# Patient Record
Sex: Female | Born: 1955 | Race: Black or African American | Hispanic: No | Smoking: Never smoker
Health system: Southern US, Community
[De-identification: ages and names within clinical notes are randomized; demographics above are authoritative.]

## PROBLEM LIST (undated history)

## (undated) DIAGNOSIS — I10 Essential (primary) hypertension: Secondary | ICD-10-CM

## (undated) DIAGNOSIS — E785 Hyperlipidemia, unspecified: Secondary | ICD-10-CM

## (undated) DIAGNOSIS — K219 Gastro-esophageal reflux disease without esophagitis: Secondary | ICD-10-CM

## (undated) DIAGNOSIS — D539 Nutritional anemia, unspecified: Secondary | ICD-10-CM

## (undated) DIAGNOSIS — E669 Obesity, unspecified: Secondary | ICD-10-CM

## (undated) HISTORY — DX: Hyperlipidemia, unspecified: E78.5

## (undated) HISTORY — DX: Essential (primary) hypertension: I10

## (undated) HISTORY — DX: Gastro-esophageal reflux disease without esophagitis: K21.9

## (undated) HISTORY — DX: Nutritional anemia, unspecified: D53.9

## (undated) HISTORY — DX: Obesity, unspecified: E66.9

---

## 2000-12-03 HISTORY — PX: SPLENECTOMY: SUR1306

## 2002-06-03 ENCOUNTER — Encounter: Payer: Self-pay | Admitting: Family Medicine

## 2002-06-03 ENCOUNTER — Ambulatory Visit (HOSPITAL_COMMUNITY): Admission: RE | Admit: 2002-06-03 | Discharge: 2002-06-03 | Payer: Self-pay | Admitting: Family Medicine

## 2003-11-05 ENCOUNTER — Ambulatory Visit (HOSPITAL_COMMUNITY): Admission: RE | Admit: 2003-11-05 | Discharge: 2003-11-05 | Payer: Self-pay | Admitting: Family Medicine

## 2003-12-04 HISTORY — PX: OTHER SURGICAL HISTORY: SHX169

## 2003-12-29 ENCOUNTER — Ambulatory Visit (HOSPITAL_COMMUNITY): Admission: RE | Admit: 2003-12-29 | Discharge: 2003-12-29 | Payer: Self-pay | Admitting: Family Medicine

## 2004-06-16 ENCOUNTER — Emergency Department (HOSPITAL_COMMUNITY): Admission: EM | Admit: 2004-06-16 | Discharge: 2004-06-16 | Payer: Self-pay | Admitting: Emergency Medicine

## 2004-10-20 ENCOUNTER — Ambulatory Visit: Payer: Self-pay | Admitting: Cardiology

## 2004-10-25 ENCOUNTER — Ambulatory Visit: Payer: Self-pay | Admitting: Family Medicine

## 2004-11-01 ENCOUNTER — Emergency Department (HOSPITAL_COMMUNITY): Admission: EM | Admit: 2004-11-01 | Discharge: 2004-11-01 | Payer: Self-pay | Admitting: Emergency Medicine

## 2004-12-26 ENCOUNTER — Ambulatory Visit: Payer: Self-pay | Admitting: Family Medicine

## 2005-01-08 ENCOUNTER — Ambulatory Visit (HOSPITAL_COMMUNITY): Admission: RE | Admit: 2005-01-08 | Discharge: 2005-01-08 | Payer: Self-pay | Admitting: Family Medicine

## 2005-02-12 ENCOUNTER — Ambulatory Visit: Payer: Self-pay | Admitting: Family Medicine

## 2005-02-21 ENCOUNTER — Ambulatory Visit: Payer: Self-pay | Admitting: Cardiology

## 2005-02-28 ENCOUNTER — Ambulatory Visit: Payer: Self-pay | Admitting: Cardiovascular Disease

## 2005-02-28 ENCOUNTER — Inpatient Hospital Stay (HOSPITAL_COMMUNITY): Admission: EM | Admit: 2005-02-28 | Discharge: 2005-03-02 | Payer: Self-pay | Admitting: Emergency Medicine

## 2005-06-18 ENCOUNTER — Ambulatory Visit: Payer: Self-pay | Admitting: Family Medicine

## 2005-06-22 ENCOUNTER — Ambulatory Visit (HOSPITAL_COMMUNITY): Admission: RE | Admit: 2005-06-22 | Discharge: 2005-06-22 | Payer: Self-pay | Admitting: Family Medicine

## 2005-06-26 ENCOUNTER — Ambulatory Visit: Payer: Self-pay | Admitting: Family Medicine

## 2005-07-23 ENCOUNTER — Ambulatory Visit: Payer: Self-pay | Admitting: Orthopedic Surgery

## 2005-09-13 ENCOUNTER — Ambulatory Visit: Payer: Self-pay | Admitting: Family Medicine

## 2005-11-06 ENCOUNTER — Ambulatory Visit: Payer: Self-pay | Admitting: Family Medicine

## 2005-12-03 HISTORY — PX: OTHER SURGICAL HISTORY: SHX169

## 2006-02-05 ENCOUNTER — Ambulatory Visit: Payer: Self-pay | Admitting: Family Medicine

## 2006-05-03 ENCOUNTER — Ambulatory Visit (HOSPITAL_COMMUNITY): Admission: RE | Admit: 2006-05-03 | Discharge: 2006-05-03 | Payer: Self-pay | Admitting: Family Medicine

## 2006-05-28 ENCOUNTER — Ambulatory Visit: Payer: Self-pay | Admitting: Family Medicine

## 2006-05-29 ENCOUNTER — Ambulatory Visit (HOSPITAL_COMMUNITY): Admission: RE | Admit: 2006-05-29 | Discharge: 2006-05-29 | Payer: Self-pay | Admitting: Family Medicine

## 2006-08-28 ENCOUNTER — Ambulatory Visit: Payer: Self-pay | Admitting: Family Medicine

## 2007-02-04 ENCOUNTER — Ambulatory Visit: Payer: Self-pay | Admitting: Family Medicine

## 2007-02-05 ENCOUNTER — Encounter: Payer: Self-pay | Admitting: Family Medicine

## 2007-02-05 LAB — CONVERTED CEMR LAB
AST: 16 units/L (ref 0–37)
Albumin: 3.6 g/dL (ref 3.5–5.2)
Alkaline Phosphatase: 85 units/L (ref 39–117)
BUN: 12 mg/dL (ref 6–23)
Bilirubin, Direct: 0.1 mg/dL (ref 0.0–0.3)
CO2: 24 meq/L (ref 19–32)
Calcium: 8.8 mg/dL (ref 8.4–10.5)
Chloride: 107 meq/L (ref 96–112)
Creatinine, Ser: 0.57 mg/dL (ref 0.40–1.20)
Eosinophils Relative: 1 % (ref 0–5)
Glucose, Bld: 105 mg/dL — ABNORMAL HIGH (ref 70–99)
HCT: 34.4 % — ABNORMAL LOW (ref 36.0–46.0)
HDL: 40 mg/dL (ref >39)
Hemoglobin: 10.7 g/dL — ABNORMAL LOW (ref 12.0–15.0)
Indirect Bilirubin: 0.1 mg/dL (ref 0.0–0.9)
LDL Cholesterol: 65 mg/dL (ref 0–99)
Lymphocytes Relative: 33 % (ref 12–46)
Lymphs Abs: 2.4 10*3/uL (ref 0.7–3.3)
Monocytes Absolute: 0.7 10*3/uL (ref 0.2–0.7)
Monocytes Relative: 10 % (ref 3–11)
Neutro Abs: 4.1 10*3/uL (ref 1.7–7.7)
RBC: 4.33 M/uL (ref 3.87–5.11)
Total Bilirubin: 0.2 mg/dL — ABNORMAL LOW (ref 0.3–1.2)

## 2007-02-06 ENCOUNTER — Encounter: Payer: Self-pay | Admitting: Family Medicine

## 2007-02-06 LAB — CONVERTED CEMR LAB
Iron: 31 ug/dL — ABNORMAL LOW (ref 42–145)
Retic Count, Absolute: 34.2 (ref 19.0–186.0)
Saturation Ratios: 9 % — ABNORMAL LOW (ref 20–55)
UIBC: 315 ug/dL

## 2007-02-07 ENCOUNTER — Ambulatory Visit (HOSPITAL_COMMUNITY): Admission: RE | Admit: 2007-02-07 | Discharge: 2007-02-07 | Payer: Self-pay | Admitting: Family Medicine

## 2007-02-21 ENCOUNTER — Ambulatory Visit (HOSPITAL_COMMUNITY): Admission: RE | Admit: 2007-02-21 | Discharge: 2007-02-21 | Payer: Self-pay | Admitting: Family Medicine

## 2007-03-05 ENCOUNTER — Encounter: Payer: Self-pay | Admitting: Family Medicine

## 2007-03-05 ENCOUNTER — Other Ambulatory Visit: Admission: RE | Admit: 2007-03-05 | Discharge: 2007-03-05 | Payer: Self-pay | Admitting: Family Medicine

## 2007-03-05 ENCOUNTER — Ambulatory Visit: Payer: Self-pay | Admitting: Family Medicine

## 2007-03-05 ENCOUNTER — Encounter (INDEPENDENT_AMBULATORY_CARE_PROVIDER_SITE_OTHER): Payer: Self-pay | Admitting: *Deleted

## 2007-03-06 ENCOUNTER — Encounter: Payer: Self-pay | Admitting: Family Medicine

## 2007-03-06 LAB — CONVERTED CEMR LAB
Chlamydia, DNA Probe: NEGATIVE
GC Probe Amp, Genital: NEGATIVE
Gardnerella vaginalis: NEGATIVE
Trichomonal Vaginitis: NEGATIVE

## 2007-03-07 ENCOUNTER — Ambulatory Visit (HOSPITAL_COMMUNITY): Admission: RE | Admit: 2007-03-07 | Discharge: 2007-03-07 | Payer: Self-pay | Admitting: Otolaryngology

## 2007-05-13 ENCOUNTER — Ambulatory Visit: Payer: Self-pay | Admitting: Family Medicine

## 2007-05-13 LAB — CONVERTED CEMR LAB
Folate: >20 ng/mL
Hemoglobin: 11.5 g/dL — ABNORMAL LOW (ref 12.0–15.0)
Iron: 84 ug/dL (ref 42–145)
Lymphs Abs: 3.4 10*3/uL — ABNORMAL HIGH (ref 0.7–3.3)
Monocytes Relative: 9 % (ref 3–11)
Neutro Abs: 5.5 10*3/uL (ref 1.7–7.7)
Neutrophils Relative %: 55 % (ref 43–77)
RBC: 4.52 M/uL (ref 3.87–5.11)
Retic Ct Pct: 0.8 % (ref 0.4–3.1)
Vitamin B-12: 843 pg/mL (ref 211–911)
WBC: 9.9 10*3/uL (ref 4.0–10.5)

## 2007-08-13 ENCOUNTER — Ambulatory Visit: Payer: Self-pay | Admitting: Family Medicine

## 2007-08-13 LAB — CONVERTED CEMR LAB
Basophils Absolute: 0 10*3/uL (ref 0.0–0.1)
Basophils Relative: 0 % (ref 0–1)
CO2: 23 meq/L (ref 19–32)
Calcium: 8.8 mg/dL (ref 8.4–10.5)
Creatinine, Ser: 0.66 mg/dL (ref 0.40–1.20)
Eosinophils Relative: 1 % (ref 0–5)
HCT: 34.9 % — ABNORMAL LOW (ref 36.0–46.0)
Hemoglobin: 11.4 g/dL — ABNORMAL LOW (ref 12.0–15.0)
Iron: 93 ug/dL (ref 42–145)
Lymphocytes Relative: 28 % (ref 12–46)
MCHC: 32.7 g/dL (ref 30.0–36.0)
MCV: 80.4 fL (ref 78.0–100.0)
Monocytes Absolute: 0.9 10*3/uL — ABNORMAL HIGH (ref 0.2–0.7)
RDW: 14.4 % — ABNORMAL HIGH (ref 11.5–14.0)
Retic Count, Absolute: 30.4 (ref 19.0–186.0)
Retic Ct Pct: 0.7 % (ref 0.4–3.1)
TIBC: 386 ug/dL (ref 250–470)
UIBC: 293 ug/dL

## 2007-10-31 IMAGING — US US SOFT TISSUE HEAD/NECK
1 series · 13 of 25 positions shown · non-contrast
Comparison: none

HISTORY: Reevaluate right thyroid nodules

[Series 1: us soft tissue head/neck · 0.09mm/px · 13 of 44 slices shown]
[im 1/44]
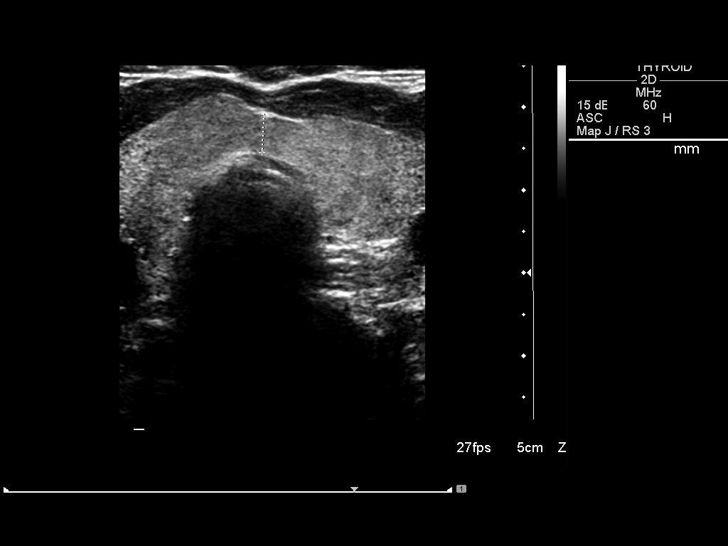
[im 4/44]
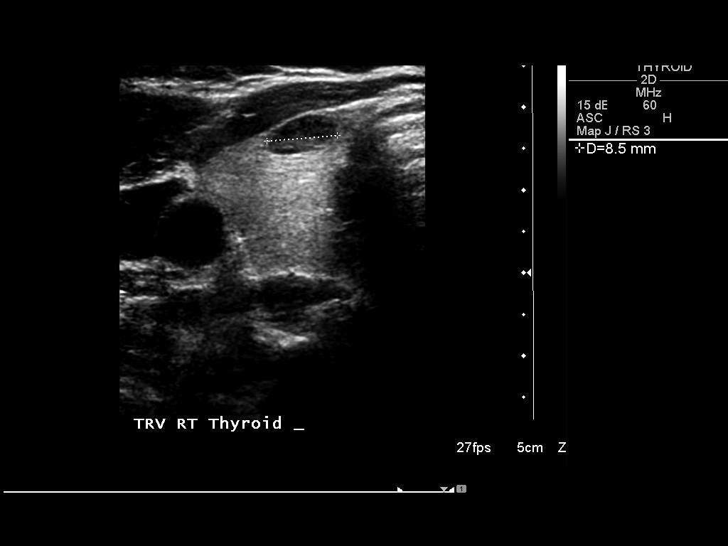
[im 8/44]
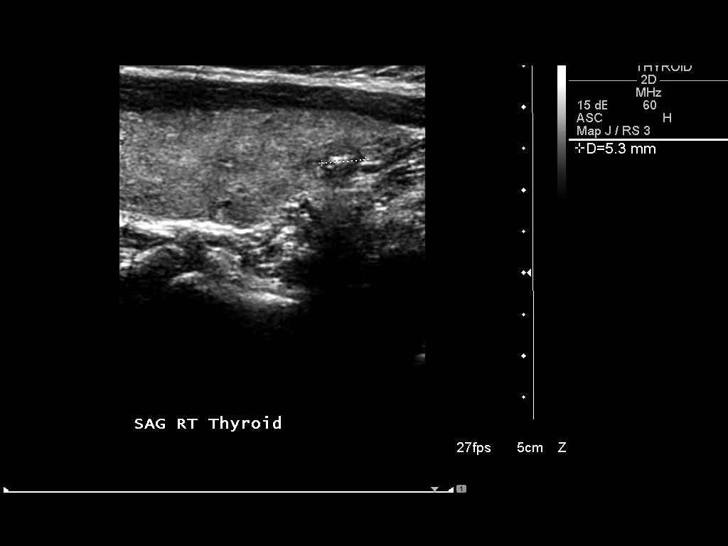
[im 11/44]
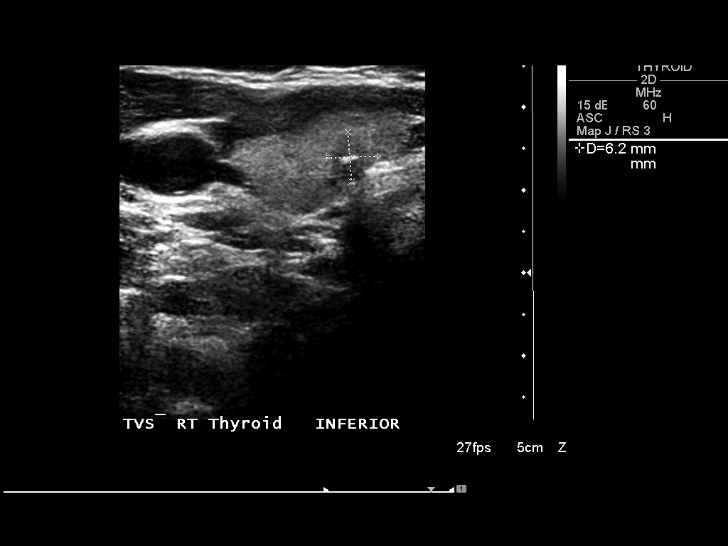
[im 15/44]
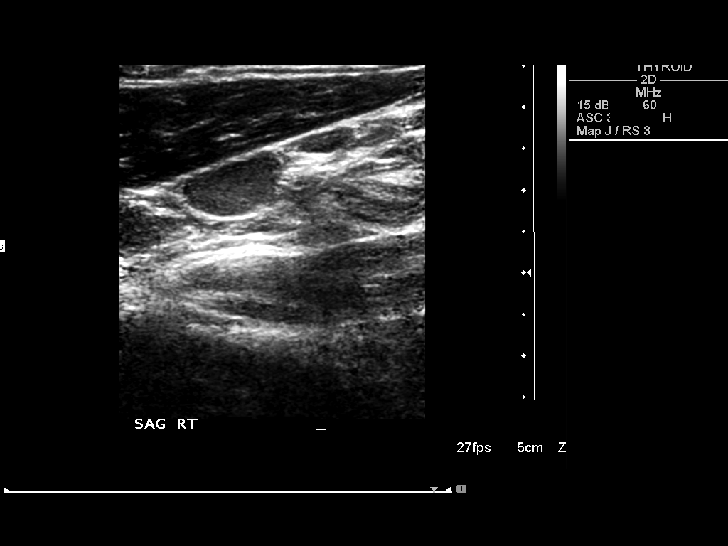
[im 18/44]
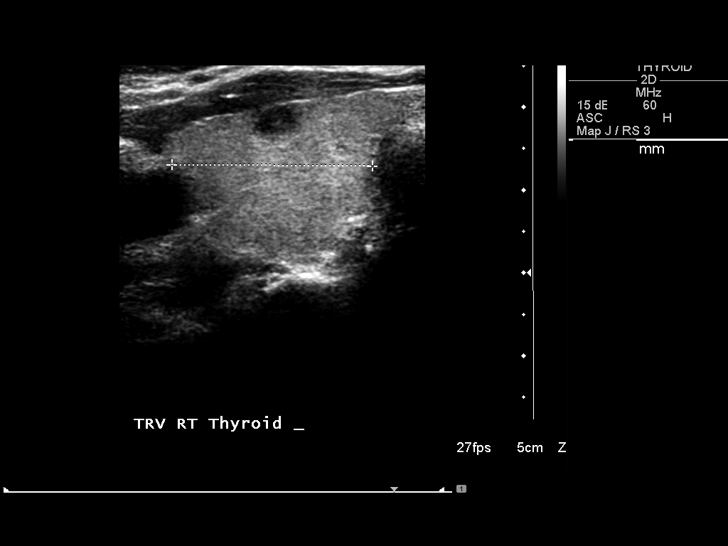
[im 22/44]
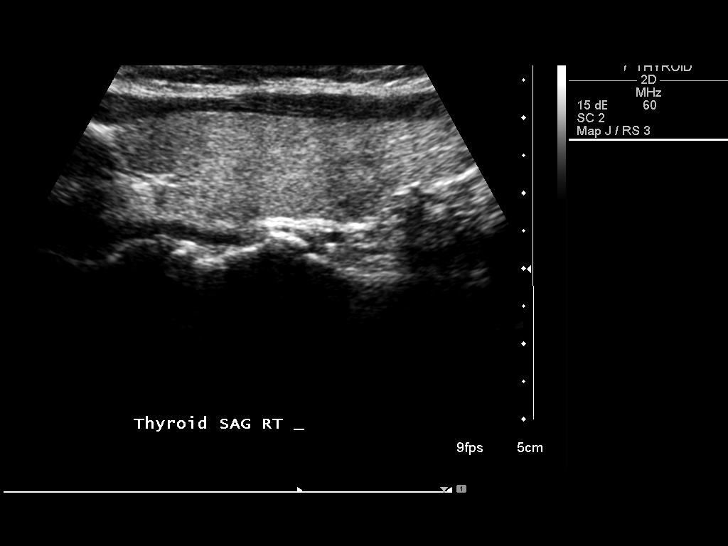
[im 26/44]
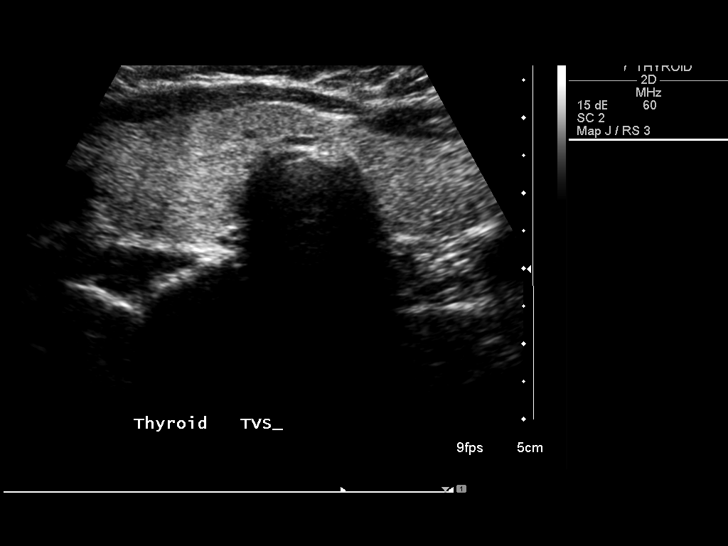
[im 29/44]
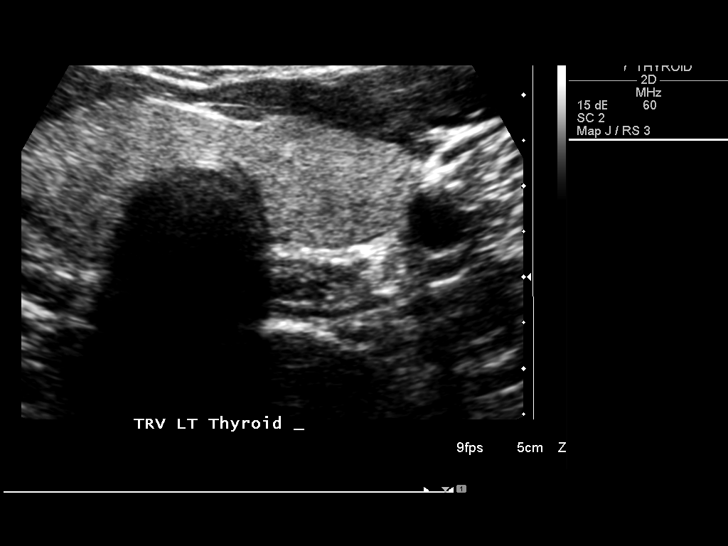
[im 33/44]
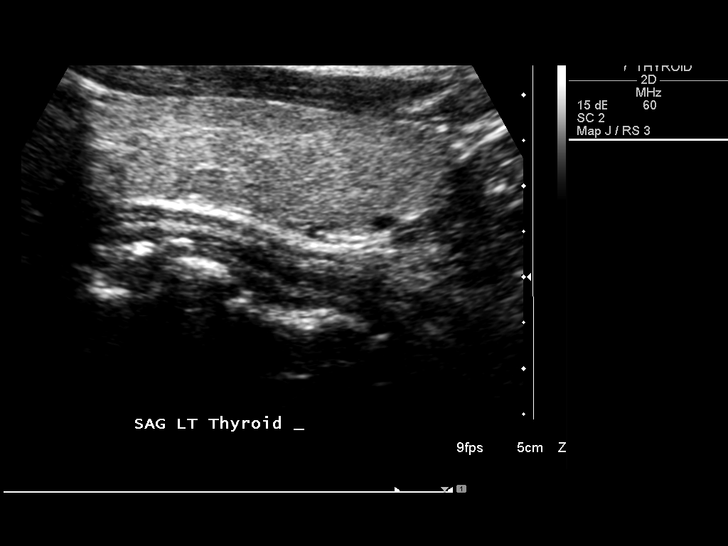
[im 36/44]
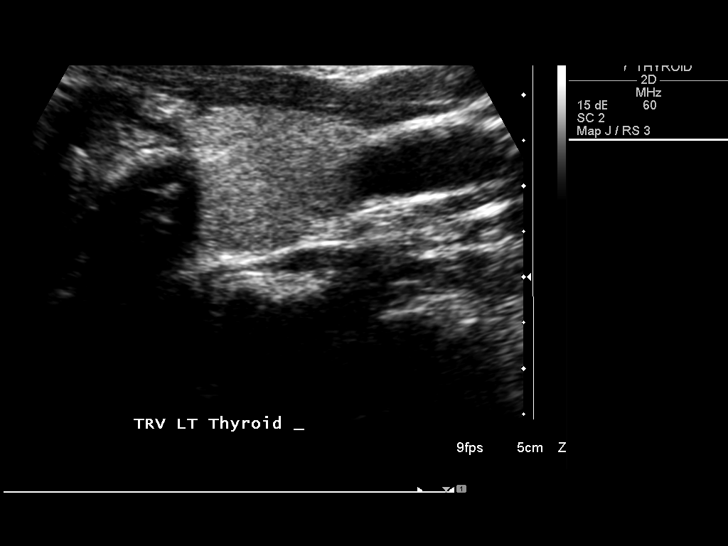
[im 40/44]
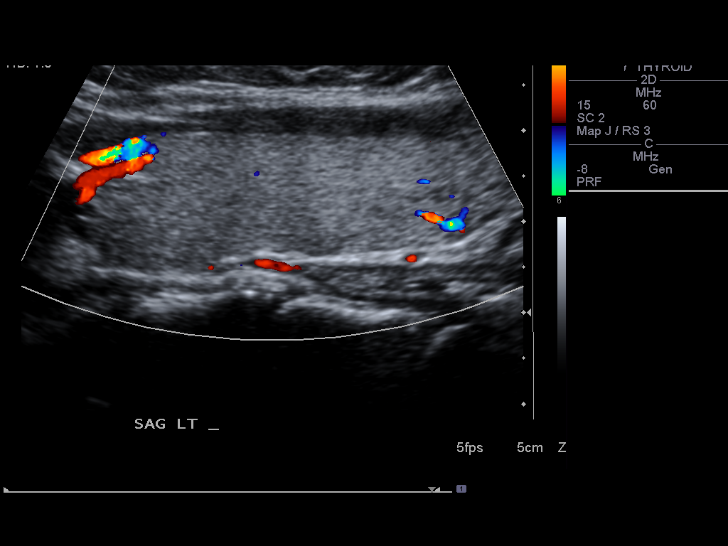
[im 44/44]
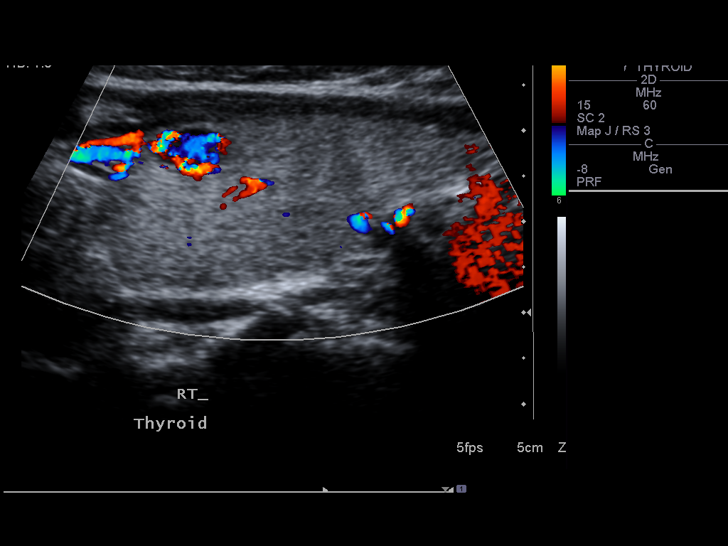

[13 of 25 positions shown; findings below may reference images not displayed]

THYROID ULTRASOUND:

Sonography of thyroid gland compared to 05/29/2006.

Right thyroid lobe 5.0 cm length x 1.9 cm AP x 2.4 cm transverse.
Left thyroid lobe 4.4 cm length x 1.4 cm AP x 1.9 cm transverse.
Thyroid isthmus 5 mm thick.
Thyroid echogenicity fairly homogeneous.
Small hypervascular nodule identified right thyroid lobe, 8 x 4 x 9 mm in size.
This appears relatively stable in size and echogenicity when compared to
previous exam.
Nodule remains hypervascular color Doppler imaging.

Additional tiny nodules noted in mid and inferior right thyroid lobe, 4 and 6 mm
in greatest sizes.
The 4 mm nodule appears stable at mid portion.
A 6 mm nodule, located at the inferior margin of the right lobe, was not
identified on the previous study though in retrospect there is questionably a
nodule at this site on a single image from the prior exam.
No left thyroid nodules identified.
No regional adenopathy.
IMPRESSION: 9 mm diameter hypervascular nodule right thyroid lobe, not significantly
changed.
4 mm diameter hypoechoic nodule right lobe, no significant change.
6 mm diameter hypoechoic nodule with small hyperechoic central focus noted at
inferior margin of right thyroid lobe, not identified on previous exam but in
retrospect questionably present on a single image.
Continued surveillance imaging recommended to establish long-term stability.

## 2007-11-18 ENCOUNTER — Ambulatory Visit: Payer: Self-pay | Admitting: Family Medicine

## 2007-11-19 ENCOUNTER — Encounter: Payer: Self-pay | Admitting: Family Medicine

## 2007-12-04 ENCOUNTER — Encounter: Payer: Self-pay | Admitting: Family Medicine

## 2008-01-13 ENCOUNTER — Ambulatory Visit (HOSPITAL_COMMUNITY): Admission: RE | Admit: 2008-01-13 | Discharge: 2008-01-13 | Payer: Self-pay | Admitting: General Surgery

## 2008-02-17 ENCOUNTER — Ambulatory Visit: Payer: Self-pay | Admitting: Family Medicine

## 2008-02-27 ENCOUNTER — Encounter (INDEPENDENT_AMBULATORY_CARE_PROVIDER_SITE_OTHER): Payer: Self-pay | Admitting: *Deleted

## 2008-02-27 DIAGNOSIS — J301 Allergic rhinitis due to pollen: Secondary | ICD-10-CM | POA: Insufficient documentation

## 2008-02-27 DIAGNOSIS — I10 Essential (primary) hypertension: Secondary | ICD-10-CM | POA: Insufficient documentation

## 2008-02-27 DIAGNOSIS — E669 Obesity, unspecified: Secondary | ICD-10-CM | POA: Insufficient documentation

## 2008-02-27 DIAGNOSIS — D509 Iron deficiency anemia, unspecified: Secondary | ICD-10-CM | POA: Insufficient documentation

## 2008-02-27 DIAGNOSIS — E1165 Type 2 diabetes mellitus with hyperglycemia: Secondary | ICD-10-CM | POA: Insufficient documentation

## 2008-02-27 DIAGNOSIS — E119 Type 2 diabetes mellitus without complications: Secondary | ICD-10-CM | POA: Insufficient documentation

## 2008-02-27 DIAGNOSIS — K219 Gastro-esophageal reflux disease without esophagitis: Secondary | ICD-10-CM

## 2008-02-27 HISTORY — DX: Allergic rhinitis due to pollen: J30.1

## 2008-02-27 HISTORY — DX: Gastro-esophageal reflux disease without esophagitis: K21.9

## 2008-07-16 ENCOUNTER — Telehealth: Payer: Self-pay | Admitting: Family Medicine

## 2008-08-03 ENCOUNTER — Telehealth: Payer: Self-pay | Admitting: Family Medicine

## 2008-08-13 ENCOUNTER — Encounter: Payer: Self-pay | Admitting: Family Medicine

## 2008-09-01 ENCOUNTER — Ambulatory Visit: Payer: Self-pay | Admitting: Family Medicine

## 2008-09-01 LAB — CONVERTED CEMR LAB
Glucose, Bld: 132 mg/dL
Hgb A1c MFr Bld: 6.8 %

## 2008-11-09 ENCOUNTER — Encounter: Payer: Self-pay | Admitting: Family Medicine

## 2008-11-11 ENCOUNTER — Telehealth: Payer: Self-pay | Admitting: Family Medicine

## 2008-11-11 ENCOUNTER — Encounter: Payer: Self-pay | Admitting: Family Medicine

## 2008-12-28 ENCOUNTER — Ambulatory Visit: Payer: Self-pay | Admitting: Family Medicine

## 2008-12-28 ENCOUNTER — Telehealth: Payer: Self-pay | Admitting: Family Medicine

## 2008-12-28 LAB — CONVERTED CEMR LAB
BUN: 9 mg/dL (ref 6–23)
Chloride: 106 meq/L (ref 96–112)
Eosinophils Absolute: 0.1 10*3/uL (ref 0.0–0.7)
Glucose, Bld: 227 mg/dL
Glucose, Bld: 177 mg/dL — ABNORMAL HIGH (ref 70–99)
Hgb A1c MFr Bld: 7.2 %
Lymphocytes Relative: 23 % (ref 12–46)
Lymphs Abs: 2.7 10*3/uL (ref 0.7–4.0)
Monocytes Relative: 11 % (ref 3–12)
Neutro Abs: 7.5 10*3/uL (ref 1.7–7.7)
Neutrophils Relative %: 65 % (ref 43–77)
Platelets: 300 10*3/uL (ref 150–400)
Potassium: 4.1 meq/L (ref 3.5–5.3)
RBC: 4.83 M/uL (ref 3.87–5.11)
Sodium: 139 meq/L (ref 135–145)
TSH: 2.191 microintl units/mL (ref 0.350–4.50)
WBC: 11.5 10*3/uL — ABNORMAL HIGH (ref 4.0–10.5)

## 2008-12-29 ENCOUNTER — Encounter: Payer: Self-pay | Admitting: Family Medicine

## 2009-01-03 LAB — CONVERTED CEMR LAB
Candida species: NEGATIVE
Gardnerella vaginalis: POSITIVE — AB
Trichomonal Vaginitis: NEGATIVE

## 2009-03-23 ENCOUNTER — Encounter: Payer: Self-pay | Admitting: Family Medicine

## 2009-03-29 ENCOUNTER — Encounter: Payer: Self-pay | Admitting: Family Medicine

## 2009-05-18 ENCOUNTER — Other Ambulatory Visit: Admission: RE | Admit: 2009-05-18 | Discharge: 2009-05-18 | Payer: Self-pay | Admitting: Family Medicine

## 2009-05-18 ENCOUNTER — Ambulatory Visit: Payer: Self-pay | Admitting: Family Medicine

## 2009-05-18 ENCOUNTER — Encounter: Payer: Self-pay | Admitting: Family Medicine

## 2009-05-18 DIAGNOSIS — M79609 Pain in unspecified limb: Secondary | ICD-10-CM | POA: Insufficient documentation

## 2009-05-18 DIAGNOSIS — N938 Other specified abnormal uterine and vaginal bleeding: Secondary | ICD-10-CM | POA: Insufficient documentation

## 2009-05-18 DIAGNOSIS — N949 Unspecified condition associated with female genital organs and menstrual cycle: Secondary | ICD-10-CM

## 2009-05-18 LAB — CONVERTED CEMR LAB
Glucose, Bld: 219 mg/dL
OCCULT 1: NEGATIVE

## 2009-05-19 ENCOUNTER — Encounter: Payer: Self-pay | Admitting: Family Medicine

## 2009-05-19 LAB — CONVERTED CEMR LAB
Candida species: POSITIVE — AB
Chlamydia, DNA Probe: NEGATIVE
Gardnerella vaginalis: POSITIVE — AB
LDL Cholesterol: 54 mg/dL (ref 0–99)
Microalb Creat Ratio: 20 mg/g (ref 0.0–30.0)
Trichomonal Vaginitis: NEGATIVE

## 2009-05-25 ENCOUNTER — Ambulatory Visit (HOSPITAL_COMMUNITY): Admission: RE | Admit: 2009-05-25 | Discharge: 2009-05-25 | Payer: Self-pay | Admitting: Family Medicine

## 2009-07-04 ENCOUNTER — Encounter: Payer: Self-pay | Admitting: Family Medicine

## 2009-07-25 ENCOUNTER — Encounter: Payer: Self-pay | Admitting: Family Medicine

## 2009-08-17 ENCOUNTER — Telehealth: Payer: Self-pay | Admitting: Family Medicine

## 2009-08-24 ENCOUNTER — Ambulatory Visit: Payer: Self-pay | Admitting: Family Medicine

## 2009-08-24 LAB — CONVERTED CEMR LAB
Chloride: 102 meq/L (ref 96–112)
Glucose, Bld: 144 mg/dL
Potassium: 4.2 meq/L (ref 3.5–5.3)
Sodium: 140 meq/L (ref 135–145)

## 2009-09-07 ENCOUNTER — Telehealth: Payer: Self-pay | Admitting: Family Medicine

## 2009-09-13 ENCOUNTER — Encounter: Payer: Self-pay | Admitting: Family Medicine

## 2009-10-17 ENCOUNTER — Encounter: Payer: Self-pay | Admitting: Family Medicine

## 2009-11-04 ENCOUNTER — Ambulatory Visit: Payer: Self-pay | Admitting: Family Medicine

## 2009-11-04 LAB — CONVERTED CEMR LAB: Blood Glucose, Fasting: 192 mg/dL

## 2009-12-22 ENCOUNTER — Ambulatory Visit: Payer: Self-pay | Admitting: Family Medicine

## 2009-12-22 LAB — CONVERTED CEMR LAB
Bilirubin Urine: NEGATIVE
Blood in Urine, dipstick: NEGATIVE
Glucose, Urine, Semiquant: NEGATIVE
Protein, U semiquant: NEGATIVE
Specific Gravity, Urine: 1.025
pH: 6.5

## 2009-12-27 ENCOUNTER — Encounter: Payer: Self-pay | Admitting: Family Medicine

## 2009-12-29 ENCOUNTER — Ambulatory Visit: Payer: Self-pay | Admitting: Family Medicine

## 2010-03-03 ENCOUNTER — Emergency Department (HOSPITAL_COMMUNITY): Admission: EM | Admit: 2010-03-03 | Discharge: 2010-03-03 | Payer: Self-pay | Admitting: Emergency Medicine

## 2010-03-03 ENCOUNTER — Ambulatory Visit: Payer: Self-pay | Admitting: Internal Medicine

## 2010-03-08 ENCOUNTER — Ambulatory Visit: Payer: Self-pay | Admitting: Family Medicine

## 2010-03-13 ENCOUNTER — Encounter: Payer: Self-pay | Admitting: Family Medicine

## 2010-03-13 LAB — CONVERTED CEMR LAB
ALT: 25 units/L (ref 0–35)
Basophils Absolute: 0 10*3/uL (ref 0.0–0.1)
Bilirubin, Direct: 0.1 mg/dL (ref 0.0–0.3)
Chloride: 101 meq/L (ref 96–112)
Cholesterol: 166 mg/dL (ref 0–200)
Glucose, Bld: 120 mg/dL — ABNORMAL HIGH (ref 70–99)
Hemoglobin: 12.3 g/dL (ref 12.0–15.0)
Lymphocytes Relative: 33 % (ref 12–46)
Lymphs Abs: 3.1 10*3/uL (ref 0.7–4.0)
Monocytes Absolute: 0.7 10*3/uL (ref 0.1–1.0)
Monocytes Relative: 8 % (ref 3–12)
Neutro Abs: 5.5 10*3/uL (ref 1.7–7.7)
Potassium: 3.8 meq/L (ref 3.5–5.3)
RBC: 4.84 M/uL (ref 3.87–5.11)
RDW: 15.8 % — ABNORMAL HIGH (ref 11.5–15.5)
Sodium: 139 meq/L (ref 135–145)
TSH: 1.831 microintl units/mL (ref 0.350–4.500)
Total CHOL/HDL Ratio: 4.5
Total Protein: 7.4 g/dL (ref 6.0–8.3)
VLDL: 29 mg/dL (ref 0–40)
WBC: 9.4 10*3/uL (ref 4.0–10.5)

## 2010-03-14 LAB — CONVERTED CEMR LAB: Hgb A1c MFr Bld: 7.2 % — ABNORMAL HIGH (ref 4.6–6.1)

## 2010-03-31 ENCOUNTER — Ambulatory Visit (HOSPITAL_COMMUNITY): Admission: RE | Admit: 2010-03-31 | Discharge: 2010-03-31 | Payer: Self-pay | Admitting: Family Medicine

## 2010-04-12 ENCOUNTER — Ambulatory Visit (HOSPITAL_COMMUNITY): Admission: RE | Admit: 2010-04-12 | Discharge: 2010-04-12 | Payer: Self-pay | Admitting: Family Medicine

## 2010-05-22 ENCOUNTER — Ambulatory Visit: Payer: Self-pay | Admitting: Family Medicine

## 2010-05-22 DIAGNOSIS — E782 Mixed hyperlipidemia: Secondary | ICD-10-CM | POA: Insufficient documentation

## 2010-05-22 DIAGNOSIS — E785 Hyperlipidemia, unspecified: Secondary | ICD-10-CM | POA: Insufficient documentation

## 2010-05-22 DIAGNOSIS — L259 Unspecified contact dermatitis, unspecified cause: Secondary | ICD-10-CM | POA: Insufficient documentation

## 2010-05-22 DIAGNOSIS — J019 Acute sinusitis, unspecified: Secondary | ICD-10-CM | POA: Insufficient documentation

## 2010-06-12 ENCOUNTER — Ambulatory Visit: Payer: Self-pay | Admitting: Family Medicine

## 2010-06-12 DIAGNOSIS — D4959 Neoplasm of unspecified behavior of other genitourinary organ: Secondary | ICD-10-CM | POA: Insufficient documentation

## 2010-06-12 DIAGNOSIS — R928 Other abnormal and inconclusive findings on diagnostic imaging of breast: Secondary | ICD-10-CM | POA: Insufficient documentation

## 2010-06-12 LAB — HM DIABETES FOOT EXAM

## 2010-06-13 ENCOUNTER — Encounter: Payer: Self-pay | Admitting: Family Medicine

## 2010-06-13 LAB — CONVERTED CEMR LAB
CO2: 25 meq/L (ref 19–32)
Calcium: 9.3 mg/dL (ref 8.4–10.5)
Creatinine, Ser: 0.65 mg/dL (ref 0.40–1.20)
Glucose, Bld: 190 mg/dL — ABNORMAL HIGH (ref 70–99)
Hgb A1c MFr Bld: 7.5 % — ABNORMAL HIGH (ref <5.7)
Microalb Creat Ratio: 15.9 mg/g (ref 0.0–30.0)

## 2010-06-15 ENCOUNTER — Encounter: Payer: Self-pay | Admitting: Family Medicine

## 2010-07-07 ENCOUNTER — Encounter: Admission: RE | Admit: 2010-07-07 | Discharge: 2010-07-07 | Payer: Self-pay | Admitting: Family Medicine

## 2010-07-11 ENCOUNTER — Encounter: Payer: Self-pay | Admitting: Family Medicine

## 2010-07-14 ENCOUNTER — Ambulatory Visit (HOSPITAL_COMMUNITY)
Admission: RE | Admit: 2010-07-14 | Discharge: 2010-07-14 | Payer: Self-pay | Source: Home / Self Care | Admitting: Family Medicine

## 2010-07-18 ENCOUNTER — Encounter: Payer: Self-pay | Admitting: Family Medicine

## 2010-08-17 ENCOUNTER — Telehealth: Payer: Self-pay | Admitting: Family Medicine

## 2010-08-18 ENCOUNTER — Telehealth: Payer: Self-pay | Admitting: Family Medicine

## 2010-11-21 ENCOUNTER — Ambulatory Visit: Payer: Self-pay | Admitting: Family Medicine

## 2010-11-21 DIAGNOSIS — N39498 Other specified urinary incontinence: Secondary | ICD-10-CM | POA: Insufficient documentation

## 2010-11-21 LAB — CONVERTED CEMR LAB
CO2: 28 meq/L (ref 19–32)
Chloride: 103 meq/L (ref 96–112)
Sodium: 141 meq/L (ref 135–145)

## 2010-11-28 DIAGNOSIS — C50919 Malignant neoplasm of unspecified site of unspecified female breast: Secondary | ICD-10-CM | POA: Insufficient documentation

## 2010-11-30 ENCOUNTER — Emergency Department (HOSPITAL_COMMUNITY)
Admission: EM | Admit: 2010-11-30 | Discharge: 2010-11-30 | Payer: Self-pay | Source: Home / Self Care | Admitting: Emergency Medicine

## 2010-12-23 ENCOUNTER — Encounter: Payer: Self-pay | Admitting: Family Medicine

## 2010-12-24 ENCOUNTER — Encounter: Payer: Self-pay | Admitting: Family Medicine

## 2010-12-25 ENCOUNTER — Encounter: Payer: Self-pay | Admitting: Family Medicine

## 2011-01-02 NOTE — Letter (Signed)
Summary: xray  xray   Imported By: Curtis Sites 05/08/2010 14:18:51  _____________________________________________________________________  External Attachment:    Type:   Image     Comment:   External Document

## 2011-01-02 NOTE — Progress Notes (Signed)
Summary: STRIPS AND NEEDLES  Phone Note Call from Patient   Summary of Call: NEEDS STRIPS AND NEEDLES FOR BAYER MACHINE SEND TO [facility] Initial call taken by: Lind Guest,  August 17, 2010 2:46 PM    New/Updated Medications: * STRIPS AND LANCETS for once daily testing Prescriptions: STRIPS AND LANCETS for once daily testing  #36month x 3   Entered by:   Everitt Amber LPN   Authorized by:   Syliva Overman MD   Signed by:   Everitt Amber LPN on 16/09/9603   Method used:   Printed then faxed to ...       [facility] [address] (retail)       [address]       [place], [place]  [address]       Ph: [phone] or [phone]       Fax: [phone]   RxID:   [id number]

## 2011-01-02 NOTE — Miscellaneous (Signed)
  Clinical Lists Changes  Medications: Added new medication of ACYCLOVIR 800 MG TABS (ACYCLOVIR) one tablet five times daily - Signed Rx of ACYCLOVIR 800 MG TABS (ACYCLOVIR) one tablet five times daily;  #50 x 0;  Signed;  Entered by: Syliva Overman MD;  Authorized by: Syliva Overman MD;  Method used: Electronically to [facility] [address], [place], [place], [place]  [address], Ph: [phone] or [phone], Fax: [phone]    Prescriptions: ACYCLOVIR 800 MG TABS (ACYCLOVIR) one tablet five times daily  #50 x 0   Entered and Authorized by:   Syliva Overman MD   Signed by:   Syliva Overman MD on 06/15/2010   Method used:   Electronically to        [facility] [address] (retail)       [address]       [place], [place]  [address]       Ph: [phone] or [phone]       Fax: [phone]   RxID:   [id number]

## 2011-01-02 NOTE — Letter (Signed)
Summary: consult  consult   Imported By: Curtis Sites 05/08/2010 14:17:13  _____________________________________________________________________  External Attachment:    Type:   Image     Comment:   External Document

## 2011-01-02 NOTE — Assessment & Plan Note (Signed)
Summary: sick and rash- room 1   Vital Signs:  Patient profile:   55 year old female Menstrual status:  regular Height:      63.5 inches Weight:      203.50 pounds BMI:     35.61 O2 Sat:      95 % on Room air Pulse rate:   98 / minute Resp:     16 per minute BP sitting:   104 / 64  (left arm)  Vitals Entered By: Adella Hare LPN (May 22, 2010 2:38 PM) CC: sinus congestion, sore throat, rash on back of leg Is Patient Diabetic? Yes Did you bring your meter with you today? No Pain Assessment Patient in pain? no        Primary Provider:  Syliva Overman MD  CC:  sinus congestion, sore throat, and rash on back of leg.  History of Present Illness: Pt presents today with c/o nasal congestion and sinus pressure x 5 days.  Her symptoms are worsening.  She has had some cough too.  No chest congestion and cough is nonprod.  She had a sore throat at onset.  No fever or chills.  She states her nasal mucus is thick and discolored.  Pt also has had a rash on the back of her Rt leg x 1 week. This is itchy and is getting larger.  She initially thought it might be a bite.  She has tried over the counter diphenhydramine cream and cortisone cream without improvement.  The pharmacist told her to see her dr regarding this.  Pt has a hx of seasonal allergies.  She denies any current allergy syptoms. Pt also has a hx of DM.  She has an appt in July for her check up.  She needs labs ordered.   Current Medications (verified): 1)  Ferrous Sulfate 325 (65 Fe) Mg  Tabs (Ferrous Sulfate) .... One Tab By Mouth Once Daily 2)  Ramipril 5 Mg  Caps (Ramipril) .... One Cap By Mouth Once Daily 3)  Aspirin Adult Low Strength 81 Mg Tbec (Aspirin) .... Take One Tab Once Daily 4)  Fluticasone Propionate 50 Mcg/act Susp (Fluticasone Propionate) .... Two Puffs Per Nostril Qd 5)  Fish Oil 1000 Mg Caps (Omega-3 Fatty Acids) .... Take 1 Tablet By Mouth Two Times A Day 6)  Onetouch Ultra Test  Strp (Glucose Blood)  .... Once Daily Testing 7)  Metformin Hcl 500 Mg Tabs (Metformin Hcl) .... 2 Tablets Twice Daily 8)  Glipizide 5 Mg Xr24h-Tab (Glipizide) .... Take 1 Tablet By Mouth Once A Day 9)  Sertraline Hcl 25 Mg Tabs (Sertraline Hcl) .... Take 1 Tablet By Mouth Once A Day 10)  Omeprazole 20 Mg Cpdr (Omeprazole) .... Take 1 Tablet By Mouth Once A Day 11)  Hydrocortisone Butyrate 0.1 % Crea (Hydrocortisone Butyrate) .... Apply Twice Daily As Needed To Affected Area(S)  Allergies (verified): 1)  ! Codeine  Past History:  Past medical history reviewed for relevance to current acute and chronic problems.  Past Medical History: Reviewed history from 02/27/2008 and no changes required. Current Problems:  ALLERGIC RHINITIS, SEASONAL (ICD-477.0) ANEMIA, IRON DEFICIENCY (ICD-280.9) GERD (ICD-530.81) OBESITY, UNSPECIFIED (ICD-278.00) DIABETES MELLITUS, TYPE II (ICD-250.00) HYPERTENSION (ICD-401.9)  Review of Systems General:  Denies chills and fever. ENT:  Complains of nasal congestion, postnasal drainage, sinus pressure, and sore throat; denies earache. CV:  Denies chest pain or discomfort and palpitations. Resp:  Complains of cough; denies shortness of breath and sputum productive. Allergy:  Complains of  seasonal allergies.  Physical Exam  General:  Well-developed,well-nourished,in no acute distress; alert,appropriate and cooperative throughout examination Head:  Normocephalic and atraumatic without obvious abnormalities. No apparent alopecia or balding. Ears:  External ear exam shows no significant lesions or deformities.  Otoscopic examination reveals clear canals, tympanic membranes are intact bilaterally without bulging, retraction, inflammation or discharge. Hearing is grossly normal bilaterally. Nose:  External nasal examination shows no deformity or inflammation. Nasal mucosa are pink and moist without lesions or exudates.  Bilat maxillary sinus TTP noted. Mouth:  Oral mucosa and oropharynx  without lesions or exudates.  Neck:  No deformities, masses, or tenderness noted. Lungs:  Normal respiratory effort, chest expands symmetrically. Lungs are clear to auscultation, no crackles or wheezes. Heart:  Normal rate and regular rhythm. S1 and S2 normal without gallop, murmur, click, rub or other extra sounds. Skin:  Posterior R lower leg approx 3 cm area of grouped erythematous papules.  No vesicles or pustules.  No peeling or flaking. Cervical Nodes:  No lymphadenopathy noted Psych:  Cognition and judgment appear intact. Alert and cooperative with normal attention span and concentration. No apparent delusions, illusions, hallucinations   Impression & Recommendations:  Problem # 1:  SINUSITIS, ACUTE (ICD-461.9) Assessment New  Her updated medication list for this problem includes:    Fluticasone Propionate 50 Mcg/act Susp (Fluticasone propionate) .Marland Kitchen..Marland Kitchen Two puffs per nostril qd    Benzonatate 100 Mg Caps (Benzonatate) .Marland Kitchen... Take 1 every 8 hrs as needed for cough    Cephalexin 500 Mg Tabs (Cephalexin) .Marland Kitchen... Take 1 three times a day for 10 days    Doxycycline Hyclate 100 Mg Solr (Doxycycline hyclate) .Marland Kitchen... Take 1 two times a day for 10 days  Problem # 2:  CONTACT DERMATITIS (ICD-692.9) Assessment: New  Her updated medication list for this problem includes:    Hydrocortisone Butyrate 0.1 % Crea (Hydrocortisone butyrate) .Marland Kitchen... Apply twice daily as needed to affected area(s)  Orders: Depo- Medrol 80mg  (J1040) Admin of Therapeutic Inj  intramuscular or subcutaneous (15400)  Problem # 3:  DIABETES MELLITUS, TYPE II (ICD-250.00)  Her updated medication list for this problem includes:    Ramipril 5 Mg Caps (Ramipril) ..... One cap by mouth once daily    Aspirin Adult Low Strength 81 Mg Tbec (Aspirin) .Marland Kitchen... Take one tab once daily    Metformin Hcl 500 Mg Tabs (Metformin hcl) .Marland Kitchen... 2 tablets twice daily    Glipizide 5 Mg Xr24h-tab (Glipizide) .Marland Kitchen... Take 1 tablet by mouth once a  day  Orders: T- Hemoglobin A1C ([id number]) T-Urine Microalbumin w/creat. ratio [phone]) T-Comprehensive Metabolic Panel [phone])  Complete Medication List: 1)  Ferrous Sulfate 325 (65 Fe) Mg Tabs (Ferrous sulfate) .... One tab by mouth once daily 2)  Ramipril 5 Mg Caps (Ramipril) .... One cap by mouth once daily 3)  Aspirin Adult Low Strength 81 Mg Tbec (Aspirin) .... Take one tab once daily 4)  Fluticasone Propionate 50 Mcg/act Susp (Fluticasone propionate) .... Two puffs per nostril qd 5)  Fish Oil 1000 Mg Caps (Omega-3 fatty acids) .... Take 1 tablet by mouth two times a day 6)  Onetouch Ultra Test Strp (Glucose blood) .... Once daily testing 7)  Metformin Hcl 500 Mg Tabs (Metformin hcl) .... 2 tablets twice daily 8)  Glipizide 5 Mg Xr24h-tab (Glipizide) .... Take 1 tablet by mouth once a day 9)  Sertraline Hcl 25 Mg Tabs (Sertraline hcl) .... Take 1 tablet by mouth once a day 10)  Omeprazole 20 Mg Cpdr (Omeprazole) .Marland KitchenMarland KitchenMarland Kitchen  Take 1 tablet by mouth once a day 11)  Hydrocortisone Butyrate 0.1 % Crea (Hydrocortisone butyrate) .... Apply twice daily as needed to affected area(s) 12)  Benzonatate 100 Mg Caps (Benzonatate) .... Take 1 every 8 hrs as needed for cough 13)  Cephalexin 500 Mg Tabs (Cephalexin) .... Take 1 three times a day for 10 days 14)  Doxycycline Hyclate 100 Mg Solr (Doxycycline hyclate) .... Take 1 two times a day for 10 days  Other Orders: T-Lipid Profile [phone]) T-Vitamin D (25-Hydroxy) [phone])  Patient Instructions: 1)  Keep your next appt with Dr Lodema Hong 2)  I have ordered blood work for you to have drawn before your next appt. 3)  I have prescribed an antibiotic, cough medicine, and a cream for your rash. Prescriptions: DOXYCYCLINE HYCLATE 100 MG SOLR (DOXYCYCLINE HYCLATE) take 1 two times a day for 10 days  #20 x 0   Entered and Authorized by:   Esperanza Sheets PA   Signed by:   Esperanza Sheets PA on 05/22/2010   Method used:    Electronically to        [facility] [address] (retail)       [address]       [place], [place]  [address]       Ph: [phone] or [phone]       Fax: [phone]   RxID:   [id number] HYDROCORTISONE BUTYRATE 0.1 % CREA (HYDROCORTISONE BUTYRATE) apply twice daily as needed to affected area(s)  #45gm x 1   Entered and Authorized by:   Esperanza Sheets PA   Signed by:   Esperanza Sheets PA on 05/22/2010   Method used:   Electronically to        [facility] [address] (retail)       [address]       [place], [place]  [address]       Ph: [phone] or [phone]       Fax: [phone]   RxID:   [id number] BENZONATATE 100 MG CAPS (BENZONATATE) take 1 every 8 hrs as needed for cough  #30 x 0   Entered and Authorized by:   Esperanza Sheets PA   Signed by:   Esperanza Sheets PA on 05/22/2010   Method used:   Electronically to        [facility] [address] (retail)       [address]       [place], [place]  [address]       Ph: [phone] or [phone]       Fax: [phone]   RxID:   [id number]    Medication Administration  Injection # 1:    Medication: Depo- Medrol 80mg     Diagnosis: CONTACT DERMATITIS (ICD-692.9)    Route: IM    Site: RUOQ gluteus    Exp Date: 3/12    Lot #: [id number]    Mfr: [employer]    Patient tolerated injection without complications    Given by: Adella Hare LPN (May 22, 2010 4:30 PM)  Orders Added: 1)  T-Lipid Profile [[id number]] 2)  T- Hemoglobin A1C [83036-23375] 3)  T-Urine Microalbumin w/creat. ratio [[id number]] 4)  T-Comprehensive Metabolic Panel [80053-22900] 5)  T-Vitamin D (25-Hydroxy) [75102-58527] 6)  Depo- Medrol 80mg  [J1040] 7)  Admin of Therapeutic Inj  intramuscular or subcutaneous [96372] 8)  Est. Patient Level IV [30865]

## 2011-01-02 NOTE — Progress Notes (Signed)
Summary: meter  Phone Note Call from Patient   Summary of Call: the name is beyer contour for meter [id number] Initial call taken by: Rudene Anda,  August 18, 2010 10:21 AM  Follow-up for Phone Call        Rx Called In Follow-up by: Adella Hare LPN,  August 18, 2010 10:48 AM    New/Updated Medications: BAYER CONTOUR TEST  STRP (GLUCOSE BLOOD) once daily testing dx:250.00 Prescriptions: BAYER CONTOUR TEST  STRP (GLUCOSE BLOOD) once daily testing dx:250.00  #100 x 2   Entered by:   Adella Hare LPN   Authorized by:   Syliva Overman MD   Signed by:   Adella Hare LPN on 14/78/2956   Method used:   Electronically to        [facility] [address] (retail)       [address]       [place], [place]  [address]       Ph: [phone] or [phone]       Fax: [phone]   RxID:   [id number]

## 2011-01-02 NOTE — Letter (Signed)
Summary: lab  lab   Imported By: Curtis Sites 05/08/2010 14:17:56  _____________________________________________________________________  External Attachment:    Type:   Image     Comment:   External Document

## 2011-01-02 NOTE — Letter (Signed)
Summary: referral to breast center  referral to breast center   Imported By: Rudene Anda 07/11/2010 16:32:56  _____________________________________________________________________  External Attachment:    Type:   Image     Comment:   External Document

## 2011-01-02 NOTE — Letter (Signed)
Summary: misc.  misc.   Imported By: Curtis Sites 05/08/2010 14:19:23  _____________________________________________________________________  External Attachment:    Type:   Image     Comment:   External Document

## 2011-01-02 NOTE — Letter (Signed)
Summary: demographic  demographic   Imported By: Curtis Sites 05/08/2010 14:04:22  _____________________________________________________________________  External Attachment:    Type:   Image     Comment:   External Document

## 2011-01-02 NOTE — Assessment & Plan Note (Signed)
Summary: EARS  Nurse Visit   Vital Signs:  Patient profile:   55 year old female Menstrual status:  regular Height:      63.5 inches Weight:      198.50 pounds BMI:     34.74 BP sitting:   100 / 70  (left arm)  Vitals Entered By: Worthy Keeler LPN (December 30, 2009 10:31 AM) Comments patient in today for bilateral ear irrigation. both ears cleared and pt tolerated procedure well.  Worthy Keeler LPN  December 30, 2009 10:31 AM    Allergies: 1)  ! Codeine  Orders Added: 1)  Cerumen Impaction Removal [69210]

## 2011-01-02 NOTE — Letter (Signed)
Summary: progress notes  progress notes   Imported By: Curtis Sites 05/08/2010 14:19:42  _____________________________________________________________________  External Attachment:    Type:   Image     Comment:   External Document

## 2011-01-02 NOTE — Assessment & Plan Note (Signed)
Summary: office visit   Vital Signs:  Patient profile:   55 year old female Menstrual status:  regular Height:      63.5 inches Weight:      201.75 pounds BMI:     35.30 O2 Sat:      94 % Pulse rate:   78 / minute Pulse rhythm:   regular Resp:     16 per minute BP sitting:   110 / 70  (left arm)  Vitals Entered By: Everitt Amber LPN (March 08, 1913 4:23 PM)  Nutrition Counseling: Patient's BMI is greater than 25 and therefore counseled on weight management options. CC: Follow up chronic problems   Primary Care Provider:  Syliva Overman MD  CC:  Follow up chronic problems.  History of Present Illness: Reports  that tshe has been doing well. Denies recent fever or chills. Denies sinus pressure, nasal congestion , ear pain or sore throat. Denies chest congestion, or cough productive of sputum. Denies chest pain, palpitations, PND, orthopnea or leg swelling. Denies abdominal pain, nausea, vomitting, diarrhea or constipation. Denies change in bowel movements or bloody stool. Denies dysuria , frequency, incontinence or hesitancy. Denies  joint pain, swelling, or reduced mobility. Denies headaches, vertigo, seizures. Denies depression, anxiety or insomnia. Denies  rash, lesions, or itch.     Current Medications (verified): 1)  Ferrous Sulfate 325 (65 Fe) Mg  Tabs (Ferrous Sulfate) .... One Tab By Mouth Once Daily 2)  Ramipril 5 Mg  Caps (Ramipril) .... One Cap By Mouth Once Daily 3)  Aspirin Adult Low Strength 81 Mg Tbec (Aspirin) .... Take One Tab Once Daily 4)  Fluticasone Propionate 50 Mcg/act Susp (Fluticasone Propionate) .... Two Puffs Per Nostril Qd 5)  Cod Liver Oil  Caps (Cod Liver Oil) .... Take 1 Tablet By Mouth Once A Day 6)  Fish Oil 1000 Mg Caps (Omega-3 Fatty Acids) .... Take 1 Tablet By Mouth Two Times A Day 7)  Onetouch Ultra Test  Strp (Glucose Blood) .... Once Daily Testing 8)  Metformin Hcl 500 Mg Tabs (Metformin Hcl) .... 2 Tablets Twice Daily 9)   Glipizide 5 Mg Xr24h-Tab (Glipizide) .... Take 1 Tablet By Mouth Once A Day 10)  Sertraline Hcl 25 Mg Tabs (Sertraline Hcl) .... Take 1 Tablet By Mouth Once A Day 11)  Omeprazole 20 Mg Cpdr (Omeprazole) .... Take 1 Tablet By Mouth Once A Day  Allergies (verified): 1)  ! Codeine  Review of Systems      See HPI Eyes:  Denies blurring and discharge. Endo:  Denies cold intolerance, excessive hunger, excessive thirst, excessive urination, heat intolerance, polyuria, and weight change. Heme:  Denies abnormal bruising and bleeding. Allergy:  Complains of seasonal allergies.  Physical Exam  General:  Well-developedobese,in no acute distress; alert,appropriate and cooperative throughout examination HEENT: No facial asymmetry,  EOMI, no  sinus tenderness, TM's Clear, oropharynx  pink and moist.   Chest: adequate air entry, clear to ascultation CVS: S1, S2, No murmurs, No S3.   Abd: Soft, Nontender.  MS: Adequate ROM spine, hips, shoulders and knees.  Ext: No edema.   CNS: CN 2-12 intact, power tone and sensation normal throughout.   Skin: Intact, no visible lesions or rashes.  Psych: Good eye contact, normal affect.  Memory intact, not anxious or depressed appearing.   Diabetes Management Exam:    Foot Exam (with socks and/or shoes not present):       Sensory-Monofilament:  Left foot: diminished          Right foot: diminished       Inspection:          Left foot: normal          Right foot: normal       Nails:          Left foot: normal          Right foot: normal   Impression & Recommendations:  Problem # 1:  ALLERGIC RHINITIS, SEASONAL (ICD-477.0) Assessment Improved  Problem # 2:  GERD (ICD-530.81) Assessment: Improved  Her updated medication list for this problem includes:    Omeprazole 20 Mg Cpdr (Omeprazole) .Marland Kitchen... Take 1 tablet by mouth once a day  Problem # 3:  OBESITY, UNSPECIFIED (ICD-278.00) Assessment: Unchanged  Orders: T-Lipid Profile  ([id number])  Ht: 63.5 (03/08/2010)   Wt: 201.75 (03/08/2010)   BMI: 35.30 (03/08/2010)  Problem # 4:  HYPERTENSION (ICD-401.9) Assessment: Unchanged  Her updated medication list for this problem includes:    Ramipril 5 Mg Caps (Ramipril) ..... One cap by mouth once daily  Orders: T-Basic Metabolic Panel [id number])  Prior BP: 100/70 (12/29/2009)  Labs Reviewed: K+: 4.2 (08/24/2009) Creat: : 0.62 (08/24/2009)   Chol: 120 (05/19/2009)   HDL: 42 (05/19/2009)   LDL: 54 (05/19/2009)   TG: 122 (05/19/2009)  Problem # 5:  DIABETES MELLITUS, TYPE II (ICD-250.00) Assessment: Comment Only  Her updated medication list for this problem includes:    Ramipril 5 Mg Caps (Ramipril) ..... One cap by mouth once daily    Aspirin Adult Low Strength 81 Mg Tbec (Aspirin) .Marland Kitchen... Take one tab once daily    Metformin Hcl 500 Mg Tabs (Metformin hcl) .Marland Kitchen... 2 tablets twice daily    Glipizide 5 Mg Xr24h-tab (Glipizide) .Marland Kitchen... Take 1 tablet by mouth once a day  Labs Reviewed: Creat: 0.62 (08/24/2009)    Reviewed HgBA1c results: 7.8 (08/24/2009)  7.9 (05/18/2009)  Complete Medication List: 1)  Ferrous Sulfate 325 (65 Fe) Mg Tabs (Ferrous sulfate) .... One tab by mouth once daily 2)  Ramipril 5 Mg Caps (Ramipril) .... One cap by mouth once daily 3)  Aspirin Adult Low Strength 81 Mg Tbec (Aspirin) .... Take one tab once daily 4)  Fluticasone Propionate 50 Mcg/act Susp (Fluticasone propionate) .... Two puffs per nostril qd 5)  Cod Liver Oil Caps (Cod liver oil) .... Take 1 tablet by mouth once a day 6)  Fish Oil 1000 Mg Caps (Omega-3 fatty acids) .... Take 1 tablet by mouth two times a day 7)  Onetouch Ultra Test Strp (Glucose blood) .... Once daily testing 8)  Metformin Hcl 500 Mg Tabs (Metformin hcl) .... 2 tablets twice daily 9)  Glipizide 5 Mg Xr24h-tab (Glipizide) .... Take 1 tablet by mouth once a day 10)  Sertraline Hcl 25 Mg Tabs (Sertraline hcl) .... Take 1 tablet by mouth once a day 11)   Omeprazole 20 Mg Cpdr (Omeprazole) .... Take 1 tablet by mouth once a day 12)  Hydrocortisone Butyrate 0.1 % Crea (Hydrocortisone butyrate) .... Apply twice daily as needed to affected area(s)  Other Orders: T-Hepatic Function [id number]) T-CBC w/Diff [id number]) T-TSH [id number]) Future Orders: Radiology Referral (Radiology) ... 03/09/2010  Patient Instructions: 1)  Please schedule a follow-up appointment in 3 months. 2)  It is important that you exercise regularly at least 20 minutes 5 times a week. If you develop chest pain, have severe difficulty breathing, or feel very tired , stop exercising immediately  and seek medical attention. 3)  You need to lose weight. Consider a lower calorie diet and regular exercise.  4)  Check your blood sugars regularly. If your readings are usually above : or below 70 you should contact our office. 5)  BMP prior to visit, ICD-9: 6)  Hepatic Panel prior to visit, ICD-9:fasting labs 7)  Lipid Panel prior to visit, ICD-9: 8)  TSH prior to visit, ICD-9: 9)  CBC w/ Diff prior to visit, ICD-9: Prescriptions: GLIPIZIDE 5 MG XR24H-TAB (GLIPIZIDE) Take 1 tablet by mouth once a day  #30 x 2   Entered by:   Adella Hare LPN   Authorized by:   Syliva Overman MD   Signed by:   Adella Hare LPN on 16/09/9603   Method used:   Electronically to        [facility] [address] (retail)       [address]       [place], [place]  [address]       Ph: [phone] or [phone]       Fax: [phone]   RxID:   [id number] METFORMIN HCL 500 MG TABS (METFORMIN HCL) 2 tablets twice daily  #120 x 2   Entered by:   Adella Hare LPN   Authorized by:   Syliva Overman MD   Signed by:   Adella Hare LPN on 40/34/7425   Method used:   Electronically to        [facility] [address] (retail)       [address]       [place], [place]  [address]       Ph: [phone] or [phone]       Fax:  [phone]   RxID:   [id number] HYDROCORTISONE BUTYRATE 0.1 % CREA (HYDROCORTISONE BUTYRATE) apply twice daily as needed to affected area(s)  #45gm x 1   Entered and Authorized by:   Syliva Overman MD   Signed by:   Syliva Overman MD on 03/08/2010   Method used:   Electronically to        [facility] [address] (retail)       [address]       [place], [place]  [address]       Ph: [phone] or [phone]       Fax: [phone]   RxID:   [id number]

## 2011-01-02 NOTE — Assessment & Plan Note (Signed)
Summary: office visit   Vital Signs:  Patient profile:   55 year old female Menstrual status:  regular Height:      63.5 inches Weight:      201.50 pounds BMI:     35.26 O2 Sat:      96 % Pulse rate:   96 / minute Pulse rhythm:   regular Resp:     16 per minute BP sitting:   120 / 70  (left arm) Cuff size:   large  Vitals Entered By: Everitt Amber LPN (June 12, 2010 2:29 PM)  Nutrition Counseling: Patient's BMI is greater than 25 and therefore counseled on weight management options. CC: Follow up chronic problems   Primary Care Provider:  Syliva Overman MD  CC:  Follow up chronic problems.  History of Present Illness: Reports  that she has been doing fairly well. Denies recent fever or chills. Denies sinus pressure, nasal congestion , ear pain or sore throat. Denies chest congestion, or cough productive of sputum. Denies chest pain, palpitations, PND, orthopnea or leg swelling. Denies abdominal pain, nausea, vomitting, diarrhea or constipation. Denies change in bowel movements or bloody stool. Denies dysuria , frequency, incontinence or hesitancy. Denies  joint pain, swelling, or reduced mobility. Denies headaches, vertigo, seizures. Denies depression,reports  anxiety and denies insomnia. C/O of a dark lesion on rightlabia majora, sometimes puritic and stinging Pt has not been testing her sugars for the past 1 mmonth since she moved, "lost" her meter.     Current Medications (verified): 1)  Ferrous Sulfate 325 (65 Fe) Mg  Tabs (Ferrous Sulfate) .... One Tab By Mouth Once Daily 2)  Ramipril 5 Mg  Caps (Ramipril) .... One Cap By Mouth Once Daily 3)  Aspirin Adult Low Strength 81 Mg Tbec (Aspirin) .... Take One Tab Once Daily 4)  Fluticasone Propionate 50 Mcg/act Susp (Fluticasone Propionate) .... Two Puffs Per Nostril Qd 5)  Fish Oil 1000 Mg Caps (Omega-3 Fatty Acids) .... Take 1 Tablet By Mouth Two Times A Day 6)  Onetouch Ultra Test  Strp (Glucose Blood) .... Once  Daily Testing 7)  Metformin Hcl 500 Mg Tabs (Metformin Hcl) .... 2 Tablets Twice Daily 8)  Glipizide 5 Mg Xr24h-Tab (Glipizide) .... Take 1 Tablet By Mouth Once A Day 9)  Omeprazole 20 Mg Cpdr (Omeprazole) .... Take 1 Tablet By Mouth Once A Day 10)  Garlic Oil 500 Mg Tabs (Garlic) .... Take 1 Tablet By Mouth Once A Day 11)  Vitamin E 1000 Unit Caps (Vitamin E) .... Take 1 Tablet By Mouth Once A Day 12)  Super Calcium/d 600-125 Mg-Unit Tabs (Calcium-Vitamin D) .... Take 1 Tablet By Mouth Once A Day 13)  Hydrocortisone Butyrate 0.1 % Crea (Hydrocortisone Butyrate) .... Uad  Allergies (verified): 1)  ! Codeine  Review of Systems      See HPI General:  Complains of fatigue. Eyes:  Denies blurring and discharge. Derm:  Complains of itching and lesion(s); has dARK PURITIC LESION ON LABIA MAJORA FOR THE THE PAST SEVERAL WEEKS, CONCERNED ABOUT THIS. Endo:  Denies excessive thirst and excessive urination; FASTINGS WERE AROUND 130, NOPT TESTING FOR 1 MONTH. Heme:  Denies abnormal bruising and bleeding. Allergy:  Complains of seasonal allergies; denies hives or rash and itching eyes.  Physical Exam  General:  Well-developed,well-nourished,in no acute distress; alert,appropriate and cooperative throughout examination HEENT: No facial asymmetry,  EOMI, No sinus tenderness, TM's Clear, oropharynx  pink and moist.   Chest: Clear to auscultation bilaterally.  CVS: S1,  S2, No murmurs, No S3.   Abd: Soft, Nontender.  MS: Adequate ROM spine, hips, shoulders and knees.  Ext: No edema.   CNS: CN 2-12 intact, power tone and sensation normal throughout.   Skin: Intact, hyperpigmented macular lesion, max dia approx 1.5 cm on right labia majora Psych: Good eye contact, normal affect.  Memory intact, not anxious or depressed appearing.   Diabetes Management Exam:    Foot Exam (with socks and/or shoes not present):       Sensory-Monofilament:          Left foot: diminished          Right foot:  diminished       Inspection:          Left foot: normal          Right foot: normal       Nails:          Left foot: normal          Right foot: normal   Impression & Recommendations:  Problem # 1:  LESION, VULVA (ICD-236.3) Assessment Comment Only  Orders: Gynecologic Referral (Gyn)  Problem # 2:  MAMMOGRAM, ABNORMAL (ICD-793.80) Assessment: Comment Only  Orders: Radiology Referral (Radiology), pt missed appt for bx/further imaging the importance of same was stressed  Problem # 3:  OBESITY, UNSPECIFIED (ICD-278.00) Assessment: Unchanged  Ht: 63.5 (06/12/2010)   Wt: 201.50 (06/12/2010)   BMI: 35.26 (06/12/2010)  Problem # 4:  GERD (ICD-530.81) Assessment: Improved  Her updated medication list for this problem includes:    Omeprazole 20 Mg Cpdr (Omeprazole) .Marland Kitchen... Take 1 tablet by mouth once a day  Problem # 5:  DIABETES MELLITUS, TYPE II (ICD-250.00) Assessment: Improved  Her updated medication list for this problem includes:    Ramipril 5 Mg Caps (Ramipril) ..... One cap by mouth once daily    Aspirin Adult Low Strength 81 Mg Tbec (Aspirin) .Marland Kitchen... Take one tab once daily    Metformin Hcl 500 Mg Tabs (Metformin hcl) .Marland Kitchen... 2 tablets twice daily    Glipizide 5 Mg Xr24h-tab (Glipizide) .Marland Kitchen... Take 1 tablet by mouth once a day  Orders: T- Hemoglobin A1C ([id number])  Labs Reviewed: Creat: 0.63 (03/09/2010)    Reviewed HgBA1c results: 7.2 (03/13/2010)  7.8 (08/24/2009)  Complete Medication List: 1)  Ferrous Sulfate 325 (65 Fe) Mg Tabs (Ferrous sulfate) .... One tab by mouth once daily 2)  Ramipril 5 Mg Caps (Ramipril) .... One cap by mouth once daily 3)  Aspirin Adult Low Strength 81 Mg Tbec (Aspirin) .... Take one tab once daily 4)  Fluticasone Propionate 50 Mcg/act Susp (Fluticasone propionate) .... Two puffs per nostril qd 5)  Fish Oil 1000 Mg Caps (Omega-3 fatty acids) .... Take 1 tablet by mouth two times a day 6)  Onetouch Ultra Test Strp (Glucose blood) ....  Once daily testing 7)  Metformin Hcl 500 Mg Tabs (Metformin hcl) .... 2 tablets twice daily 8)  Glipizide 5 Mg Xr24h-tab (Glipizide) .... Take 1 tablet by mouth once a day 9)  Omeprazole 20 Mg Cpdr (Omeprazole) .... Take 1 tablet by mouth once a day 10)  Garlic Oil 500 Mg Tabs (Garlic) .... Take 1 tablet by mouth once a day 11)  Vitamin E 1000 Unit Caps (Vitamin e) .... Take 1 tablet by mouth once a day 12)  Super Calcium/d 600-125 Mg-unit Tabs (Calcium-vitamin d) .... Take 1 tablet by mouth once a day 13)  Hydrocortisone Butyrate 0.1 % Crea (Hydrocortisone butyrate) .Marland KitchenMarland KitchenMarland Kitchen  Uad  Patient Instructions: 1)  Please schedule a follow-up appointment in 3 months. 2)  It is important that you exercise regularly at least 20 minutes 5 times a week. If you develop chest pain, have severe difficulty breathing, or feel very tired , stop exercising immediately and seek medical attention. 3)  You need to lose weight. Consider a lower calorie diet and regular exercise.  4)  Check your blood sugars regularly. If your readings are usually above : or below 70 you should contact our office. 5)  HbgA1C prior to visit, ICD-9:  in 3 months 6)  you need the breast bx

## 2011-01-02 NOTE — Letter (Signed)
Summary: history and physical  history and physical   Imported By: Curtis Sites 05/08/2010 14:06:01  _____________________________________________________________________  External Attachment:    Type:   Image     Comment:   External Document

## 2011-01-02 NOTE — Miscellaneous (Signed)
  Clinical Lists Changes  Medications: Added new medication of GLIPIZIDE 10 MG XR24H-TAB (GLIPIZIDE) Take 1 tablet by mouth once a day - Signed Added new medication of PRAVASTATIN SODIUM 20 MG TABS (PRAVASTATIN SODIUM) Take 1 tab by mouth at bedtime - Signed Removed medication of GLIPIZIDE 5 MG XR24H-TAB (GLIPIZIDE) Take 1 tablet by mouth once a day Rx of GLIPIZIDE 10 MG XR24H-TAB (GLIPIZIDE) Take 1 tablet by mouth once a day;  #30 x 4;  Signed;  Entered by: Syliva Overman MD;  Authorized by: Syliva Overman MD;  Method used: Historical Rx of PRAVASTATIN SODIUM 20 MG TABS (PRAVASTATIN SODIUM) Take 1 tab by mouth at bedtime;  #30 x 4;  Signed;  Entered by: Syliva Overman MD;  Authorized by: Syliva Overman MD;  Method used: Historical    Prescriptions: PRAVASTATIN SODIUM 20 MG TABS (PRAVASTATIN SODIUM) Take 1 tab by mouth at bedtime  #30 x 4   Entered and Authorized by:   Syliva Overman MD   Signed by:   Syliva Overman MD on 06/13/2010   Method used:   Historical   RxID:   [id number] GLIPIZIDE 10 MG XR24H-TAB (GLIPIZIDE) Take 1 tablet by mouth once a day  #30 x 4   Entered and Authorized by:   Syliva Overman MD   Signed by:   Syliva Overman MD on 06/13/2010   Method used:   Historical   RxID:   [id number]   Appended Document:  Prescriptions: PRAVASTATIN SODIUM 20 MG TABS (PRAVASTATIN SODIUM) Take 1 tab by mouth at bedtime  #30 x 4   Entered by:   Adella Hare LPN   Authorized by:   Syliva Overman MD   Signed by:   Adella Hare LPN on 84/69/6295   Method used:   Electronically to        [facility] [address] (retail)       [address]       [place], [place]  [address]       Ph: [phone] or [phone]       Fax: [phone]   RxID:   [id number] GLIPIZIDE 10 MG XR24H-TAB (GLIPIZIDE) Take 1 tablet by mouth once a day  #30 x 4   Entered by:   Adella Hare LPN   Authorized by:   Syliva Overman MD   Signed by:    Adella Hare LPN on 01/60/1093   Method used:   Electronically to        [facility] [address] (retail)       [address]       [place], [place]  [address]       Ph: [phone] or [phone]       Fax: [phone]   RxID:   [id number]  patient aware

## 2011-01-02 NOTE — Medication Information (Signed)
Summary: Tax adviser   Imported By: Lind Guest 01/02/2010 11:29:54  _____________________________________________________________________  External Attachment:    Type:   Image     Comment:   External Document

## 2011-01-02 NOTE — Letter (Signed)
Summary: phone notes  phone notes   Imported By: Curtis Sites 05/08/2010 14:17:39  _____________________________________________________________________  External Attachment:    Type:   Image     Comment:   External Document

## 2011-01-02 NOTE — Assessment & Plan Note (Signed)
Summary: OV   Vital Signs:  Patient profile:   55 year old female Menstrual status:  regular Height:      63.5 inches Weight:      197.75 pounds BMI:     34.60 Pulse rate:   76 / minute Pulse rhythm:   regular Resp:     16 per minute BP sitting:   110 / 70  (left arm)  Vitals Entered By: Worthy Keeler LPN (December 22, 2009 5:00 PM)  Nutrition Counseling: Patient's BMI is greater than 25 and therefore counseled on weight management options. CC: follow-up visit Is Patient Diabetic? Yes Did you bring your meter with you today? No Pain Assessment Patient in pain? no        Primary Care Provider:  Syliva Overman MD  CC:  follow-up visit.  History of Present Illness: Reports  that she has been doing better since her last visit. Mentallyshe is in a better place, and she has severed a toxic relationship. Denies recent fever or chills.  Denies chest pain, palpitations, PND, orthopnea or leg swelling. Denies abdominal pain, nausea, vomitting, diarrhea or constipation. Denies change in bowel movements or bloody stool.  Denies  joint pain, swelling, or reduced mobility. Denies headaches, vertigo, seizures.  Denies  rash, lesions, or itch.     Current Medications (verified): 1)  Ferrous Sulfate 325 (65 Fe) Mg  Tabs (Ferrous Sulfate) .... One Tab By Mouth Once Daily 2)  Ramipril 5 Mg  Caps (Ramipril) .... One Cap By Mouth Once Daily 3)  Aspirin Adult Low Strength 81 Mg Tbec (Aspirin) .... Take One Tab Once Daily 4)  Fluticasone Propionate 50 Mcg/act Susp (Fluticasone Propionate) .... Two Puffs Per Nostril Qd 5)  Cod Liver Oil  Caps (Cod Liver Oil) .... Take 1 Tablet By Mouth Once A Day 6)  Fish Oil 1000 Mg Caps (Omega-3 Fatty Acids) .... Take 1 Tablet By Mouth Two Times A Day 7)  Onetouch Ultra Test  Strp (Glucose Blood) .... Once Daily Testing 8)  Metformin Hcl 500 Mg Tabs (Metformin Hcl) .... 2 Tablets Twice Daily 9)  Glipizide 5 Mg Xr24h-Tab (Glipizide) .... Take 1  Tablet By Mouth Once A Day 10)  Sertraline Hcl 25 Mg Tabs (Sertraline Hcl) .... Take 1 Tablet By Mouth Once A Day 11)  Fluconazole 150 Mg Tabs (Fluconazole) .... Take 1 Tablet By Mouth Once A Day 12)  Omeprazole 20 Mg Cpdr (Omeprazole) .... Take 1 Tablet By Mouth Once A Day  Allergies (verified): 1)  ! Codeine  Review of Systems      See HPI Eyes:  Denies blurring and discharge. ENT:  Complains of earache, nasal congestion, sinus pressure, and sore throat; 2 week h/o left esar pain, yellow nasal congestion and left facial pain. Resp:  Complains of cough and sputum productive; cough productive of yellow sputum. GU:  Complains of dysuria and urinary frequency; 2 day history. Neuro:  Denies headaches, seizures, and sensation of room spinning. Psych:  Complains of anxiety, depression, and mental problems; denies easily angered, easily tearful, irritability, suicidal thoughts/plans, thoughts of violence, and unusual visions or sounds; improved sym[ptoms. Endo:  Denies cold intolerance, excessive hunger, excessive thirst, excessive urination, heat intolerance, polyuria, and weight change; tests once daily, i58mproved sugars, fastings less than 125. Heme:  Denies abnormal bruising and bleeding. Allergy:  Complains of seasonal allergies.  Physical Exam  General:  Well-developedobese,in no acute distress; alert,appropriate and cooperative throughout examination HEENT: No facial asymmetry,  EOMI, frontal and maxillary sinus  tenderness, TM's Clear, oropharynx  pink and moist.   Chest: adequate air entry, bilateral crackles, few wheezes  CVS: S1, S2, No murmurs, No S3.   Abd: Soft, Nontender.  MS: Adequate ROM spine, hips, shoulders and knees.  Ext: No edema.   CNS: CN 2-12 intact, power tone and sensation normal throughout.   Skin: Intact, no visible lesions or rashes.  Psych: Good eye contact, normal affect.  Memory intact, not anxious or depressed appearing.    Impression &  Recommendations:  Problem # 1:  ACUTE BRONCHITIS (ICD-466.0) Assessment Comment Only  Her updated medication list for this problem includes:    Tessalon Perles 100 Mg Caps (Benzonatate) .Marland Kitchen... Take 1 capsule by mouth three times a day    Septra Ds 800-160 Mg Tabs (Sulfamethoxazole-trimethoprim) .Marland Kitchen... Take 1 tablet by mouth two times a day  Problem # 2:  ACUTE SINUSITIS, UNSPECIFIED (ICD-461.9) Assessment: Comment Only  Her updated medication list for this problem includes:    Fluticasone Propionate 50 Mcg/act Susp (Fluticasone propionate) .Marland Kitchen..Marland Kitchen Two puffs per nostril qd    Tessalon Perles 100 Mg Caps (Benzonatate) .Marland Kitchen... Take 1 capsule by mouth three times a day    Septra Ds 800-160 Mg Tabs (Sulfamethoxazole-trimethoprim) .Marland Kitchen... Take 1 tablet by mouth two times a day  Problem # 3:  ACUTE CYSTITIS (ICD-595.0) Assessment: Comment Only  Her updated medication list for this problem includes:    Septra Ds 800-160 Mg Tabs (Sulfamethoxazole-trimethoprim) .Marland Kitchen... Take 1 tablet by mouth two times a day  Orders: UA Dipstick W/ Micro (manual) ([address]), negative, pt reassured  Encouraged to push clear liquids, get enough rest, and take acetaminophen as needed. To be seen in 10 days if no improvement, sooner if worse.  Problem # 4:  DYSFUNCTIONAL UTERINE BLEEDING (ICD-626.8) Assessment: Improved  Problem # 5:  OBESITY, UNSPECIFIED (ICD-278.00) Assessment: Improved  Ht: 63.5 (12/22/2009)   Wt: 197.75 (12/22/2009)   BMI: 34.60 (12/22/2009)  Problem # 6:  DIABETES MELLITUS, TYPE II (ICD-250.00) Assessment: Comment Only  Her updated medication list for this problem includes:    Ramipril 5 Mg Caps (Ramipril) ..... One cap by mouth once daily    Aspirin Adult Low Strength 81 Mg Tbec (Aspirin) .Marland Kitchen... Take one tab once daily    Metformin Hcl 500 Mg Tabs (Metformin hcl) .Marland Kitchen... 2 tablets twice daily    Glipizide 5 Mg Xr24h-tab (Glipizide) .Marland Kitchen... Take 1 tablet by mouth once a day  Orders: Glucose, (CBG)  (82962) Hemoglobin A1C (83036), need documentation in the record, will msg urse  Labs Reviewed: Creat: 0.62 (08/24/2009)    Reviewed HgBA1c results: 7.8 (08/24/2009)  7.9 (05/18/2009)  Problem # 7:  HYPERTENSION (ICD-401.9) Assessment: Improved  Her updated medication list for this problem includes:    Ramipril 5 Mg Caps (Ramipril) ..... One cap by mouth once daily  BP today: 110/70 Prior BP: 122/86 (11/04/2009)  Labs Reviewed: K+: 4.2 (08/24/2009) Creat: : 0.62 (08/24/2009)   Chol: 120 (05/19/2009)   HDL: 42 (05/19/2009)   LDL: 54 (05/19/2009)   TG: 122 (05/19/2009)  Complete Medication List: 1)  Ferrous Sulfate 325 (65 Fe) Mg Tabs (Ferrous sulfate) .... One tab by mouth once daily 2)  Ramipril 5 Mg Caps (Ramipril) .... One cap by mouth once daily 3)  Aspirin Adult Low Strength 81 Mg Tbec (Aspirin) .... Take one tab once daily 4)  Fluticasone Propionate 50 Mcg/act Susp (Fluticasone propionate) .... Two puffs per nostril qd 5)  Cod Liver Oil Caps (Cod liver oil) .... Take  1 tablet by mouth once a day 6)  Fish Oil 1000 Mg Caps (Omega-3 fatty acids) .... Take 1 tablet by mouth two times a day 7)  Onetouch Ultra Test Strp (Glucose blood) .... Once daily testing 8)  Metformin Hcl 500 Mg Tabs (Metformin hcl) .... 2 tablets twice daily 9)  Glipizide 5 Mg Xr24h-tab (Glipizide) .... Take 1 tablet by mouth once a day 10)  Sertraline Hcl 25 Mg Tabs (Sertraline hcl) .... Take 1 tablet by mouth once a day 11)  Fluconazole 150 Mg Tabs (Fluconazole) .... Take 1 tablet by mouth once a day 12)  Omeprazole 20 Mg Cpdr (Omeprazole) .... Take 1 tablet by mouth once a day 13)  Tessalon Perles 100 Mg Caps (Benzonatate) .... Take 1 capsule by mouth three times a day 14)  Septra Ds 800-160 Mg Tabs (Sulfamethoxazole-trimethoprim) .... Take 1 tablet by mouth two times a day 15)  Fluconazole 150 Mg Tabs (Fluconazole) .... Take 1 tablet by mouth once a day as needed  Patient Instructions: 1)  Please  schedule a follow-up appointment in 3 months. 2)  It is important that you exercise regularly at least 20 minutes 5 times a week. If you develop chest pain, have severe difficulty breathing, or feel very tired , stop exercising immediately and seek medical attention. 3)  You need to lose weight. Consider a lower calorie diet and regular exercise.  4)  meds are sent in for sinusitis and bronchitis. Prescriptions: FLUCONAZOLE 150 MG TABS (FLUCONAZOLE) Take 1 tablet by mouth once a day as needed  #3 x 0   Entered and Authorized by:   Syliva Overman MD   Signed by:   Syliva Overman MD on 12/22/2009   Method used:   Electronically to        [facility] [address] (retail)       [address]       [place], [place]  [address]       Ph: [phone] or [phone]       Fax: [phone]   RxID:   [id number] SEPTRA DS 800-160 MG TABS (SULFAMETHOXAZOLE-TRIMETHOPRIM) Take 1 tablet by mouth two times a day  #20 x 0   Entered and Authorized by:   Syliva Overman MD   Signed by:   Syliva Overman MD on 12/22/2009   Method used:   Electronically to        [facility] [address] (retail)       [address]       [place], [place]  [address]       Ph: [phone] or [phone]       Fax: [phone]   RxID:   [id number] TESSALON PERLES 100 MG CAPS (BENZONATATE) Take 1 capsule by mouth three times a day  #30 x 0   Entered and Authorized by:   Syliva Overman MD   Signed by:   Syliva Overman MD on 12/22/2009   Method used:   Electronically to        [facility] [address] (retail)       [address]       [place], [place]  [address]       Ph: [phone] or [phone]       Fax: [phone]   RxID:   [id number]   Laboratory Results  Urine Tests  Date/Time Received: 12/22/09 Date/Time Reported: 12/22/09  Routine Urinalysis   Color: yellow Appearance:  Clear Glucose: negative   (Normal Range: Negative) Bilirubin: negative   (Normal Range: Negative) Ketone: trace (5)   (Normal Range: Negative) Spec. Gravity: 1.025   (Normal Range: 1.003-1.035) Blood: negative   (Normal Range: Negative) pH: 6.5   (Normal Range: 5.0-8.0) Protein: negative   (Normal Range: Negative) Urobilinogen: 0.2   (Normal Range: 0-1) Nitrite: negative   (Normal Range: Negative) Leukocyte Esterace: negative   (Normal Range: Negative)     Blood Tests   Date/Time Received: December 22, 2009  Date/Time Reported: December 22, 2009

## 2011-01-04 NOTE — Assessment & Plan Note (Signed)
Summary: FOLLOW-UP   Vital Signs:  Patient profile:   55 year old female Menstrual status:  regular Height:      63.5 inches Weight:      208.75 pounds BMI:     36.53 O2 Sat:      97 % on Room air Pulse rate:   80 / minute Pulse rhythm:   regular Resp:     16 per minute BP sitting:   132 / 90  (left arm)  Vitals Entered By: Adella Hare LPN (November 21, 2010 2:14 PM)  Nutrition Counseling: Patient's BMI is greater than 25 and therefore counseled on weight management options.  O2 Flow:  Room air CC: follow-up visit Is Patient Diabetic? Yes Did you bring your meter with you today? Yes   Primary Care Provider:  Syliva Overman MD  CC:  follow-up visit.  History of Present Illness: Reports  that she has been  doing fairly well well. Denies recent fever or chills. Denies sinus pressure, nasal congestion , ear pain or sore throat. Denies chest congestion, or cough productive of sputum. Denies chest pain, palpitations, PND, orthopnea or leg swelling. Denies abdominal pain, nausea, vomitting, diarrhea or constipation. Denies change in bowel movements or bloody stool. Denies dysuria , frequency, incontinence or hesitancy.  Denies headaches, vertigo, seizures. Denies depression, anxiety or insomnia.    Current Medications (verified): 1)  Ferrous Sulfate 325 (65 Fe) Mg  Tabs (Ferrous Sulfate) .... One Tab By Mouth Once Daily 2)  Ramipril 5 Mg  Caps (Ramipril) .... One Cap By Mouth Once Daily 3)  Aspirin Adult Low Strength 81 Mg Tbec (Aspirin) .... Take One Tab Once Daily 4)  Fluticasone Propionate 50 Mcg/act Susp (Fluticasone Propionate) .... Two Puffs Per Nostril Qd 5)  Fish Oil 1000 Mg Caps (Omega-3 Fatty Acids) .... Take 1 Tablet By Mouth Two Times A Day 6)  Onetouch Ultra Test  Strp (Glucose Blood) .... Once Daily Testing 7)  Metformin Hcl 500 Mg Tabs (Metformin Hcl) .... 2 Tablets Twice Daily 8)  Omeprazole 20 Mg Cpdr (Omeprazole) .... Take 1 Tablet By Mouth Once A  Day 9)  Garlic Oil 500 Mg Tabs (Garlic) .... Take 1 Tablet By Mouth Once A Day 10)  Vitamin E 1000 Unit Caps (Vitamin E) .... Take 1 Tablet By Mouth Once A Day 11)  Super Calcium/d 600-125 Mg-Unit Tabs (Calcium-Vitamin D) .... Take 1 Tablet By Mouth Once A Day 12)  Hydrocortisone Butyrate 0.1 % Crea (Hydrocortisone Butyrate) .... Uad 13)  Glipizide 10 Mg Xr24h-Tab (Glipizide) .... Take 1 Tablet By Mouth Once A Day 14)  Pravastatin Sodium 20 Mg Tabs (Pravastatin Sodium) .... Take 1 Tab By Mouth At Bedtime 15)  Acyclovir 800 Mg Tabs (Acyclovir) .... One Tablet Five Times Daily 16)  Strips and Lancets .... For Once Daily Testing 17)  Bayer Contour Test  Strp (Glucose Blood) .... Once Daily Testing Dx:250.00  Allergies (verified): 1)  ! Codeine  Review of Systems      See HPI General:  Complains of fatigue. Eyes:  Denies discharge and red eye. GU:  Complains of incontinence; requestedpantyliner happens with sneezing . MS:  Complains of joint pain; left DIP index pain and swelling wih discolorAATION, SQUEEZED FINGER IN THE CAR DOOR TODAY, also c/o neck pain also tingling and numbness in both hands. Derm:  Complains of changes in color of skin; nail growing back where removed from left grt toe painful. Endo:  Denies excessive thirst and excessive urination; tests twice daily  on avg , sugars seldom over 200,once down to 57, advised to test once daily. Heme:  Denies abnormal bruising and bleeding. Allergy:  Denies hives or rash and itching eyes.  Physical Exam  General:  Well-developed,well-nourished,in no acute distress; alert,appropriate and cooperative throughout examination HEENT: No facial asymmetry,  EOMI, No sinus tenderness, TM's Clear, oropharynx  pink and moist.   Chest: Clear to auscultation bilaterally.  CVS: S1, S2, No murmurs, No S3.   Abd: Soft, Nontender.  MS: Adequate ROM spine, hips, shoulders and knees. left index finger distal IiP swollen and tender with decreased  ROM Ext: No edema.   CNS: CN 2-12 intact, power tone and sensation normal throughout.   Skin: Intact, no visible lesions or rashes.  Psych: Good eye contact, normal affect.  Memory intact, not anxious or depressed appearing.    Impression & Recommendations:  Problem # 1:  BREAST CANCER (ICD-174.9) Assessment Comment Only appt made with surgery, pt has been self medicating with suplements for localised ds   Problem # 2:  HYPERLIPIDEMIA (ICD-272.4) Assessment: Comment Only  Her updated medication list for this problem includes:    Pravastatin Sodium 20 Mg Tabs (Pravastatin sodium) .Marland Kitchen... Take 1 tab by mouth at bedtime  Labs Reviewed: SGOT: 20 (03/09/2010)   SGPT: 25 (03/09/2010)   HDL:44 (06/12/2010), 37 (03/09/2010)  LDL:102 (06/12/2010), 100 (03/09/2010)  Chol:162 (06/12/2010), 166 (03/09/2010)  Trig:81 (06/12/2010), 144 (03/09/2010)  Problem # 3:  HYPERTENSION (ICD-401.9) Assessment: Deteriorated  Her updated medication list for this problem includes:    Ramipril 5 Mg Caps (Ramipril) ..... One cap by mouth once daily  Orders: T-Basic Metabolic Panel [phone]) T-Basic Metabolic Panel [id number])  BP today: 132/90 Prior BP: 120/70 (06/12/2010)  Labs Reviewed: K+: 4.3 (06/12/2010) Creat: : 0.65 (06/12/2010)   Chol: 162 (06/12/2010)   HDL: 44 (06/12/2010)   LDL: 102 (06/12/2010)   TG: 81 (06/12/2010)  Problem # 4:  DIABETES MELLITUS, TYPE II (ICD-250.00) Assessment: Comment Only  Her updated medication list for this problem includes:    Ramipril 5 Mg Caps (Ramipril) ..... One cap by mouth once daily    Aspirin Adult Low Strength 81 Mg Tbec (Aspirin) .Marland Kitchen... Take one tab once daily    Metformin Hcl 500 Mg Tabs (Metformin hcl) .Marland Kitchen... 2 tablets twice daily    Glipizide 10 Mg Xr24h-tab (Glipizide) .Marland Kitchen... Take 1 tablet by mouth once a day Patient advised to reduce carbs and sweets, commit to regular physical activity, take meds as prescribed, test blood sugars as directed,  and attempt to lose weight , to improve blood sugar control.  Orders: T- Hemoglobin A1C [phone]) T- Hemoglobin A1C [id number])  Labs Reviewed: Creat: 0.65 (06/12/2010)    Reviewed HgBA1c results: 7.5 (06/12/2010)  7.2 (03/13/2010)  Problem # 5:  OTHER URINARY INCONTINENCE (ICD-788.39) Assessment: Deteriorated  Orders: Medicare Electronic Prescription (M8413)  Complete Medication List: 1)  Ferrous Sulfate 325 (65 Fe) Mg Tabs (Ferrous sulfate) .... One tab by mouth once daily 2)  Ramipril 5 Mg Caps (Ramipril) .... One cap by mouth once daily 3)  Aspirin Adult Low Strength 81 Mg Tbec (Aspirin) .... Take one tab once daily 4)  Fluticasone Propionate 50 Mcg/act Susp (Fluticasone propionate) .... Two puffs per nostril qd 5)  Fish Oil 1000 Mg Caps (Omega-3 fatty acids) .... Take 1 tablet by mouth two times a day 6)  Onetouch Ultra Test Strp (Glucose blood) .... Once daily testing 7)  Metformin Hcl 500 Mg Tabs (Metformin hcl) .... 2 tablets twice daily  8)  Omeprazole 20 Mg Cpdr (Omeprazole) .... Take 1 tablet by mouth once a day 9)  Garlic Oil 500 Mg Tabs (Garlic) .... Take 1 tablet by mouth once a day 10)  Vitamin E 1000 Unit Caps (Vitamin e) .... Take 1 tablet by mouth once a day 11)  Super Calcium/d 600-125 Mg-unit Tabs (Calcium-vitamin d) .... Take 1 tablet by mouth once a day 12)  Hydrocortisone Butyrate 0.1 % Crea (Hydrocortisone butyrate) .... Uad 13)  Glipizide 10 Mg Xr24h-tab (Glipizide) .... Take 1 tablet by mouth once a day 14)  Pravastatin Sodium 20 Mg Tabs (Pravastatin sodium) .... Take 1 tab by mouth at bedtime 15)  Strips and Lancets  .... For once daily testing 16)  Bayer Contour Test Strp (Glucose blood) .... Once daily testing dx:250.00 17)  Neurontin 100 Mg Caps (Gabapentin) .... One to three capsules at bedtime for left arm pain and hand pain with tingling 18)  Hydrocortisone Butyrate 0.1 % Crea (Hydrocortisone butyrate) .... Apply twice daily to affected area  for 10 days, then as needs 19)  Oxybutynin Chloride 5 Mg Tabs (Oxybutynin chloride) .... Take 1 tablet by mouth two times a day 20)  Acyclovir 400 Mg Tabs (Acyclovir) .... Take 1 tablet by mouth two times a day  Other Orders: Surgical Referral (Surgery) Influenza Vaccine NON MCR ([address]315) T-Hepatic Function [id number]) T-Lipid Profile ([id number])  Patient Instructions: 1)  Please schedule a follow-up appointment in 3.5 months. 2)  It is important that you exercise regularly at least 30 minutes 5 times a week. If you develop chest pain, have severe difficulty breathing, or feel very tired , stop exercising immediately and seek medical attention. 3)  You need to lose weight. Consider a lower calorie diet and regular exercise.  4)  Check your blood sugars once daily. If your readings are usually above 250 or below 70 you should contact our office. 5)  BMP prior to visit, ICD-9: 6)  HbgA1C prior to visit, ICD-9: 7)  YOU ABSOLUITELY need to get the breast surgery since you have localised breast cancer, we will set up an appt  Prescriptions: ACYCLOVIR 400 MG TABS (ACYCLOVIR) Take 1 tablet by mouth two times a day  #60 x 4   Entered and Authorized by:   Syliva Overman MD   Signed by:   Syliva Overman MD on 11/21/2010   Method used:   Electronically to        [facility] [address] (retail)       [address]       [place], [place]  [address]       Ph: [phone] or [phone]       Fax: [phone]   RxID:   [id number] OXYBUTYNIN CHLORIDE 5 MG TABS (OXYBUTYNIN CHLORIDE) Take 1 tablet by mouth two times a day  #60 x 3   Entered and Authorized by:   Syliva Overman MD   Signed by:   Syliva Overman MD on 11/21/2010   Method used:   Electronically to        [facility] [address] (retail)       [address]       [place], [place]  [address]       Ph: [phone] or [phone]       Fax: [phone]    RxID:   [id number] HYDROCORTISONE BUTYRATE 0.1 % CREA (HYDROCORTISONE BUTYRATE)  apply twice daily to affected area for 10 days, then as needs  #45 x 1   Entered and Authorized by:   Syliva Overman MD   Signed by:   Syliva Overman MD on 11/21/2010   Method used:   Electronically to        [facility] [address] (retail)       [address]       [place], [place]  [address]       Ph: [phone] or [phone]       Fax: [phone]   RxID:   [id number] NEURONTIN 100 MG CAPS (GABAPENTIN) one to three capsules at bedtime for left arm pain and hand pain with tingling  #90 x 2   Entered and Authorized by:   Syliva Overman MD   Signed by:   Syliva Overman MD on 11/21/2010   Method used:   Electronically to        [facility] [address] (retail)       [address]       [place], [place]  [address]       Ph: [phone] or [phone]       Fax: [phone]   RxID:   [id number]    Orders Added: 1)  Est. Patient Level IV [[address]] 2)  Medicare Electronic Prescription [G8553] 3)  T-Basic Metabolic Panel [[id number]] 4)  T- Hemoglobin A1C [83036-23375] 5)  Surgical Referral [Surgery] 6)  Influenza Vaccine NON MCR [00028] 7)  T-Basic Metabolic Panel [80048-22910] 8)  T-Hepatic Function [80076-22960] 9)  T-Lipid Profile [80061-22930] 10)  T- Hemoglobin A1C [83036-23375]   Immunizations Administered:  Influenza Vaccine # 1:    Vaccine Type: Fluvax Non-MCR    Site: right deltoid    Mfr: GlaxoSmithKline    Dose: 0.5 ml    Route: IM    Given by: Adella Hare LPN    Exp. Date: 06/02/2011    Lot #: [id number]    VIS given: 06/27/10 version given November 21, 2010.   Immunizations Administered:  Influenza Vaccine # 1:    Vaccine Type: Fluvax Non-MCR    Site: right deltoid    Mfr: GlaxoSmithKline    Dose: 0.5 ml    Route: IM    Given by: Adella Hare LPN    Exp. Date: 06/02/2011    Lot  #: [id number]    VIS given: 06/27/10 version given November 21, 2010.

## 2011-01-30 NOTE — Letter (Signed)
Summary: External Correspondence  External Correspondence   Imported By: Lind Guest 01/25/2011 11:40:25  _____________________________________________________________________  External Attachment:    Type:   Image     Comment:   External Document

## 2011-03-02 ENCOUNTER — Emergency Department (HOSPITAL_COMMUNITY)
Admission: EM | Admit: 2011-03-02 | Discharge: 2011-03-02 | Disposition: A | Discharge status: Home / Self Care | Payer: Medicare Other | Attending: Emergency Medicine | Admitting: Emergency Medicine

## 2011-03-02 ENCOUNTER — Emergency Department (HOSPITAL_COMMUNITY): Payer: Medicare Other

## 2011-03-02 DIAGNOSIS — E119 Type 2 diabetes mellitus without complications: Secondary | ICD-10-CM | POA: Insufficient documentation

## 2011-03-02 DIAGNOSIS — I10 Essential (primary) hypertension: Secondary | ICD-10-CM | POA: Insufficient documentation

## 2011-03-02 DIAGNOSIS — M542 Cervicalgia: Secondary | ICD-10-CM | POA: Insufficient documentation

## 2011-03-02 DIAGNOSIS — IMO0002 Reserved for concepts with insufficient information to code with codable children: Secondary | ICD-10-CM | POA: Insufficient documentation

## 2011-03-02 DIAGNOSIS — M25519 Pain in unspecified shoulder: Secondary | ICD-10-CM | POA: Insufficient documentation

## 2011-03-02 DIAGNOSIS — K219 Gastro-esophageal reflux disease without esophagitis: Secondary | ICD-10-CM | POA: Insufficient documentation

## 2011-03-02 DIAGNOSIS — X58XXXA Exposure to other specified factors, initial encounter: Secondary | ICD-10-CM | POA: Insufficient documentation

## 2011-03-21 ENCOUNTER — Other Ambulatory Visit: Payer: Self-pay | Admitting: Family Medicine

## 2011-03-21 LAB — HEMOGLOBIN A1C
Hgb A1c MFr Bld: 8.2 % — ABNORMAL HIGH (ref <5.7)
Mean Plasma Glucose: 189 mg/dL — ABNORMAL HIGH (ref <117)

## 2011-03-23 ENCOUNTER — Encounter: Payer: Self-pay | Admitting: Family Medicine

## 2011-03-26 ENCOUNTER — Encounter: Payer: Self-pay | Admitting: Family Medicine

## 2011-03-26 ENCOUNTER — Ambulatory Visit (INDEPENDENT_AMBULATORY_CARE_PROVIDER_SITE_OTHER): Payer: Medicare Other | Admitting: Family Medicine

## 2011-03-26 VITALS — BP 130/82 | HR 78 | Resp 16 | Wt 204.0 lb

## 2011-03-26 DIAGNOSIS — R5381 Other malaise: Secondary | ICD-10-CM

## 2011-03-26 DIAGNOSIS — D509 Iron deficiency anemia, unspecified: Secondary | ICD-10-CM

## 2011-03-26 DIAGNOSIS — R5383 Other fatigue: Secondary | ICD-10-CM

## 2011-03-26 DIAGNOSIS — E785 Hyperlipidemia, unspecified: Secondary | ICD-10-CM

## 2011-03-26 DIAGNOSIS — E119 Type 2 diabetes mellitus without complications: Secondary | ICD-10-CM

## 2011-03-26 DIAGNOSIS — R928 Other abnormal and inconclusive findings on diagnostic imaging of breast: Secondary | ICD-10-CM

## 2011-03-26 DIAGNOSIS — I10 Essential (primary) hypertension: Secondary | ICD-10-CM

## 2011-03-26 MED ORDER — GLIPIZIDE ER 10 MG PO TB24: 10.0000 mg | ORAL_TABLET | Freq: Every day | ORAL | Status: DC

## 2011-03-26 MED ORDER — ACCU-CHEK AVIVA PLUS W/DEVICE KIT: 1.0000 | PACK | Freq: Every day | Status: DC

## 2011-03-26 MED ORDER — RAMIPRIL 5 MG PO TABS: 5.0000 mg | ORAL_TABLET | Freq: Every day | ORAL | Status: DC

## 2011-03-26 MED ORDER — ACCU-CHEK MULTICLIX LANCETS MISC: Status: AC

## 2011-03-26 MED ORDER — METFORMIN HCL ER (MOD) 500 MG PO TB24: 500.0000 mg | ORAL_TABLET | Freq: Every day | ORAL | Status: DC

## 2011-03-26 MED ORDER — GLUCOSE BLOOD VI STRP: ORAL_STRIP | Status: AC

## 2011-03-26 NOTE — Patient Instructions (Signed)
F/u in 6 weeks.  Fasting lipid, CMP and EGFR, CBC and TSH asap.  pLS taske the medications as precribed that I have sored out for you  A mammogram will be scheduled asap, you do need the surgical procedure recommended for your breast

## 2011-04-05 NOTE — Progress Notes (Signed)
  Subjective:    Patient ID: [name], female    DOB: [birth year], 55 y.o.   MRN: [id number]  HPI The PT is here for follow up and re-evaluation of chronic medical conditions, medication management and review of recent lab and radiology data.  Preventive health is updated, specifically  Cancer screening, and Immunization.Of note in 2011, pt had an abnormal mammogram which was biopsied, and surgical intervention recommended. She did see a Biomedical engineer, but even on the day of this visit is refusing to follow through with any surgery. States she has been taking herbs and has had prayers from her [employer]. She fully understands that the feeling is that she has breast cancer and needs surgery.    Questions or concerns regarding consultations or procedures which the PT has had in the interim are  addressed. The PT denies any adverse reactions to current medications since the last visit.  There are no new concerns.  There are no specific complaints      Review of Systems Denies recent fever or chills. Denies sinus pressure, nasal congestion, ear pain or sore throat. Denies chest congestion, productive cough or wheezing. Denies chest pains, palpitations, paroxysmal nocturnal dyspnea, orthopnea and leg swelling Denies abdominal pain, nausea, vomiting,diarrhea or constipation.  Denies rectal bleeding or change in bowel movement. Denies dysuria, frequency, hesitancy or incontinence. Denies joint pain, swelling and limitation in mobility. Denies headaches, seizure, numbness, or tingling. Denies depression,she does have chronic  Anxiety denies r insomnia. Denies skin break down or rash.        Objective:   Physical Exam Patient alert and oriented and in no Cardiopulmonary distress.  HEENT: No facial asymmetry, EOMI, no sinus tenderness, , Oropharynx pink and moist.  Neck supple no adenopathy.  Chest: Clear to auscultation bilaterally.  CVS: S1, S2 no murmurs, no S3.  ABD: Soft non  tender. Bowel sounds normal.  Ext: No edema  MS: Adequate ROM spine, shoulders, hips and knees.  Skin: Intact, no ulcerations or rash noted.  Psych: Good eye contact, normal affect. Memory intact anxious not  depressed appearing.  CNS: CN 2-12 intact, power, tone and sensation normal throughout.   Diabetic Foot Check:  Appearance - no lesions, ulcers  She has calluses Skin - no unusual pallor or redness, thickening of the toenails Sensation - grossly intact to light touch Monofilament testing -  Right - Great toe, medial, central, lateral ball and posterior foot decreasedt Left - Great toe, medial, central, lateral ball and posterior foot decreased Pulses Left - Dorsalis Pedis and Posterior Tibia normal Right - Dorsalis Pedis and Posterior Tibia normal     Assessment & Plan:

## 2011-04-09 ENCOUNTER — Encounter: Payer: Self-pay | Admitting: Family Medicine

## 2011-04-09 NOTE — Assessment & Plan Note (Signed)
Controlled, no change in medication  

## 2011-04-09 NOTE — Assessment & Plan Note (Signed)
" >>  ASSESSMENT AND PLAN FOR TYPE II DIABETES MELLITUS (HCC) WRITTEN ON 04/09/2011  7:31 PM BY ANTONETTA QUANT, MD  Medication compliance addressed. Commitment to regular exercise and healthy  food choices, with portion control discussed. DASH diet and low fat diet discussed and literature offered. Changes in medication made at this visit.  "

## 2011-04-09 NOTE — Assessment & Plan Note (Signed)
Medication compliance addressed. Commitment to regular exercise and healthy  food choices, with portion control discussed. DASH diet and low fat diet discussed and literature offered. Changes in medication made at this visit.  

## 2011-04-09 NOTE — Assessment & Plan Note (Signed)
ldl elevated, uncontrolled, reduction in fat intake encouraged

## 2011-04-10 ENCOUNTER — Telehealth: Payer: Self-pay | Admitting: Family Medicine

## 2011-04-10 NOTE — Telephone Encounter (Signed)
Patient called back and I advised her of the appointment at [facility] for her mammogram.

## 2011-04-17 NOTE — H&P (Signed)
[name], [name]               ACCOUNT NO.:  [id number]   MEDICAL RECORD NO.:  [id number]          PATIENT TYPE:  AMB   LOCATION:  DAY                           FACILITY:  [facility]   PHYSICIAN:  Dalia Heading, M.D.  DATE OF BIRTH:  1956-02-24   DATE OF ADMISSION:  DATE OF DISCHARGE:  [facility]                              HISTORY & PHYSICAL   CHIEF COMPLAINT:  Need for screening colonoscopy.   HISTORY OF PRESENT ILLNESS:  The patient is a 55 year old black female  who is referred for endoscopic evaluation.  She needs a colonoscopy for  screening purposes.  No abdominal pain, weight loss, nausea, vomiting,  diarrhea, constipation, melena, or hematochezia have been noted.  She  has never had a colonoscopy.  There is no family history of colon  carcinoma.   PAST MEDICAL HISTORY:  Includes non-insulin dependent diabetes mellitus,  hypertension.   PAST SURGICAL HISTORY:  Unremarkable.   CURRENT MEDICATIONS:  Metformin, ramipril, Premphase.   ALLERGIES:  NO KNOWN DRUG ALLERGIES.   REVIEW OF SYSTEMS:  Noncontributory.   PHYSICAL EXAMINATION:  GENERAL: On physical examination, the patient is  a well-developed, well-nourished white female in no acute distress.  LUNGS:  Clear to auscultation with equal breath sounds bilaterally.  HEART:  On examination reveals regular rate and rhythm without S3, S4,  or murmurs.  ABDOMEN:  The abdomen is soft, nontender, nondistended.  No  hepatosplenomegaly or masses are noted.  RECTAL:  Examination was deferred to the procedure.   IMPRESSION:  Need for screening colonoscopy.   PLAN:  The patient is scheduled for colonoscopy on January 06, 2008.  The risks and benefits of the procedure including bleeding and  perforation were fully explained to the patient and I gave informed  consent.      Dalia Heading, M.D.  Electronically Signed     MAJ/MEDQ  D:  12/23/2007  T:  12/24/2007  Job:  [id number]   cc:   Milus Mallick. Lodema Hong, M.D.  Fax:  [phone]

## 2011-04-20 NOTE — Discharge Summary (Signed)
[name], [name]               ACCOUNT NO.:  [id number]   MEDICAL RECORD NO.:  [id number]          PATIENT TYPE:  INP   LOCATION:  2027                         FACILITY:  [facility]   PHYSICIAN:  Charlton Haws, M.D.     DATE OF BIRTH:  03-Dec-1956   DATE OF ADMISSION:  02/28/2005  DATE OF DISCHARGE:  03/02/2005                           DISCHARGE SUMMARY - REFERRING   SUMMARY OF HISTORY:  Ms. Plath is a 55 year old African-American female with  no known documented coronary artery disease. She began having some grabbing  in her anterior chest, lasting less than one minute.  Her discomfort was not  exertional in nature and was felt to be very atypical;  however, she was  sent to the emergency room by her primary care physician for further  evaluation.  Her discomfort also worsened slightly with deep inspiration.  She was hospitalized at [facility], and a stress Cardiolite revealed  a poor exercise tolerance and a moderate size anteroapical defect consistent  with ischemia.  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   LABORATORY:  Chest x-ray on February 28, 2005 did not show any active  cardiopulmonary processes.  Admission weight was 207.6, hemoglobin 11.7,  hematocrit 35.3.  MCV was slightly low at 77.9, platelets 377,000.  WBC was  13.8.  On the day of discharge, hemoglobin 11.2 and hematocrit 33.2,  platelets 326,000.  WBC was 11.1, MCV 77.8.  PTT 28, PT 13.  D-dimer 0.24.  Sodium 138, potassium 3.8, BUN 13, creatinine 0.8, normal LFTs.  Prior to  discharge, sodium was 137, potassium 3.1, BUN 12, creatinine 0.8.  Hemoglobin A1c was high at 6.5. CK-MB, relative index, and troponin were  negative x1.  Fasting lipids showed a total cholesterol of 133,  triglycerides 103, HDL 33, LDL 79.  An EKG showed normal sinus rhythm,  normal  axis, no acute changes.   HOSPITAL COURSE:  Ms. Hinsley was transferred to [facility] on February 28, 2005 and continued on IV heparin, and it was felt that she should  undergo cardiac catheterization given her positive stress test.  On  03/01/2005, catheterization was performed by Dr. Riley Kill.  This did not show  any coronary artery disease, and  her LV function was normal.  Overnight,  the catheterization site was intact.  She was ambulating without difficulty.  Dr. Eden Emms assessed the patient and felt that the patient could be  discharged home.   DIAGNOSES:  1.  Noncardiac chest discomfort with a false positive Cardiolite.  2.  History of diabetes with an elevated hemoglobin A1c.  3.  Anemia.   DISPOSITION:  She is discharged home. She is asked to continue her home  medications.   DISCHARGE MEDICATIONS:  1.  Calcium.  2.  Aspirin 81 mg daily.  3.  Altace 2.5 daily.  4.  Avandia 4 mg daily.  5.  Multivitamin daily.  6.  Garlic as previously.  7.  She was asked to take Tylenol for any discomfort she might have with her      catheterization site.   DISCHARGE ACTIVITIES:  She was advised no lifting, driving, sexual activity,  or heavy exertion x2 days.   DISCHARGE DIET:  Maintain low-salt, fat, cholesterol ADA diet.   FOLLOW UP:  If she has any problems with her catheterization site, she was  asked to call immediately.  She was asked to call and arrange a follow-up  appointment with Dr. Lodema Hong.      EW/MEDQ  D:  03/02/2005  T:  03/02/2005  Job:  [id number]   cc:   Lodema Hong, M.D.  Venita Sheffield, [place]

## 2011-04-20 NOTE — Cardiovascular Report (Signed)
[name], [name]               ACCOUNT NO.:  [id number]   MEDICAL RECORD NO.:  [id number]          PATIENT TYPE:  INP   LOCATION:  2027                         FACILITY:  [facility]   PHYSICIAN:  Arturo Morton. Riley Kill, M.D. [facility] OF BIRTH:  [birth year]   DATE OF PROCEDURE:  03/01/2005  DATE OF DISCHARGE:                              CARDIAC CATHETERIZATION   INDICATIONS:  Ms. Gibeault is a very delightful 55 year old woman who has  diabetes. She has had some chest pain and abnormal Cardiolite study with an  anterior defect. The current study was done to assess coronary anatomy.   PROCEDURES:  1.  Left heart catheterization.  2.  Selective coronary arteriography.  3.  Selective left ventriculography.   DESCRIPTION OF PROCEDURE:  The procedure was performed from the right  femoral artery using 4-French catheters. She tolerated procedure without  complication. She was taken to the holding area in satisfactory clinical  condition.   HEMODYNAMIC DATA:  1.  Central aortic pressure 110/73.  2.  Left ventricular pressure 115/12.  3.  No gradient pullback across aortic valve.   ANGIOGRAPHIC DATA:  1.  Ventriculography was done the RAO projection. Overall systolic function      appeared to be preserved. No segmental abnormalities or contractions      were identified. There did not appear to be significant mitral      regurgitation.  2.  The right coronary is large-caliber vessel providing the PDA and      posterolateral vessels. The vessel was large and free of significant      disease.  3.  The left main coronary is free of critical disease.  4.  The left anterior descending artery courses to the apex. There are two      diagonal branches. The LAD appears to be smooth throughout and I cannot      identify any high-grade areas of focal stenosis.  5.  The circumflex provides several marginal branches. There is perhaps mild      Luminal irregularity involving the proximal portion of the  first      marginal branch but the remainder of the vessels are free of critical      disease.   CONCLUSION:  1.  Well-preserved overall left ventricular function.  2.  Mild coronary abnormalities as noted above.   DISPOSITION:  The further cause of chest pain will be evaluated by the [facility]  team. is Arturo Morton. He 9 cm dictation      TDS/MEDQ  D:  03/01/2005  T:  03/01/2005  Job:  [id number]   cc:   Milus Mallick. Lodema Hong, M.D.  [address]  [place], [place] [address]  Fax: [phone]   Willa Rough, M.D.   [facility]   Patient's medical records

## 2011-04-25 ENCOUNTER — Ambulatory Visit (HOSPITAL_COMMUNITY): Payer: Medicare Other

## 2011-05-22 ENCOUNTER — Encounter: Payer: Self-pay | Admitting: Family Medicine

## 2011-05-28 ENCOUNTER — Ambulatory Visit: Payer: Medicare Other | Admitting: Family Medicine

## 2011-09-27 ENCOUNTER — Encounter: Payer: Self-pay | Admitting: Family Medicine

## 2011-09-28 ENCOUNTER — Encounter: Payer: Self-pay | Admitting: Family Medicine

## 2011-09-28 ENCOUNTER — Ambulatory Visit: Payer: Medicare Other | Admitting: Family Medicine

## 2011-10-01 ENCOUNTER — Ambulatory Visit (INDEPENDENT_AMBULATORY_CARE_PROVIDER_SITE_OTHER): Payer: Medicare Other | Admitting: Family Medicine

## 2011-10-01 ENCOUNTER — Encounter: Payer: Self-pay | Admitting: Family Medicine

## 2011-10-01 ENCOUNTER — Other Ambulatory Visit: Payer: Self-pay | Admitting: Family Medicine

## 2011-10-01 VITALS — BP 120/70 | HR 78 | Resp 16 | Ht 63.5 in | Wt 195.4 lb

## 2011-10-01 DIAGNOSIS — R5381 Other malaise: Secondary | ICD-10-CM

## 2011-10-01 DIAGNOSIS — E119 Type 2 diabetes mellitus without complications: Secondary | ICD-10-CM

## 2011-10-01 DIAGNOSIS — E669 Obesity, unspecified: Secondary | ICD-10-CM

## 2011-10-01 DIAGNOSIS — Z139 Encounter for screening, unspecified: Secondary | ICD-10-CM

## 2011-10-01 DIAGNOSIS — Z23 Encounter for immunization: Secondary | ICD-10-CM

## 2011-10-01 DIAGNOSIS — E785 Hyperlipidemia, unspecified: Secondary | ICD-10-CM

## 2011-10-01 DIAGNOSIS — I10 Essential (primary) hypertension: Secondary | ICD-10-CM

## 2011-10-01 DIAGNOSIS — C50919 Malignant neoplasm of unspecified site of unspecified female breast: Secondary | ICD-10-CM

## 2011-10-01 NOTE — Patient Instructions (Signed)
CPE in 5 weeks.  Labs today.  Mammogram asap past due we will schedule.   You need to sign to get dates of procedures for your form

## 2011-10-02 LAB — LIPID PANEL
Cholesterol: 167 mg/dL (ref 0–200)
HDL: 36 mg/dL — ABNORMAL LOW (ref >39)
Total CHOL/HDL Ratio: 4.6 Ratio
Triglycerides: 135 mg/dL (ref <150)

## 2011-10-02 LAB — COMPLETE METABOLIC PANEL WITH GFR
Albumin: 4.2 g/dL (ref 3.5–5.2)
Alkaline Phosphatase: 105 U/L (ref 39–117)
BUN: 11 mg/dL (ref 6–23)
Calcium: 9.4 mg/dL (ref 8.4–10.5)
GFR, Est Non African American: >90 mL/min (ref >90)
Glucose, Bld: 90 mg/dL (ref 70–99)
Potassium: 3.5 mEq/L (ref 3.5–5.3)

## 2011-10-02 LAB — CBC WITH DIFFERENTIAL/PLATELET
Eosinophils Absolute: 0.2 10*3/uL (ref 0.0–0.7)
Lymphocytes Relative: 41 % (ref 12–46)
Lymphs Abs: 3.8 10*3/uL (ref 0.7–4.0)
Neutro Abs: 4.4 10*3/uL (ref 1.7–7.7)
Neutrophils Relative %: 48 % (ref 43–77)
Platelets: 281 10*3/uL (ref 150–400)
RBC: 4.76 MIL/uL (ref 3.87–5.11)
WBC: 9.2 10*3/uL (ref 4.0–10.5)

## 2011-10-02 LAB — HEMOGLOBIN A1C: Hgb A1c MFr Bld: 7.7 % — ABNORMAL HIGH (ref <5.7)

## 2011-10-02 LAB — MICROALBUMIN / CREATININE URINE RATIO: Creatinine, Urine: 266.7 mg/dL

## 2011-10-02 NOTE — Assessment & Plan Note (Signed)
Improved but uncontrolled, dose increase on metformin

## 2011-10-02 NOTE — Assessment & Plan Note (Signed)
" >>  ASSESSMENT AND PLAN FOR TYPE II DIABETES MELLITUS (HCC) WRITTEN ON 10/02/2011  9:39 PM BY Canna Nickelson E, MD  Improved but uncontrolled, dose increase on metformin  "

## 2011-10-04 ENCOUNTER — Ambulatory Visit (HOSPITAL_COMMUNITY): Payer: Medicare Other
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
## 2011-10-07 NOTE — Assessment & Plan Note (Signed)
Improved. Pt applauded on succesful weight loss through lifestyle change, and encouraged to continue same. Weight loss goal set for the next several months.  

## 2011-10-07 NOTE — Assessment & Plan Note (Signed)
Controlled, no change in medication  

## 2011-10-07 NOTE — Assessment & Plan Note (Signed)
LDL slightly elevated, dietary change only, pt to continue current dose of pravastatin

## 2011-10-24 ENCOUNTER — Ambulatory Visit (HOSPITAL_COMMUNITY)
Admission: RE | Admit: 2011-10-24 | Discharge: 2011-10-24 | Disposition: A | Discharge status: Home / Self Care | Payer: Medicare Other | Source: Ambulatory Visit | Attending: Family Medicine | Admitting: Family Medicine

## 2011-10-24 DIAGNOSIS — C50919 Malignant neoplasm of unspecified site of unspecified female breast: Secondary | ICD-10-CM | POA: Insufficient documentation

## 2011-10-31 ENCOUNTER — Encounter: Payer: Self-pay | Admitting: Family Medicine

## 2011-11-05 ENCOUNTER — Encounter: Payer: Self-pay | Admitting: Family Medicine

## 2011-11-05 ENCOUNTER — Ambulatory Visit (INDEPENDENT_AMBULATORY_CARE_PROVIDER_SITE_OTHER): Payer: Medicare Other | Admitting: Family Medicine

## 2011-11-05 ENCOUNTER — Other Ambulatory Visit (HOSPITAL_COMMUNITY)
Admission: RE | Admit: 2011-11-05 | Discharge: 2011-11-05 | Disposition: A | Discharge status: Home / Self Care | Payer: Medicare Other | Source: Ambulatory Visit | Attending: Family Medicine | Admitting: Family Medicine

## 2011-11-05 VITALS — BP 122/74 | HR 68 | Resp 16 | Ht 64.25 in | Wt 197.1 lb

## 2011-11-05 DIAGNOSIS — C50919 Malignant neoplasm of unspecified site of unspecified female breast: Secondary | ICD-10-CM

## 2011-11-05 DIAGNOSIS — E119 Type 2 diabetes mellitus without complications: Secondary | ICD-10-CM

## 2011-11-05 DIAGNOSIS — E785 Hyperlipidemia, unspecified: Secondary | ICD-10-CM

## 2011-11-05 DIAGNOSIS — Z0000 Encounter for general adult medical examination without abnormal findings: Secondary | ICD-10-CM

## 2011-11-05 DIAGNOSIS — Z124 Encounter for screening for malignant neoplasm of cervix: Secondary | ICD-10-CM | POA: Insufficient documentation

## 2011-11-05 DIAGNOSIS — B351 Tinea unguium: Secondary | ICD-10-CM

## 2011-11-05 DIAGNOSIS — Z01419 Encounter for gynecological examination (general) (routine) without abnormal findings: Secondary | ICD-10-CM

## 2011-11-05 DIAGNOSIS — I10 Essential (primary) hypertension: Secondary | ICD-10-CM

## 2011-11-05 MED ORDER — TERBINAFINE HCL 250 MG PO TABS: 250.0000 mg | ORAL_TABLET | Freq: Every day | ORAL | Status: AC

## 2011-11-05 NOTE — Patient Instructions (Addendum)
F/u in 3.5 months.  You will be referred to Dr Jerelyn Scott, and i will send a letter in advance also, I recommend STRONGLY that you accept definitive treatment for the cancer in your right breast.  INCREASE in metformin to 500mg    one tablet THREE times daily   You need to take pravastatin 20mg  ONE at night for cholesterol.  New medication is being prescribed for the fungal infection of feet and toenails.  A prescription for diabetic shoes will be sent [facility] once I hear from them   HBA1C cmp and EGFR and lipid panel fasting in 3.5 months , just before next visit  You are being referred for an eye exam

## 2011-11-05 NOTE — Assessment & Plan Note (Signed)
Recurrent onychomycosis on left foot esp great toe, also tinea pedis and callus

## 2011-11-05 NOTE — Progress Notes (Signed)
  Subjective:    Patient ID: [name], female    DOB: July 24, 1956, 55 y.o.   MRN: [id number]  HPI The PT is here for follow anual exam and re-evaluation of chronic medical conditions, medication management and review of any available recent lab and radiology data.  Preventive health is updated, specifically  Cancer screening and Immunization.   Questions or concerns regarding consultations or procedures which the PT has had in the interim are  addressed. The PT denies any adverse reactions to current medications since the last visit.  Reports she is now willing /interested in talking with oncologist re treatment options for breast cancer. Wants med for fungal toenail infection.       Review of Systems See HPI Denies recent fever or chills. Denies sinus pressure, nasal congestion, ear pain or sore throat. Denies chest congestion, productive cough or wheezing. Denies chest pains, palpitations and leg swelling Denies abdominal pain, nausea, vomiting,diarrhea or constipation.   Denies dysuria, frequency, hesitancy or incontinence. Denies joint pain, swelling and limitation in mobility. Denies headaches, seizures, numbness, or tingling. Denies depression, or insomnia.Reports improvement in her depression since relocating, she does have anxiety re breast cancer dx Denies skin break down or rash.        Objective:   Physical Exam Pleasant well nourished female, alert and oriented x 3, in no cardio-pulmonary distress. Afebrile. HEENT No facial trauma or asymetry. Sinuses non tender.  EOMI, PERTL, fundoscopic exam is normal, no hemorhage or exudate.  External ears normal, tympanic membranes clear. Oropharynx moist, no exudate, goopoor  dentition. Neck: supple, no adenopathy,JVD or thyromegaly.No bruits.  Chest: Clear to ascultation bilaterally.No crackles or wheezes. Non tender to palpation  Breast: No asymetry,no masses. No nipple discharge or inversion. No axillary or  supraclavicular adenopathy  Cardiovascular system; Heart sounds normal,  S1 and  S2 ,no S3.  No murmur, or thrill. Apical beat not displaced Peripheral pulses normal.  Abdomen: Soft, non tender, no organomegaly or masses. No bruits. Bowel sounds normal. No guarding, tenderness or rebound.  Rectal:  No mass. Guaiac negative stool.  GU: External genitalia normal. No lesions. Vaginal canal normal.No discharge. Uterus normal size, no adnexal masses, no cervical motion or adnexal tenderness.  Musculoskeletal exam: Full ROM of spine, hips , shoulders and knees. No deformity ,swelling or crepitus noted. No muscle wasting or atrophy.   Neurologic: Cranial nerves 2 to 12 intact. Power, tone ,sensation and reflexes normal throughout. No disturbance in gait. No tremor.  Skin: Intact, no ulceration, erythema , scaling or rash noted. Pigmentation normal throughout  Psych; Normal mood and affect. Judgement and concentration normal  Diabetic Foot Check:  Appearance -  calluses Skin - no unusual pallor or redness, onychomycosis Sensation - grossly intact to light touch Monofilament testing -  Right - Great toe, medial, central, lateral ball and posterior foot diminished Left - Great toe, medial, central, lateral ball and posterior foot diminished Pulses Left - Dorsalis Pedis and Posterior Tibia normal Right - Dorsalis Pedis and Posterior Tibia normal       Assessment & Plan:

## 2011-11-06 ENCOUNTER — Other Ambulatory Visit: Payer: Self-pay | Admitting: Family Medicine

## 2011-11-11 NOTE — Assessment & Plan Note (Signed)
Not at goal , medication added and the importance of a low fat diet stressed

## 2011-11-11 NOTE — Assessment & Plan Note (Signed)
" >>  ASSESSMENT AND PLAN FOR TYPE II DIABETES MELLITUS (HCC) WRITTEN ON 11/11/2011  8:52 PM BY Jeric Slagel E, MD  Improved but still uncontrolled, med adjustment made, also importance of following a low carb diet stressed "

## 2011-11-11 NOTE — Assessment & Plan Note (Signed)
Controlled, no change in medication  

## 2011-11-11 NOTE — Assessment & Plan Note (Signed)
Improved but still uncontrolled, med adjustment made, also importance of following a low carb diet stressed

## 2011-11-11 NOTE — Assessment & Plan Note (Signed)
Pt was diagnosed in 2011, has resisted any treatment to date due to fear of surgery in particular, is interested in discussing treatment options with the local oncologist who she has seen in the past due to [facility], will refer

## 2011-11-21 ENCOUNTER — Ambulatory Visit (HOSPITAL_COMMUNITY): Payer: Medicare Other | Admitting: Oncology

## 2011-12-12 ENCOUNTER — Ambulatory Visit (HOSPITAL_COMMUNITY): Payer: Medicare Other | Admitting: Oncology

## 2012-01-15 ENCOUNTER — Ambulatory Visit (HOSPITAL_COMMUNITY): Payer: Medicare Other | Admitting: Oncology

## 2012-02-27 ENCOUNTER — Encounter: Payer: Self-pay | Admitting: Family Medicine

## 2012-02-27 ENCOUNTER — Ambulatory Visit (INDEPENDENT_AMBULATORY_CARE_PROVIDER_SITE_OTHER): Payer: Medicare Other | Admitting: Family Medicine

## 2012-02-27 VITALS — BP 120/76 | HR 83 | Resp 18 | Ht 64.25 in | Wt 198.1 lb

## 2012-02-27 DIAGNOSIS — E119 Type 2 diabetes mellitus without complications: Secondary | ICD-10-CM

## 2012-02-27 DIAGNOSIS — I10 Essential (primary) hypertension: Secondary | ICD-10-CM

## 2012-02-27 DIAGNOSIS — E785 Hyperlipidemia, unspecified: Secondary | ICD-10-CM

## 2012-02-27 DIAGNOSIS — C50919 Malignant neoplasm of unspecified site of unspecified female breast: Secondary | ICD-10-CM

## 2012-02-27 DIAGNOSIS — E669 Obesity, unspecified: Secondary | ICD-10-CM

## 2012-02-27 MED ORDER — PRAVASTATIN SODIUM 20 MG PO TABS: 20.0000 mg | ORAL_TABLET | Freq: Every day | ORAL | Status: DC

## 2012-02-27 NOTE — Progress Notes (Signed)
  Subjective:    Patient ID: [name], female    DOB: 1956-08-20, 56 y.o.   MRN: [id number]  HPI The PT is here for follow up and re-evaluation of chronic medical conditions, medication management and review of any available recent lab and radiology data.  Preventive health is updated, specifically  Cancer screening and Immunization.   Questions or concerns regarding consultations or procedures which the PT has had in the interim are  addressed. The PT denies any adverse reactions to current medications since the last visit.  There are no new concerns.  There are no specific complaints       Review of Systems See HPI Denies recent fever or chills. Denies sinus pressure, nasal congestion, ear pain or sore throat. Denies chest congestion, productive cough or wheezing. Denies chest pains, palpitations and leg swelling Denies abdominal pain, nausea, vomiting,diarrhea or constipation.   Denies dysuria, frequency, hesitancy or incontinence. Denies joint pain, swelling and limitation in mobility. Denies headaches, seizures, numbness, or tingling. Denies depression, anxiety or insomnia. Denies skin break down or rash. Still refusing definitive treatment for breast cancer, states healed through faith        Objective:   Physical Exam Patient alert and oriented and in no cardiopulmonary distress.  HEENT: No facial asymmetry, EOMI, no sinus tenderness,  oropharynx pink and moist.  Neck supple no adenopathy.  Chest: Clear to auscultation bilaterally.  CVS: S1, S2 no murmurs, no S3.  ABD: Soft non tender. Bowel sounds normal.  Ext: No edema  MS: Adequate ROM spine, shoulders, hips and knees.  Skin: Intact, no ulcerations or rash noted.  Psych: Good eye contact, normal affect. Memory intact not anxious or depressed appearing.  CNS: CN 2-12 intact, power, tone and sensation normal throughout. Diabetic Foot Check:  Appearance - no lesions, ulcers or calluses Skin - no  unusual pallor or redness. Partially removed right great toe nail Sensation - grossly intact to light touch Monofilament testing -  Right - Great toe, medial, central, lateral ball and posterior foot diminished Left - Great toe, medial, central, lateral ball and posterior foot diminished Pulses Left - Dorsalis Pedis and Posterior Tibia normal Right - Dorsalis Pedis and Posterior Tibia normal        Assessment & Plan:

## 2012-02-27 NOTE — Patient Instructions (Signed)
F/u in 4 month  It is important that you exercise regularly at least 30 minutes 5 times a week. If you develop chest pain, have severe difficulty breathing, or feel very tired, stop exercising immediately and seek medical attention  A healthy diet is rich in fruit, vegetables and whole grains. Poultry fish, nuts and beans are a healthy choice for protein rather then red meat. A low sodium diet and drinking 64 ounces of water daily is generally recommended. Oils and sweet should be limited. Carbohydrates especially for those who are diabetic or overweight, should be limited to 30-45 gram per meal. It is important to eat on a regular schedule, at least 3 times daily. Snacks should be primarily fruits, vegetables or nuts.  Weight loss goal of 4 pound per month.  Please call and let me know if you wANT TO DO ANYTHING DIFFERENT.  hba1c NON FASTING IN 4 MONTH. pREFERABLY DO IN [place] PLEASE

## 2012-03-02 NOTE — Assessment & Plan Note (Signed)
Hyperlipidemia:Low fat diet discussed and encouraged.  Continue current medication 

## 2012-03-02 NOTE — Assessment & Plan Note (Signed)
" >>  ASSESSMENT AND PLAN FOR TYPE II DIABETES MELLITUS (HCC) WRITTEN ON 03/02/2012  7:13 PM BY Yandiel Bergum E, MD  Controlled, no change in medication  "

## 2012-03-02 NOTE — Assessment & Plan Note (Signed)
Controlled, no change in medication  

## 2012-03-02 NOTE — Assessment & Plan Note (Signed)
Despite multiple discussions and referrals, pt refuses treatment, states she is cured by faith

## 2012-03-02 NOTE — Assessment & Plan Note (Signed)
Unchanged. Patient re-educated about  the importance of commitment to a  minimum of 150 minutes of exercise per week. The importance of healthy food choices with portion control discussed. Encouraged to start a food diary, count calories and to consider  joining a support group. Sample diet sheets offered. Goals set by the patient for the next several months.    

## 2012-03-18 ENCOUNTER — Encounter: Payer: Self-pay | Admitting: Family Medicine

## 2012-04-08 ENCOUNTER — Other Ambulatory Visit: Payer: Self-pay | Admitting: Family Medicine

## 2012-07-01 ENCOUNTER — Ambulatory Visit: Payer: Medicare Other | Admitting: Family Medicine

## 2012-07-16 ENCOUNTER — Encounter: Payer: Self-pay | Admitting: Family Medicine

## 2012-07-16 ENCOUNTER — Ambulatory Visit (INDEPENDENT_AMBULATORY_CARE_PROVIDER_SITE_OTHER): Payer: Medicare Other | Admitting: Family Medicine

## 2012-07-16 VITALS — BP 120/70 | HR 77 | Resp 16 | Ht 64.5 in | Wt 199.4 lb

## 2012-07-16 DIAGNOSIS — E785 Hyperlipidemia, unspecified: Secondary | ICD-10-CM

## 2012-07-16 DIAGNOSIS — E119 Type 2 diabetes mellitus without complications: Secondary | ICD-10-CM

## 2012-07-16 DIAGNOSIS — I10 Essential (primary) hypertension: Secondary | ICD-10-CM

## 2012-07-16 DIAGNOSIS — B354 Tinea corporis: Secondary | ICD-10-CM | POA: Insufficient documentation

## 2012-07-16 DIAGNOSIS — E669 Obesity, unspecified: Secondary | ICD-10-CM

## 2012-07-16 MED ORDER — CLOTRIMAZOLE-BETAMETHASONE 1-0.05 % EX CREA: TOPICAL_CREAM | Freq: Two times a day (BID) | CUTANEOUS | Status: DC

## 2012-07-16 NOTE — Assessment & Plan Note (Signed)
Hyperlipidemia:Low fat diet discussed and encouraged.  Will check profile next draw, non fasting today and labs are past due

## 2012-07-16 NOTE — Progress Notes (Signed)
  Subjective:    Patient ID: [name], female    DOB: [birth year], 56 y.o.   MRN: [id number]  HPI The PT is here for follow up and re-evaluation of chronic medical conditions, medication management and review of any available recent lab and radiology data.  Preventive health is updated, specifically  Cancer screening and Immunization. Still unwilling to have breast biopsy proposed 2 years ago  Questions or concerns regarding consultations or procedures which the PT has had in the interim are  addressed. The PT denies any adverse reactions to current medications since the last visit.  There are no new concerns.  Not testing sugar regularly and non compliant with diet      Review of Systems See HPI Denies recent fever or chills. Denies sinus pressure, nasal congestion, ear pain or sore throat. Denies chest congestion, productive cough or wheezing. Denies chest pains, palpitations and leg swelling Denies abdominal pain, nausea, vomiting,diarrhea or constipation.   Denies dysuria, frequency, hesitancy or incontinence. Denies joint pain, swelling and limitation in mobility. Denies headaches, seizures, numbness, or tingling. Denies depression, anxiety or insomnia. C/o pruritic rash on forearms and upper back        Objective:   Physical Exam  Patient alert and oriented and in no cardiopulmonary distress.  HEENT: No facial asymmetry, EOMI, no sinus tenderness,  oropharynx pink and moist.  Neck supple no adenopathy.  Chest: Clear to auscultation bilaterally.  CVS: S1, S2 no murmurs, no S3.  ABD: Soft non tender. Bowel sounds normal.  Ext: No edema  MS: Adequate ROM spine, shoulders, hips and knees.  Skin: Intact, no fungal rash on forearms and upper back  Psych: Good eye contact, normal affect. Memory intact not anxious or depressed appearing.  CNS: CN 2-12 intact, power, tone and sensation normal throughout.       Assessment & Plan:

## 2012-07-16 NOTE — Assessment & Plan Note (Signed)
Patient advised to reduce carb and sweets, commit to regular physical activity, take meds as prescribed, test blood as directed, and attempt to lose weight, to improve blood sugar control.  

## 2012-07-16 NOTE — Assessment & Plan Note (Signed)
Unchanged. Patient re-educated about  the importance of commitment to a  minimum of 150 minutes of exercise per week. The importance of healthy food choices with portion control discussed. Encouraged to start a food diary, count calories and to consider  joining a support group. Sample diet sheets offered. Goals set by the patient for the next several months.    

## 2012-07-16 NOTE — Assessment & Plan Note (Signed)
" >>  ASSESSMENT AND PLAN FOR TYPE II DIABETES MELLITUS ([facility]) WRITTEN ON 07/16/2012  9:07 PM BY Ayaan Ringle E, MD  Patient advised to reduce carb and sweets, commit to regular physical activity, take meds as prescribed, test blood as directed, and attempt to lose weight, to improve blood sugar control.  "

## 2012-07-16 NOTE — Patient Instructions (Addendum)
Annual wellness in January.Please call if you need me before  Call in October for flu vaccine  HBA1C , cmp and EGFR  Today   It is important that you exercise regularly at least 30 minutes 5 times a week. If you develop chest pain, have severe difficulty breathing, or feel very tired, stop exercising immediately and seek medical attention   A healthy diet is rich in fruit, vegetables and whole grains. Poultry fish, nuts and beans are a healthy choice for protein rather then red meat. A low sodium diet and drinking 64 ounces of water daily is generally recommended. Oils and sweet should be limited. Carbohydrates especially for those who are diabetic or overweight, should be limited to 30-45 gram per meal. It is important to eat on a regular schedule, at least 3 times daily. Snacks should be primarily fruits, vegetables or nuts.

## 2012-07-16 NOTE — Assessment & Plan Note (Signed)
Controlled, no change in medication DASH diet and commitment to daily physical activity for a minimum of 30 minutes discussed and encouraged, as a part of hypertension management. The importance of attaining a healthy weight is also discussed.  

## 2012-07-16 NOTE — Assessment & Plan Note (Signed)
Current infection on upper back and forearms, med prescribed

## 2012-07-17 LAB — COMPLETE METABOLIC PANEL WITH GFR
ALT: 26 U/L (ref 0–35)
AST: 21 U/L (ref 0–37)
Alkaline Phosphatase: 95 U/L (ref 39–117)
Creat: 0.57 mg/dL (ref 0.50–1.10)
Sodium: 139 mEq/L (ref 135–145)
Total Bilirubin: 0.3 mg/dL (ref 0.3–1.2)
Total Protein: 7.6 g/dL (ref 6.0–8.3)

## 2012-07-17 LAB — HEMOGLOBIN A1C
Hgb A1c MFr Bld: 7.4 % — ABNORMAL HIGH (ref <5.7)
Mean Plasma Glucose: 166 mg/dL — ABNORMAL HIGH (ref <117)

## 2012-07-30 ENCOUNTER — Other Ambulatory Visit: Payer: Self-pay

## 2012-07-30 MED ORDER — GLUCOSE BLOOD VI STRP: ORAL_STRIP | Status: DC

## 2012-08-09 ENCOUNTER — Other Ambulatory Visit: Payer: Self-pay | Admitting: Family Medicine

## 2012-10-09 ENCOUNTER — Ambulatory Visit (INDEPENDENT_AMBULATORY_CARE_PROVIDER_SITE_OTHER): Payer: Medicare Other

## 2012-10-09 DIAGNOSIS — Z23 Encounter for immunization: Secondary | ICD-10-CM

## 2012-11-24 ENCOUNTER — Other Ambulatory Visit: Payer: Self-pay | Admitting: Family Medicine

## 2012-12-10 ENCOUNTER — Telehealth: Payer: Self-pay | Admitting: Family Medicine

## 2012-12-10 DIAGNOSIS — I10 Essential (primary) hypertension: Secondary | ICD-10-CM

## 2012-12-10 DIAGNOSIS — E785 Hyperlipidemia, unspecified: Secondary | ICD-10-CM

## 2012-12-10 DIAGNOSIS — E119 Type 2 diabetes mellitus without complications: Secondary | ICD-10-CM

## 2012-12-10 NOTE — Telephone Encounter (Signed)
Will fax to [facility] center

## 2012-12-11 LAB — COMPLETE METABOLIC PANEL WITH GFR
Albumin: 4.2 g/dL (ref 3.5–5.2)
BUN: 12 mg/dL (ref 6–23)
CO2: 28 mEq/L (ref 19–32)
Calcium: 9.9 mg/dL (ref 8.4–10.5)
Chloride: 105 mEq/L (ref 96–112)
Creat: 0.65 mg/dL (ref 0.50–1.10)
GFR, Est African American: >89 mL/min
GFR, Est Non African American: >89 mL/min
Glucose, Bld: 137 mg/dL — ABNORMAL HIGH (ref 70–99)
Potassium: 3.9 mEq/L (ref 3.5–5.3)

## 2012-12-11 LAB — LIPID PANEL
Cholesterol: 156 mg/dL (ref 0–200)
Triglycerides: 103 mg/dL (ref <150)
VLDL: 21 mg/dL (ref 0–40)

## 2012-12-11 LAB — HEMOGLOBIN A1C: Mean Plasma Glucose: 166 mg/dL — ABNORMAL HIGH (ref <117)

## 2012-12-16 ENCOUNTER — Encounter: Payer: Self-pay | Admitting: Family Medicine

## 2012-12-16 ENCOUNTER — Other Ambulatory Visit: Payer: Self-pay | Admitting: Family Medicine

## 2012-12-16 ENCOUNTER — Ambulatory Visit (INDEPENDENT_AMBULATORY_CARE_PROVIDER_SITE_OTHER): Payer: Medicare Other | Admitting: Family Medicine

## 2012-12-16 VITALS — BP 120/82 | HR 82 | Resp 18 | Ht 64.5 in | Wt 198.0 lb

## 2012-12-16 DIAGNOSIS — C50919 Malignant neoplasm of unspecified site of unspecified female breast: Secondary | ICD-10-CM

## 2012-12-16 DIAGNOSIS — E119 Type 2 diabetes mellitus without complications: Secondary | ICD-10-CM

## 2012-12-16 DIAGNOSIS — E785 Hyperlipidemia, unspecified: Secondary | ICD-10-CM

## 2012-12-16 DIAGNOSIS — Z0000 Encounter for general adult medical examination without abnormal findings: Secondary | ICD-10-CM

## 2012-12-16 DIAGNOSIS — R5381 Other malaise: Secondary | ICD-10-CM

## 2012-12-16 DIAGNOSIS — M79609 Pain in unspecified limb: Secondary | ICD-10-CM

## 2012-12-16 DIAGNOSIS — R5383 Other fatigue: Secondary | ICD-10-CM

## 2012-12-16 DIAGNOSIS — M79673 Pain in unspecified foot: Secondary | ICD-10-CM

## 2012-12-16 DIAGNOSIS — D509 Iron deficiency anemia, unspecified: Secondary | ICD-10-CM

## 2012-12-16 MED ORDER — METFORMIN HCL 500 MG PO TABS: 500.0000 mg | ORAL_TABLET | Freq: Two times a day (BID) | ORAL | Status: DC

## 2012-12-16 NOTE — Progress Notes (Signed)
Subjective:    Patient ID: [name], female    DOB: 1956/07/30, 57 y.o.   MRN: [id number]  HPI Preventive Screening-Counseling & Management   Patient present here today for a Medicare annual wellness visit.   Current Problems (verified)   Medications Prior to Visit Allergies (verified)   PAST HISTORY  Family History: 9 living sibs, oldest sister has breast cancer, mother living age 85, IDDM, father died at age 78 lung disease from coal mine  Social History Divorced mother of 1 son,. Never smoke, alcohol or street drugs. 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 No  Over the past two weeks, have you felt little interest or pleasure in doing things? No  Have you lost interest or pleasure in daily life? No  Do you often feel hopeless? No  Do you cry easily over simple problems? No   Activities of Daily Living  In your present state of health, do you have any difficulty performing the following activities?  Driving?: No Managing money?: No Feeding yourself?:No Getting from bed to chair?:No Climbing a flight of stairs?:No Preparing food and eating?:No Bathing or showering?:No Getting dressed?:No Getting to the toilet?:No Using the toilet?:No Moving around from place to place?: No  Fall Risk Assessment In the past year have you fallen or had a near fall?:tripped over dog fell once Are you currently taking any medications that make you dizziness?:No   Hearing Difficulties: No Do you often ask people to speak up or repeat  themselves?:No Do you experience ringing or noises in your ears?:No Do you have difficulty understanding soft or whispered voices?:No  Cognitive Testing  Alert? Yes Normal Appearance?Yes  Oriented to person? Yes Place? Yes  Time? Yes  Displays appropriate judgment?Yes  Can read the correct time from a watch face? yes Are you having problems remembering things?at times.mMSE 28/30  Advanced Directives have been discussed with the patient?Yes , full code   List the Names of Other Physician/Practitioners you currently use: [facility]   Indicate any recent Medical Services you may have received from other than [facility] providers in the past year (date may be approximate).   Assessment:    Annual Wellness Exam   Plan:    During the course of the visit the patient was educated and counseled about appropriate screening and preventive services including:  A healthy diet is rich in fruit, vegetables and whole grains. Poultry fish, nuts and beans are a healthy choice for protein rather then red meat. A low sodium diet and drinking 64 ounces of water daily is generally recommended. Oils and sweet should be limited. Carbohydrates especially for those who are diabetic or overweight, should be limited to 30-45 gram per meal. It is important to eat on a regular schedule, at least 3 times daily. Snacks should be primarily fruits, vegetables or nuts. It is important that you exercise regularly at least 30 minutes 5 times a week. If you develop chest pain, have severe difficulty breathing, or feel very tired, stop exercising immediately and seek medical attention  Immunization reviewed  and updated. Cancer screening reviewed and updated    Patient Instructions (the written plan) was given to the patient.  Medicare Attestation  I have personally reviewed:  The patient's medical and social history  Their use of alcohol, tobacco or illicit drugs  Their current medications and supplements  The  patient's functional ability including ADLs,fall risks, home safety risks, cognitive, and hearing and visual impairment  Diet and physical activities  Evidence for depression or mood disorders  The patient's weight, height, BMI, and visual acuity have been recorded in the chart. I have made referrals, counseling, and provided education to the patient based on review of the above and I have provided the patient with a written personalized care plan for preventive services.       Review of Systems     Objective:   Physical Exam        Assessment & Plan:

## 2012-12-16 NOTE — Patient Instructions (Addendum)
F/u in 4 month, diabetic foot exam, and rectal  Please RECONSIDER surgical evaluation of your breast.  Please schedule your mammogram  For first of month we will schedule    You NEED aspirin 81mg  daily for stroke risk reduction   Blood sugar is not controlled Take metformin and cholesterol medicine every day as prescribed  Memory evaluation today and microalb  HBA1C and chem 7 and TSH in 4 month

## 2012-12-17 LAB — MICROALBUMIN / CREATININE URINE RATIO: Microalb Creat Ratio: 10.9 mg/g (ref 0.0–30.0)

## 2012-12-17 MED ORDER — RAMIPRIL 5 MG PO CAPS: ORAL_CAPSULE | ORAL | Status: DC

## 2012-12-22 ENCOUNTER — Telehealth: Payer: Self-pay | Admitting: Family Medicine

## 2012-12-22 DIAGNOSIS — Z0000 Encounter for general adult medical examination without abnormal findings: Secondary | ICD-10-CM | POA: Insufficient documentation

## 2012-12-22 NOTE — Assessment & Plan Note (Signed)
Annual wellness completed as documented. Pt encouraged strongly to reconsider further investigation and treatment of breast cancer, still relying on natural cure. Also encouraged to commit to healthy lifestyle with daily physical activity and dietary change to increase veg and fruit intake End of life discussed, she had not thought of thsi before and was appreciative

## 2012-12-23 ENCOUNTER — Ambulatory Visit: Payer: Medicare Other | Admitting: Family Medicine

## 2012-12-23 NOTE — Telephone Encounter (Signed)
Spoke with patient and she will come in for appointment.  Left ankle swollen and knees bruised.

## 2012-12-25 ENCOUNTER — Encounter: Payer: Self-pay | Admitting: Family Medicine

## 2012-12-25 ENCOUNTER — Ambulatory Visit (INDEPENDENT_AMBULATORY_CARE_PROVIDER_SITE_OTHER): Payer: Medicare Other | Admitting: Family Medicine

## 2012-12-25 VITALS — BP 110/78 | HR 76 | Resp 16 | Ht 64.5 in | Wt 197.0 lb

## 2012-12-25 DIAGNOSIS — E119 Type 2 diabetes mellitus without complications: Secondary | ICD-10-CM

## 2012-12-25 DIAGNOSIS — M25561 Pain in right knee: Secondary | ICD-10-CM

## 2012-12-25 DIAGNOSIS — M25572 Pain in left ankle and joints of left foot: Secondary | ICD-10-CM

## 2012-12-25 DIAGNOSIS — M25569 Pain in unspecified knee: Secondary | ICD-10-CM

## 2012-12-25 DIAGNOSIS — L089 Local infection of the skin and subcutaneous tissue, unspecified: Secondary | ICD-10-CM

## 2012-12-25 DIAGNOSIS — C50919 Malignant neoplasm of unspecified site of unspecified female breast: Secondary | ICD-10-CM

## 2012-12-25 DIAGNOSIS — IMO0002 Reserved for concepts with insufficient information to code with codable children: Secondary | ICD-10-CM

## 2012-12-25 DIAGNOSIS — I10 Essential (primary) hypertension: Secondary | ICD-10-CM

## 2012-12-25 DIAGNOSIS — Z23 Encounter for immunization: Secondary | ICD-10-CM

## 2012-12-25 DIAGNOSIS — M25579 Pain in unspecified ankle and joints of unspecified foot: Secondary | ICD-10-CM

## 2012-12-25 MED ORDER — CEPHALEXIN 500 MG PO CAPS: 500.0000 mg | ORAL_CAPSULE | Freq: Four times a day (QID) | ORAL | Status: AC

## 2012-12-25 NOTE — Progress Notes (Signed)
  Subjective:    Patient ID: [name], female    DOB: [birth year], 57 y.o.   MRN: [id number]  HPI Larey Seat outside of a store 6 days ago, she has abrasions to both knees, and also c/o bilateral swelling of both knees right worse  Than left and also c/o painful swelling of left ankle with deformity, also has pain with weight bearing on the ankle Landed on both hands also, no skin broken on the hands, still sore but has been using them crocheting   Review of Systems See HPI Denies recent fever or chills. Denies sinus pressure, nasal congestion, ear pain or sore throat. Denies chest congestion, productive cough or wheezing. Denies chest pains, palpitations and leg swelling Denies abdominal pain, nausea, vomiting,diarrhea or constipation.   Denies dysuria, frequency, hesitancy or incontinence.  Denies headaches, seizures, numbness, or tingling. Denies depression, anxiety or insomnia.        Objective:   Physical Exam  Patient alert and oriented and in no cardiopulmonary distress.  HEENT: No facial asymmetry, EOMI, no sinus tenderness,  oropharynx pink and moist.  Neck supple no adenopathy.  Chest: Clear to auscultation bilaterally.  CVS: S1, S2 no murmurs, no S3.  ABD: Soft non tender. Bowel sounds normal.  Ext: No edema  MS: decreased ROM both knees with swelling of the righ knee and eryhthema,open abrasions to both knees, approx 1.5 cm on right , and 1 cm on left. Left ankle swollen tender with reduced ROM  Skin:open abrasions on both knees  Psych: Good eye contact, normal affect. Memory intact not anxious or depressed appearing.  CNS: CN 2-12 intact, power, tone and sensation normal throughout.       Assessment & Plan:

## 2012-12-25 NOTE — Patient Instructions (Addendum)
F/u as before.  TdAP today for open lacerations  Antibiotic prescribed due to lacerations, please keep skin clean and dry.   Please go for xrays of both knees and left ankle today at the hospital [facility]  You are referred to DrKeeling

## 2012-12-25 NOTE — Assessment & Plan Note (Addendum)
TdAP and keflex due to recent trauma with skin breakdown and obvious infection

## 2012-12-26 ENCOUNTER — Telehealth: Payer: Self-pay | Admitting: Family Medicine

## 2012-12-26 NOTE — Telephone Encounter (Signed)
Patient is aware 

## 2012-12-31 ENCOUNTER — Encounter (HOSPITAL_COMMUNITY): Payer: Medicare Other

## 2013-01-02 ENCOUNTER — Other Ambulatory Visit: Payer: Self-pay

## 2013-01-02 DIAGNOSIS — E119 Type 2 diabetes mellitus without complications: Secondary | ICD-10-CM

## 2013-01-02 MED ORDER — PRAVASTATIN SODIUM 20 MG PO TABS: 20.0000 mg | ORAL_TABLET | Freq: Every day | ORAL | Status: DC

## 2013-01-02 MED ORDER — RAMIPRIL 5 MG PO CAPS: ORAL_CAPSULE | ORAL | Status: DC

## 2013-01-02 MED ORDER — METFORMIN HCL 500 MG PO TABS: 500.0000 mg | ORAL_TABLET | Freq: Two times a day (BID) | ORAL | Status: DC

## 2013-01-05 ENCOUNTER — Ambulatory Visit (HOSPITAL_COMMUNITY)
Admission: RE | Admit: 2013-01-05 | Discharge: 2013-01-05 | Disposition: A | Discharge status: Home / Self Care | Payer: Medicare Other | Source: Ambulatory Visit | Attending: Family Medicine | Admitting: Family Medicine

## 2013-01-05 DIAGNOSIS — M25473 Effusion, unspecified ankle: Secondary | ICD-10-CM | POA: Insufficient documentation

## 2013-01-05 DIAGNOSIS — M25572 Pain in left ankle and joints of left foot: Secondary | ICD-10-CM

## 2013-01-05 DIAGNOSIS — S82409A Unspecified fracture of shaft of unspecified fibula, initial encounter for closed fracture: Secondary | ICD-10-CM | POA: Insufficient documentation

## 2013-01-05 DIAGNOSIS — M25476 Effusion, unspecified foot: Secondary | ICD-10-CM | POA: Insufficient documentation

## 2013-01-05 DIAGNOSIS — M25561 Pain in right knee: Secondary | ICD-10-CM

## 2013-01-05 DIAGNOSIS — W19XXXA Unspecified fall, initial encounter: Secondary | ICD-10-CM | POA: Insufficient documentation

## 2013-01-05 DIAGNOSIS — M25569 Pain in unspecified knee: Secondary | ICD-10-CM | POA: Insufficient documentation

## 2013-01-12 ENCOUNTER — Encounter: Payer: Self-pay | Admitting: Orthopedic Surgery

## 2013-01-12 ENCOUNTER — Ambulatory Visit (INDEPENDENT_AMBULATORY_CARE_PROVIDER_SITE_OTHER): Payer: Medicare Other | Admitting: Orthopedic Surgery

## 2013-01-12 VITALS — Ht 64.5 in | Wt 192.0 lb

## 2013-01-12 DIAGNOSIS — S8010XA Contusion of unspecified lower leg, initial encounter: Secondary | ICD-10-CM

## 2013-01-12 DIAGNOSIS — S8012XA Contusion of left lower leg, initial encounter: Secondary | ICD-10-CM

## 2013-01-12 DIAGNOSIS — S8263XA Displaced fracture of lateral malleolus of unspecified fibula, initial encounter for closed fracture: Secondary | ICD-10-CM | POA: Insufficient documentation

## 2013-01-12 DIAGNOSIS — S8001XA Contusion of right knee, initial encounter: Secondary | ICD-10-CM

## 2013-01-12 DIAGNOSIS — S8000XA Contusion of unspecified knee, initial encounter: Secondary | ICD-10-CM

## 2013-01-12 DIAGNOSIS — S93402A Sprain of unspecified ligament of left ankle, initial encounter: Secondary | ICD-10-CM

## 2013-01-12 DIAGNOSIS — S8002XA Contusion of left knee, initial encounter: Secondary | ICD-10-CM

## 2013-01-12 DIAGNOSIS — S93409A Sprain of unspecified ligament of unspecified ankle, initial encounter: Secondary | ICD-10-CM

## 2013-01-12 DIAGNOSIS — M7660 Achilles tendinitis, unspecified leg: Secondary | ICD-10-CM

## 2013-01-12 NOTE — Progress Notes (Signed)
Patient ID: [name], female   DOB: 01-02-1956, 57 y.o.   MRN: [id number] Chief Complaint  Patient presents with  . Knee Pain    Referral from Dr. Lodema Hong for bilateral knee pain status post fall. DOI 12-21-12.    Ht 5' 4.5" (1.638 m)  Wt 192 lb (87.091 kg)  BMI 32.46 kg/m2  Past Medical History  Diagnosis Date  . Allergic rhinitis   . Anemia, deficiency   . GERD (gastroesophageal reflux disease)   . Obesity   . Diabetes mellitus   . Hypertension     Review of systems this patient reports fever, redness, shortness of breath, frequency and urgency, skin changes numbness tingling nervousness depression easy bleeding seasonal allergies rash redness and muscle pain.  History the patient was referred to [place] by Dr. Syliva Overman. [address] 2014 she stepped off a curb and twisted her ankle and fell onto both of her knees sustaining abrasions to both knees the left ankle injury which was evaluated and found to be a avulsion fracture with sprain and exacerbation of her right Achilles tendinitis was also noted at this time. She was treated with rest activity modification topical antibiotic for the abrasions.  She comes in today with complaints of for sharp burning 7/10 intermittent pain in her left ankle with soreness in her left knee pain in the right ankle at the Achilles left lower leg pain from the ankle radiating up into the knee as well.  Exam findings  Vital signs are noted General appearance normal Orientation x3 normal Mood and affect normal Skin over both knees normal Pulse and perfusion in both lower extremities normal Right and left lower extremity: Left start with the right there is abrasion over the right knee the range of motion is full there's no joint line tenderness she is tender over the right Achilles with a pump bump knee and ankle are stable muscle strength and tone normal.  Left lower extremity abrasion over the left knee no signs of infection no joint line  tenderness no swelling knee is stable strength is normal scans intact as stated  Left ankle tenderness at the tip of the malleolus as well as anterior talofibular ligament slight decrease in dorsiflexion normal plantar flexion ankle is stable  Impression 1. Achilles bursitis or tendinitis    Right  2. Left ankle sprain, initial encounter   3. Closed high lateral malleolus fracture, left, closed, initial encounter   4. Contusion, knee and lower leg, left, initial encounter   5. Contusion, knee, right, initial encounter     Plan recommend ASO brace for the left ankle sprain the fracture is basically an avulsion injury. The contusions in the knee should be self-limiting. The malleolus fracture will heal with the ankle sprain and Achilles tendinitis can be treated with home exercise program  The patient will followup in approximately one month for reevaluation and further imaging if needed but doubtful this will be necessary

## 2013-01-12 NOTE — Patient Instructions (Addendum)
Wear brace left ankle till next visit  Ankle exercises for both ankles   Achilles Tendinitis  with Rehab Achilles tendinitis is a disorder of the Achilles tendon. The Achilles tendon connects the large calf muscles (Gastrocnemius and Soleus) to the heel bone (calcaneus). This tendon is sometimes called the heel cord. It is important for pushing-off and standing on your toes and is important for walking, running, or jumping. Tendinitis is often caused by overuse and repetitive microtrauma. SYMPTOMS  Pain, tenderness, swelling, warmth, and redness may occur over the Achilles tendon even at rest.  Pain with pushing off, or flexing or extending the ankle.  Pain that is worsened after or during activity. CAUSES   Overuse sometimes seen with rapid increase in exercise programs or in sports requiring running and jumping.  Poor physical conditioning (strength and flexibility or endurance).  Running sports, especially training running down hills.  Inadequate warm-up before practice or play or failure to stretch before participation.  Injury to the tendon. PREVENTION   Warm up and stretch before practice or competition.  Allow time for adequate rest and recovery between practices and competition.  Keep up conditioning.  Keep up ankle and leg flexibility.  Improve or keep muscle strength and endurance.  Improve cardiovascular fitness.  Use proper technique.  Use proper equipment (shoes, skates).  To help prevent recurrence, taping, protective strapping, or an adhesive bandage may be recommended for several weeks after healing is complete. PROGNOSIS   Recovery may take weeks to several months to heal.  Longer recovery is expected if symptoms have been prolonged.  Recovery is usually quicker if the inflammation is due to a direct blow as compared with overuse or sudden strain. RELATED COMPLICATIONS   Healing time will be prolonged if the condition is not correctly treated. The  injury must be given plenty of time to heal.  Symptoms can reoccur if activity is resumed too soon.  Untreated, tendinitis may increase the risk of tendon rupture requiring additional time for recovery and possibly surgery. TREATMENT   The first treatment consists of rest anti-inflammatory medication, and ice to relieve the pain.  Stretching and strengthening exercises after resolution of pain will likely help reduce the risk of recurrence. Referral to a physical therapist or athletic trainer for further evaluation and treatment may be helpful.  A walking boot or cast may be recommended to rest the Achilles tendon. This can help break the cycle of inflammation and microtrauma.  Arch supports (orthotics) may be prescribed or recommended by your caregiver as an adjunct to therapy and rest.  Surgery to remove the inflamed tendon lining or degenerated tendon tissue is rarely necessary and has shown less than predictable results. MEDICATION   Nonsteroidal anti-inflammatory medications, such as aspirin and ibuprofen, may be used for pain and inflammation relief. Do not take within 7 days before surgery. Take these as directed by your caregiver. Contact your caregiver immediately if any bleeding, stomach upset, or signs of allergic reaction occur. Other minor pain relievers, such as acetaminophen, may also be used.  Pain relievers may be prescribed as necessary by your caregiver. Do not take prescription pain medication for longer than 4 to 7 days. Use only as directed and only as much as you need.  Cortisone injections are rarely indicated. Cortisone injections may weaken tendons and predispose to rupture. It is better to give the condition more time to heal than to use them. HEAT AND COLD  Cold is used to relieve pain and reduce inflammation  for acute and chronic Achilles tendinitis. Cold should be applied for 10 to 15 minutes every 2 to 3 hours for inflammation and pain and immediately after any  activity that aggravates your symptoms. Use ice packs or an ice massage.  Heat may be used before performing stretching and strengthening activities prescribed by your caregiver. Use a heat pack or a warm soak. SEEK MEDICAL CARE IF:  Symptoms get worse or do not improve in 2 weeks despite treatment.  New, unexplained symptoms develop. Drugs used in treatment may produce side effects. EXERCISES RANGE OF MOTION (ROM) AND STRETCHING EXERCISES - Achilles Tendinitis  These exercises may help you when beginning to rehabilitate your injury. Your symptoms may resolve with or without further involvement from your physician, physical therapist or athletic trainer. While completing these exercises, remember:   Restoring tissue flexibility helps normal motion to return to the joints. This allows healthier, less painful movement and activity.  An effective stretch should be held for at least 30 seconds.  A stretch should never be painful. You should only feel a gentle lengthening or release in the stretched tissue. STRETCH  Gastroc, Standing   Place hands on wall.  Extend right / left leg, keeping the front knee somewhat bent.  Slightly point your toes inward on your back foot.  Keeping your right / left heel on the floor and your knee straight, shift your weight toward the wall, not allowing your back to arch.  You should feel a gentle stretch in the right / left calf. Hold this position for __________ seconds. Repeat __________ times. Complete this stretch __________ times per day. STRETCH  Soleus, Standing   Place hands on wall.  Extend right / left leg, keeping the other knee somewhat bent.  Slightly point your toes inward on your back foot.  Keep your right / left heel on the floor, bend your back knee, and slightly shift your weight over the back leg so that you feel a gentle stretch deep in your back calf.  Hold this position for __________ seconds. Repeat __________ times. Complete  this stretch __________ times per day. STRETCH  Gastrocsoleus, Standing  Note: This exercise can place a lot of stress on your foot and ankle. Please complete this exercise only if specifically instructed by your caregiver.   Place the ball of your right / left foot on a step, keeping your other foot firmly on the same step.  Hold on to the wall or a rail for balance.  Slowly lift your other foot, allowing your body weight to press your heel down over the edge of the step.  You should feel a stretch in your right / left calf.  Hold this position for __________ seconds.  Repeat this exercise with a slight bend in your knee. Repeat __________ times. Complete this stretch __________ times per day.  STRENGTHENING EXERCISES - Achilles Tendinitis These exercises may help you when beginning to rehabilitate your injury. They may resolve your symptoms with or without further involvement from your physician, physical therapist or athletic trainer. While completing these exercises, remember:   Muscles can gain both the endurance and the strength needed for everyday activities through controlled exercises.  Complete these exercises as instructed by your physician, physical therapist or athletic trainer. Progress the resistance and repetitions only as guided.  You may experience muscle soreness or fatigue, but the pain or discomfort you are trying to eliminate should never worsen during these exercises. If this pain does worsen, stop and  make certain you are following the directions exactly. If the pain is still present after adjustments, discontinue the exercise until you can discuss the trouble with your clinician. STRENGTH - Plantar-flexors   Sit with your right / left leg extended. Holding onto both ends of a rubber exercise band/tubing, loop it around the ball of your foot. Keep a slight tension in the band.  Slowly push your toes away from you, pointing them downward.  Hold this position for  __________ seconds. Return slowly, controlling the tension in the band/tubing. Repeat __________ times. Complete this exercise __________ times per day.  STRENGTH - Plantar-flexors   Stand with your feet shoulder width apart. Steady yourself with a wall or table using as little support as needed.  Keeping your weight evenly spread over the width of your feet, rise up on your toes.*  Hold this position for __________ seconds. Repeat __________ times. Complete this exercise __________ times per day.  *If this is too easy, shift your weight toward your right / left leg until you feel challenged. Ultimately, you may be asked to do this exercise with your right / left foot only. STRENGTH  Plantar-flexors, Eccentric  Note: This exercise can place a lot of stress on your foot and ankle. Please complete this exercise only if specifically instructed by your caregiver.   Place the balls of your feet on a step. With your hands, use only enough support from a wall or rail to keep your balance.  Keep your knees straight and rise up on your toes.  Slowly shift your weight entirely to your right / left toes and pick up your opposite foot. Gently and with controlled movement, lower your weight through your right / left foot so that your heel drops below the level of the step. You will feel a slight stretch in the back of your calf at the end position.  Use the healthy leg to help rise up onto the balls of both feet, then lower weight only on the right / left leg again. Build up to 15 repetitions. Then progress to 3 consecutive sets of 15 repetitions.*  After completing the above exercise, complete the same exercise with a slight knee bend (about 30 degrees). Again, build up to 15 repetitions. Then progress to 3 consecutive sets of 15 repetitions.* Perform this exercise __________ times per day.  *When you easily complete 3 sets of 15, your physician, physical therapist or athletic trainer may advise you to  add resistance by wearing a backpack filled with additional weight. STRENGTH - Plantar Flexors, Seated   Sit on a chair that allows your feet to rest flat on the ground. If necessary, sit at the edge of the chair.  Keeping your toes firmly on the ground, lift your right / left heel as far as you can without increasing any discomfort in your ankle. Repeat __________ times. Complete this exercise __________ times a day. *If instructed by your physician, physical therapist or athletic trainer, you may add ____________________ of resistance by placing a weighted object on your right / left knee. Document Released: 06/20/2005 Document Revised: 02/11/2012 Document Reviewed: 03/03/2009 [facility] Patient Information 2013 [place], [place].

## 2013-01-14 ENCOUNTER — Ambulatory Visit (HOSPITAL_COMMUNITY)
Admission: RE | Admit: 2013-01-14 | Discharge: 2013-01-14 | Disposition: A | Discharge status: Home / Self Care | Payer: Medicare Other | Source: Ambulatory Visit | Attending: Family Medicine | Admitting: Family Medicine

## 2013-01-14 DIAGNOSIS — C50919 Malignant neoplasm of unspecified site of unspecified female breast: Secondary | ICD-10-CM | POA: Insufficient documentation

## 2013-01-16 NOTE — Assessment & Plan Note (Signed)
" >>  ASSESSMENT AND PLAN FOR TYPE II DIABETES MELLITUS ([facility]CC) WRITTEN ON 01/16/2013  5:15 PM BY Nayra Coury E, MD  Uncontrolled, closer attention to diet and medication indicated "

## 2013-01-16 NOTE — Assessment & Plan Note (Signed)
Recent mammogram suggests progression of disease, pt has continually refused treatment, I will encourage her to again meet with oncologist for treatment, as the radiologist stated, if pt is refusing treatment, obtaining mammograms seems to be not indicated

## 2013-01-16 NOTE — Assessment & Plan Note (Signed)
Uncontrolled, closer attention to diet and medication indicated

## 2013-01-16 NOTE — Assessment & Plan Note (Signed)
Localized pain and tenderness s/p fall, need xray and ortho eval

## 2013-01-16 NOTE — Assessment & Plan Note (Signed)
Controlled, no change in medication  

## 2013-01-16 NOTE — Assessment & Plan Note (Signed)
Recent fall with bilateral knee pain and swelling, xrays and orhto eval

## 2013-01-25 ENCOUNTER — Other Ambulatory Visit: Payer: Self-pay | Admitting: Family Medicine

## 2013-01-25 DIAGNOSIS — D0510 Intraductal carcinoma in situ of unspecified breast: Secondary | ICD-10-CM

## 2013-01-26 ENCOUNTER — Telehealth (HOSPITAL_COMMUNITY): Payer: Self-pay | Admitting: Oncology

## 2013-02-11 ENCOUNTER — Ambulatory Visit: Payer: Medicare Other | Admitting: Orthopedic Surgery

## 2013-02-11 ENCOUNTER — Encounter: Payer: Self-pay | Admitting: Orthopedic Surgery

## 2013-02-11 ENCOUNTER — Ambulatory Visit (HOSPITAL_COMMUNITY): Payer: Medicare Other | Admitting: Oncology

## 2013-02-11 ENCOUNTER — Telehealth: Payer: Self-pay | Admitting: Family Medicine

## 2013-02-15 NOTE — Telephone Encounter (Signed)
Noted will addrss with pt again at next visit, i have made multiple past attempts and tele calls, will continue to try, when she is next in the office

## 2013-03-05 ENCOUNTER — Ambulatory Visit (INDEPENDENT_AMBULATORY_CARE_PROVIDER_SITE_OTHER): Payer: Medicare Other | Admitting: Orthopedic Surgery

## 2013-03-05 ENCOUNTER — Encounter: Payer: Self-pay | Admitting: Orthopedic Surgery

## 2013-03-05 VITALS — BP 120/66 | Ht 64.5 in | Wt 192.0 lb

## 2013-03-05 DIAGNOSIS — Z5189 Encounter for other specified aftercare: Secondary | ICD-10-CM

## 2013-03-05 DIAGNOSIS — S93409A Sprain of unspecified ligament of unspecified ankle, initial encounter: Secondary | ICD-10-CM | POA: Insufficient documentation

## 2013-03-05 DIAGNOSIS — M7660 Achilles tendinitis, unspecified leg: Secondary | ICD-10-CM

## 2013-03-05 DIAGNOSIS — S93402D Sprain of unspecified ligament of left ankle, subsequent encounter: Secondary | ICD-10-CM

## 2013-03-05 NOTE — Progress Notes (Signed)
Patient ID: [name], female   DOB: 11-06-1956, 57 y.o.   MRN: [id number] Chief Complaint  Patient presents with  . Follow-up    follow up right achilles, left ankle , and biateral knee   Achilles bursitis or tendinitis - Plan: Ambulatory referral to Physical Therapy  Ankle sprain, left, subsequent encounter - Plan: Ambulatory referral to Physical Therapy   Visit status post several weeks of treatment for multiple injuries including a distal fibular fracture/ankle sprain Achilles tendinitis of the right ankle bilateral knee contusions  She is improving with some residual pain over the lateral ankle and posterior right ankle.  Review of systems continued knee pain  Exam BP 120/66  Ht 5' 4.5" (1.638 m)  Wt 192 lb (87.091 kg)  BMI 32.46 kg/m2 General appearance is normal, the patient is alert and oriented x3 with normal mood and affect.  Right ankle is tender and swelling is noted over the Achilles tendon and bursal tissue ankle range of motion is normal  Left ankle tender over the anterior talofibular ligament with a slight positive but firm endpoint to the drawer. Range of motion remains intact  Recommend physical therapy to improve strength on the left ankle there is a symptoms in the right ankle for the tendinitis  Return 1 month

## 2013-03-05 NOTE — Patient Instructions (Signed)
Call hospital to arrange PT  

## 2013-04-06 ENCOUNTER — Telehealth: Payer: Self-pay | Admitting: Orthopedic Surgery

## 2013-04-06 NOTE — Telephone Encounter (Addendum)
Received call from Joellyn Rued, ph# [phone], requesting an updated order for physical therapy, as patient's original order from 03/05/13 has expired -- patient had not been able to start her therapy due to other medical and financial issues per Tammy.  Patient is scheduled for initial consultation there tomorrow, 04/07/13, 1:30p.m.  Please fax an updated order to their office at fax# [phone].  *UPDATE TO THIS REQUEST* Patient called back, and states notified [place], needs to change Physical therapy order (due to insurance) request to [facility] * Fax # [phone].

## 2013-04-06 NOTE — Telephone Encounter (Signed)
Faxed order to [facility]

## 2013-04-07 ENCOUNTER — Telehealth: Payer: Self-pay | Admitting: Orthopedic Surgery

## 2013-04-07 NOTE — Telephone Encounter (Signed)
Done/bsf

## 2013-04-07 NOTE — Telephone Encounter (Signed)
[name] is scheduled for appointment to recheck both ankles 04/09/13.  You ordered therapy for the left ankle which she will not start until Monday,04/13/13. Do you still want to see her on 04/09/13 to check the right ankle or reschedule until after she has had a few therapy sessions for the left ankle?  Please advise.  Her # [phone]

## 2013-04-07 NOTE — Telephone Encounter (Signed)
Schedule for 6/12 or after 4 wks pt

## 2013-04-09 ENCOUNTER — Ambulatory Visit: Payer: Medicare Other | Admitting: Orthopedic Surgery

## 2013-04-15 ENCOUNTER — Ambulatory Visit (INDEPENDENT_AMBULATORY_CARE_PROVIDER_SITE_OTHER): Payer: Medicare Other | Admitting: Family Medicine

## 2013-04-15 ENCOUNTER — Other Ambulatory Visit: Payer: Self-pay | Admitting: Family Medicine

## 2013-04-15 ENCOUNTER — Encounter: Payer: Self-pay | Admitting: Family Medicine

## 2013-04-15 VITALS — BP 126/80 | HR 74 | Resp 18 | Wt 195.0 lb

## 2013-04-15 DIAGNOSIS — E785 Hyperlipidemia, unspecified: Secondary | ICD-10-CM

## 2013-04-15 DIAGNOSIS — D509 Iron deficiency anemia, unspecified: Secondary | ICD-10-CM

## 2013-04-15 DIAGNOSIS — I10 Essential (primary) hypertension: Secondary | ICD-10-CM

## 2013-04-15 DIAGNOSIS — E119 Type 2 diabetes mellitus without complications: Secondary | ICD-10-CM

## 2013-04-15 DIAGNOSIS — E669 Obesity, unspecified: Secondary | ICD-10-CM

## 2013-04-15 NOTE — Patient Instructions (Addendum)
F/u in 4.5 month, call if you need me before  HBa1C and cmp and EGFR today, cbc and TSH   Please keep eye appt    No more mammograms unless you decide you want standard medical treatment for breast disease  It is important that you exercise regularly at least 30 minutes 5 times a week. If you develop chest pain, have severe difficulty breathing, or feel very tired, stop exercising immediately and seek medical attention   A healthy diet is rich in fruit, vegetables and whole grains. Poultry fish, nuts and beans are a healthy choice for protein rather then red meat. A low sodium diet and drinking 64 ounces of water daily is generally recommended. Oils and sweet should be limited. Carbohydrates especially for those who are diabetic or overweight, should be limited to 34-45 gram per meal. It is important to eat on a regular schedule, at least 3 times daily. Snacks should be primarily fruits, vegetables or nuts.   Fasting lipid, cmp and EGFR, and hBA1C in 4.5 month

## 2013-04-15 NOTE — Progress Notes (Signed)
  Subjective:    Patient ID: [name], female    DOB: Mar 27, 1956, 57 y.o.   MRN: [id number]  HPI Pt originally for pelvic and breast exam however refuses this at this time Pt has had abnormal mammogram for over 2 years, highly suspicious /consistent with breast cancer, and biopsy proven disease, she continues to refuse further treatment and to chose "natural healing" She understands that as long as she continues along this course, further mammograms are of no benefit, and ill not be routinely offered Has not been testing blood sugars regularly, but does note that they are elevated when she dooes, denies polyuria, polydipsia or blurred vision    Review of Systems See HPI Denies recent fever or chills. Denies sinus pressure, nasal congestion, ear pain or sore throat. Denies chest congestion, productive cough or wheezing. Denies chest pains, palpitations and leg swelling Denies abdominal pain, nausea, vomiting,diarrhea or constipation.   Denies dysuria, frequency, hesitancy or incontinence. Denies joint pain, swelling and limitation in mobility. Denies headaches, seizures, numbness, or tingling. Denies depression, anxiety or insomnia. Denies skin break down or rash.        Objective:   Physical Exam  Patient alert and oriented and in no cardiopulmonary distress.  HEENT: No facial asymmetry, EOMI, no sinus tenderness,  oropharynx pink and moist.  Neck supple no adenopathy.  Chest: Clear to auscultation bilaterally.  CVS: S1, S2 no murmurs, no S3.  ABD: Soft non tender. Bowel sounds normal.  Ext: No edema  MS: Adequate ROM spine, shoulders, hips and knees.  Skin: Intact, no ulcerations or rash noted.  Psych: Good eye contact, normal affect. Memory intact not anxious or depressed appearing.  CNS: CN 2-12 intact, power, tone and sensation normal throughout.       Assessment & Plan:

## 2013-04-16 LAB — COMPLETE METABOLIC PANEL WITH GFR
ALT: 29 U/L (ref 0–35)
Albumin: 4 g/dL (ref 3.5–5.2)
CO2: 27 mEq/L (ref 19–32)
Calcium: 10 mg/dL (ref 8.4–10.5)
Chloride: 104 mEq/L (ref 96–112)
GFR, Est African American: >89 mL/min
GFR, Est Non African American: >89 mL/min
Glucose, Bld: 122 mg/dL — ABNORMAL HIGH (ref 70–99)
Potassium: 3.7 mEq/L (ref 3.5–5.3)
Sodium: 141 mEq/L (ref 135–145)
Total Protein: 7.1 g/dL (ref 6.0–8.3)

## 2013-04-16 LAB — CBC
MCH: 26.1 pg (ref 26.0–34.0)
MCHC: 33.3 g/dL (ref 30.0–36.0)
MCV: 78.3 fL (ref 78.0–100.0)
RBC: 4.56 MIL/uL (ref 3.87–5.11)
RDW: 15.5 % (ref 11.5–15.5)

## 2013-04-16 LAB — HEMOGLOBIN A1C: Mean Plasma Glucose: 163 mg/dL — ABNORMAL HIGH (ref <117)

## 2013-04-17 LAB — FERRITIN: Ferritin: 110 ng/mL (ref 10–291)

## 2013-04-17 MED ORDER — METFORMIN HCL 500 MG PO TABS: 500.0000 mg | ORAL_TABLET | Freq: Three times a day (TID) | ORAL | Status: DC

## 2013-04-17 MED ORDER — RAMIPRIL 5 MG PO CAPS: ORAL_CAPSULE | ORAL | Status: DC

## 2013-04-17 MED ORDER — PRAVASTATIN SODIUM 20 MG PO TABS: 20.0000 mg | ORAL_TABLET | Freq: Every day | ORAL | Status: DC

## 2013-04-17 NOTE — Progress Notes (Signed)
Labs added.

## 2013-05-07 ENCOUNTER — Ambulatory Visit: Payer: Self-pay | Admitting: Orthopedic Surgery

## 2013-05-14 ENCOUNTER — Ambulatory Visit: Payer: Self-pay | Admitting: Orthopedic Surgery

## 2013-05-14 NOTE — Assessment & Plan Note (Signed)
Hyperlipidemia:Low fat diet discussed and encouraged.  LDL slightly elevated, updated lab next visit

## 2013-05-14 NOTE — Assessment & Plan Note (Signed)
Uncontrolled, dose increase in metformin Patient advised to reduce carb and sweets, commit to regular physical activity, take meds as prescribed, test blood as directed, and attempt to lose weight, to improve blood sugar control.

## 2013-05-14 NOTE — Assessment & Plan Note (Signed)
" >>  ASSESSMENT AND PLAN FOR TYPE II DIABETES MELLITUS (HCC) WRITTEN ON 05/14/2013  6:10 AM BY Monchel Pollitt E, MD  Uncontrolled, dose increase in metformin  Patient advised to reduce carb and sweets, commit to regular physical activity, take meds as prescribed, test blood as directed, and attempt to lose weight, to improve blood sugar control.  "

## 2013-05-14 NOTE — Assessment & Plan Note (Signed)
Controlled, no change in medication DASH diet and commitment to daily physical activity for a minimum of 30 minutes discussed and encouraged, as a part of hypertension management. The importance of attaining a healthy weight is also discussed.  

## 2013-05-14 NOTE — Assessment & Plan Note (Signed)
Deteriorated. Patient re-educated about  the importance of commitment to a  minimum of 150 minutes of exercise per week. The importance of healthy food choices with portion control discussed. Encouraged to start a food diary, count calories and to consider  joining a support group. Sample diet sheets offered. Goals set by the patient for the next several months.    

## 2013-06-02 ENCOUNTER — Ambulatory Visit (INDEPENDENT_AMBULATORY_CARE_PROVIDER_SITE_OTHER): Payer: Medicare Other | Admitting: Orthopedic Surgery

## 2013-06-02 ENCOUNTER — Encounter: Payer: Self-pay | Admitting: Orthopedic Surgery

## 2013-06-02 VITALS — BP 130/86 | Ht 64.5 in | Wt 192.0 lb

## 2013-06-02 DIAGNOSIS — M7660 Achilles tendinitis, unspecified leg: Secondary | ICD-10-CM

## 2013-06-02 DIAGNOSIS — S93402D Sprain of unspecified ligament of left ankle, subsequent encounter: Secondary | ICD-10-CM

## 2013-06-02 DIAGNOSIS — Z5189 Encounter for other specified aftercare: Secondary | ICD-10-CM

## 2013-06-02 NOTE — Patient Instructions (Addendum)
Do exercises at home follow up as needed

## 2013-06-02 NOTE — Progress Notes (Signed)
Patient ID: [name], female   DOB: [birth year], 57 y.o.   MRN: [id number] Chief Complaint  Patient presents with  . Follow-up    1 month recheck left ankle sprain and right achilles   BP 130/86  Ht 5' 4.5" (1.638 m)  Wt 192 lb (87.091 kg)  BMI 32.46 kg/m2  The patient reports improvement in both right and left ankle and right and left knee. She's doing well with no problems at this time. Review of systems no numbness or tingling  Vital signs are stable she is awake alert and oriented x3 her mood and affect are normal she is walking normally both feet and ankles are nontender with full range of motion no instability  Discharge

## 2013-06-09 ENCOUNTER — Ambulatory Visit (INDEPENDENT_AMBULATORY_CARE_PROVIDER_SITE_OTHER): Payer: Medicare Other | Admitting: Family Medicine

## 2013-06-09 ENCOUNTER — Encounter: Payer: Self-pay | Admitting: Family Medicine

## 2013-06-09 VITALS — BP 124/80 | HR 68 | Resp 16 | Ht 64.5 in | Wt 187.0 lb

## 2013-06-09 DIAGNOSIS — Z20818 Contact with and (suspected) exposure to other bacterial communicable diseases: Secondary | ICD-10-CM | POA: Insufficient documentation

## 2013-06-09 DIAGNOSIS — E669 Obesity, unspecified: Secondary | ICD-10-CM

## 2013-06-09 DIAGNOSIS — E785 Hyperlipidemia, unspecified: Secondary | ICD-10-CM

## 2013-06-09 DIAGNOSIS — I10 Essential (primary) hypertension: Secondary | ICD-10-CM

## 2013-06-09 DIAGNOSIS — Z111 Encounter for screening for respiratory tuberculosis: Secondary | ICD-10-CM

## 2013-06-09 DIAGNOSIS — Z2089 Contact with and (suspected) exposure to other communicable diseases: Secondary | ICD-10-CM

## 2013-06-09 DIAGNOSIS — E119 Type 2 diabetes mellitus without complications: Secondary | ICD-10-CM

## 2013-06-09 MED ORDER — TUBERCULIN PPD 5 UNIT/0.1ML ID SOLN: 5.0000 [IU] | Freq: Once | INTRADERMAL | Status: DC

## 2013-06-09 MED ORDER — FLUCONAZOLE 150 MG PO TABS: ORAL_TABLET | ORAL | Status: AC

## 2013-06-09 MED ORDER — DOXYCYCLINE HYCLATE 100 MG PO TABS: 100.0000 mg | ORAL_TABLET | Freq: Two times a day (BID) | ORAL | Status: DC

## 2013-06-09 NOTE — Patient Instructions (Addendum)
F/u mid September.  Fasting lipid, cmp and EGFR, HBa1c, in mid September  Continue to work on weight loss, and please commit to walking  For 30 minutes every day  TB skin test today, return on Thursday or Friday for this to be read please.  PLEASE reconsider , as we discussed , seeing a specialist in [facility] for very localized removal of  Diseased breast tissue only. I do not thnkl at all that you will need the entire breast removed based on mammogram, but you need this done as soon as possible so you can GET RID OF ALL diseased tissue

## 2013-06-11 ENCOUNTER — Other Ambulatory Visit: Payer: Self-pay

## 2013-06-12 LAB — TB SKIN TEST: Induration: 0 mm

## 2013-06-14 NOTE — Assessment & Plan Note (Signed)
Improved and controlled Patient advised to reduce carb and sweets, commit to regular physical activity, take meds as prescribed, test blood as directed, and attempt to lose weight, to improve blood sugar control.  

## 2013-06-14 NOTE — Assessment & Plan Note (Signed)
Hyperlipidemia:Low fat diet discussed and encouraged.  Updated lab next visit 

## 2013-06-14 NOTE — Assessment & Plan Note (Signed)
Improved. Pt applauded on succesful weight loss through lifestyle change, and encouraged to continue same. Weight loss goal set for the next several months.  

## 2013-06-14 NOTE — Progress Notes (Signed)
  Subjective:    Patient ID: [name], female    DOB: 05/08/1956, 57 y.o.   MRN: [id number]  HPI The PT is here for follow up and re-evaluation of chronic medical conditions, medication management and review of any available recent lab and radiology data.  Preventive health is updated, specifically  Cancer screening and Immunization.   Questions or concerns regarding consultations or procedures which the PT has had in the interim are  addressed. The PT denies any adverse reactions to current medications since the last visit.  C/o possible recent exposure to MRSA due to contact with a pt she is caring for with an open wound. 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 Denies sinus pressure, nasal congestion, ear pain or sore throat. Denies chest congestion, productive cough or wheezing. Denies chest pains, palpitations and leg swelling Denies abdominal pain, nausea, vomiting,diarrhea or constipation.   Denies dysuria, frequency, hesitancy or incontinence. Denies joint pain, swelling and limitation in mobility. Denies headaches, seizures, numbness, or tingling. Denies depression, anxiety or insomnia. Denies visible skin break down or rash.        Objective:   Physical Exam  Patient alert and oriented and in no cardiopulmonary distress.  HEENT: No facial asymmetry, EOMI, no sinus tenderness,  oropharynx pink and moist.  Neck supple no adenopathy.  Chest: Clear to auscultation bilaterally.  CVS: S1, S2 no murmurs, no S3.  ABD: Soft non tender. Bowel sounds normal.  Ext: No edema  MS: Adequate ROM spine, shoulders, hips and knees.  Skin: Intact, no ulcerations or rash noted.  Psych: Good eye contact, normal affect. Memory intact not anxious or depressed appearing.  CNS: CN 2-12 intact, power, tone  and sensation normal throughout.       Assessment & Plan:

## 2013-06-14 NOTE — Assessment & Plan Note (Signed)
" >>  ASSESSMENT AND PLAN FOR TYPE II DIABETES MELLITUS (HCC) WRITTEN ON 06/14/2013 12:16 AM BY Haydon Kalmar E, MD  Improved and controlled Patient advised to reduce carb and sweets, commit to regular physical activity, take meds as prescribed, test blood as directed, and attempt to lose weight, to improve blood sugar control.  "

## 2013-06-14 NOTE — Assessment & Plan Note (Signed)
Controlled, no change in medication DASH diet and commitment to daily physical activity for a minimum of 30 minutes discussed and encouraged, as a part of hypertension management. The importance of attaining a healthy weight is also discussed.  

## 2013-06-14 NOTE — Assessment & Plan Note (Addendum)
Though not established as MRSA exposure, pt is asplenic and diabetic, prophylactic antibioitc written Importance of appropriate barrier protection and hand washing discussed

## 2013-08-07 ENCOUNTER — Other Ambulatory Visit: Payer: Self-pay | Admitting: Family Medicine

## 2013-08-24 ENCOUNTER — Ambulatory Visit: Payer: Medicare Other | Admitting: Family Medicine

## 2013-08-31 IMAGING — CR DG ANKLE COMPLETE 3+V*L*
3 series · 3 of 3 positions shown · non-contrast
Comparison: None.

CLINICAL DATA: Swelling and pain after fall

LEFT ANKLE COMPLETE - 3+ VIEW

[view not recorded (1 of 3)]
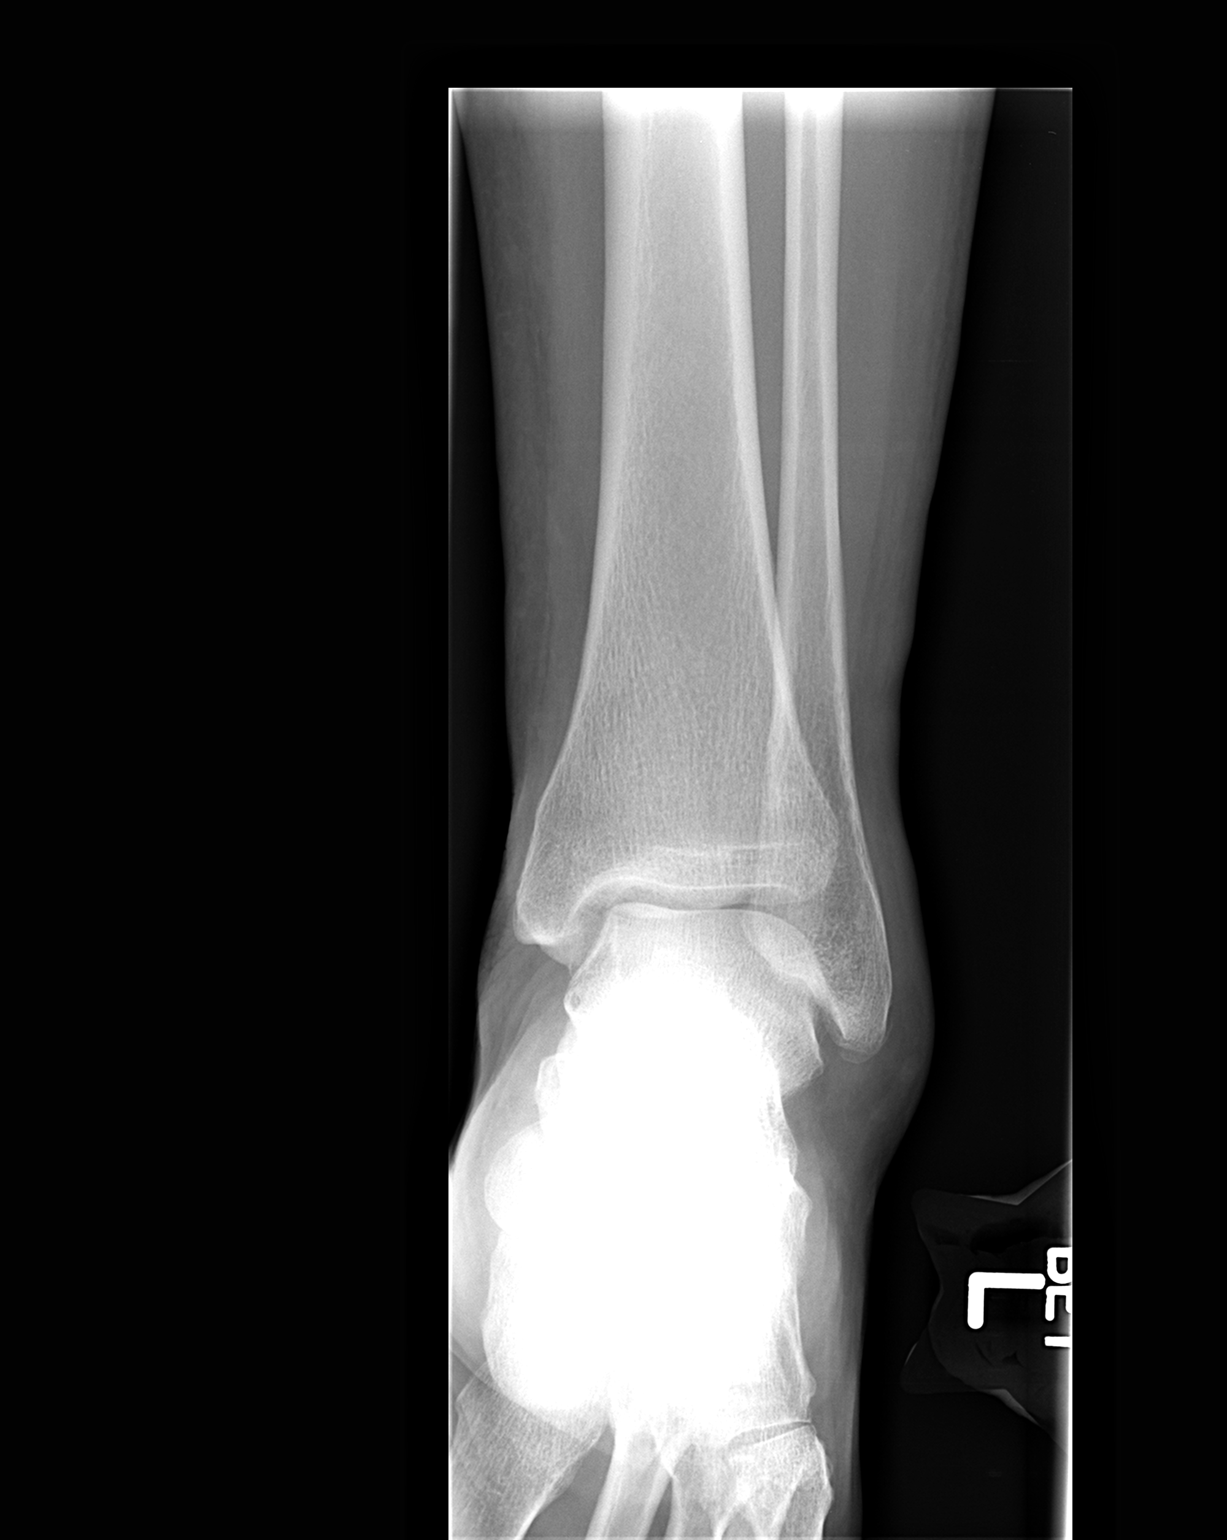

[view not recorded (2 of 3)]
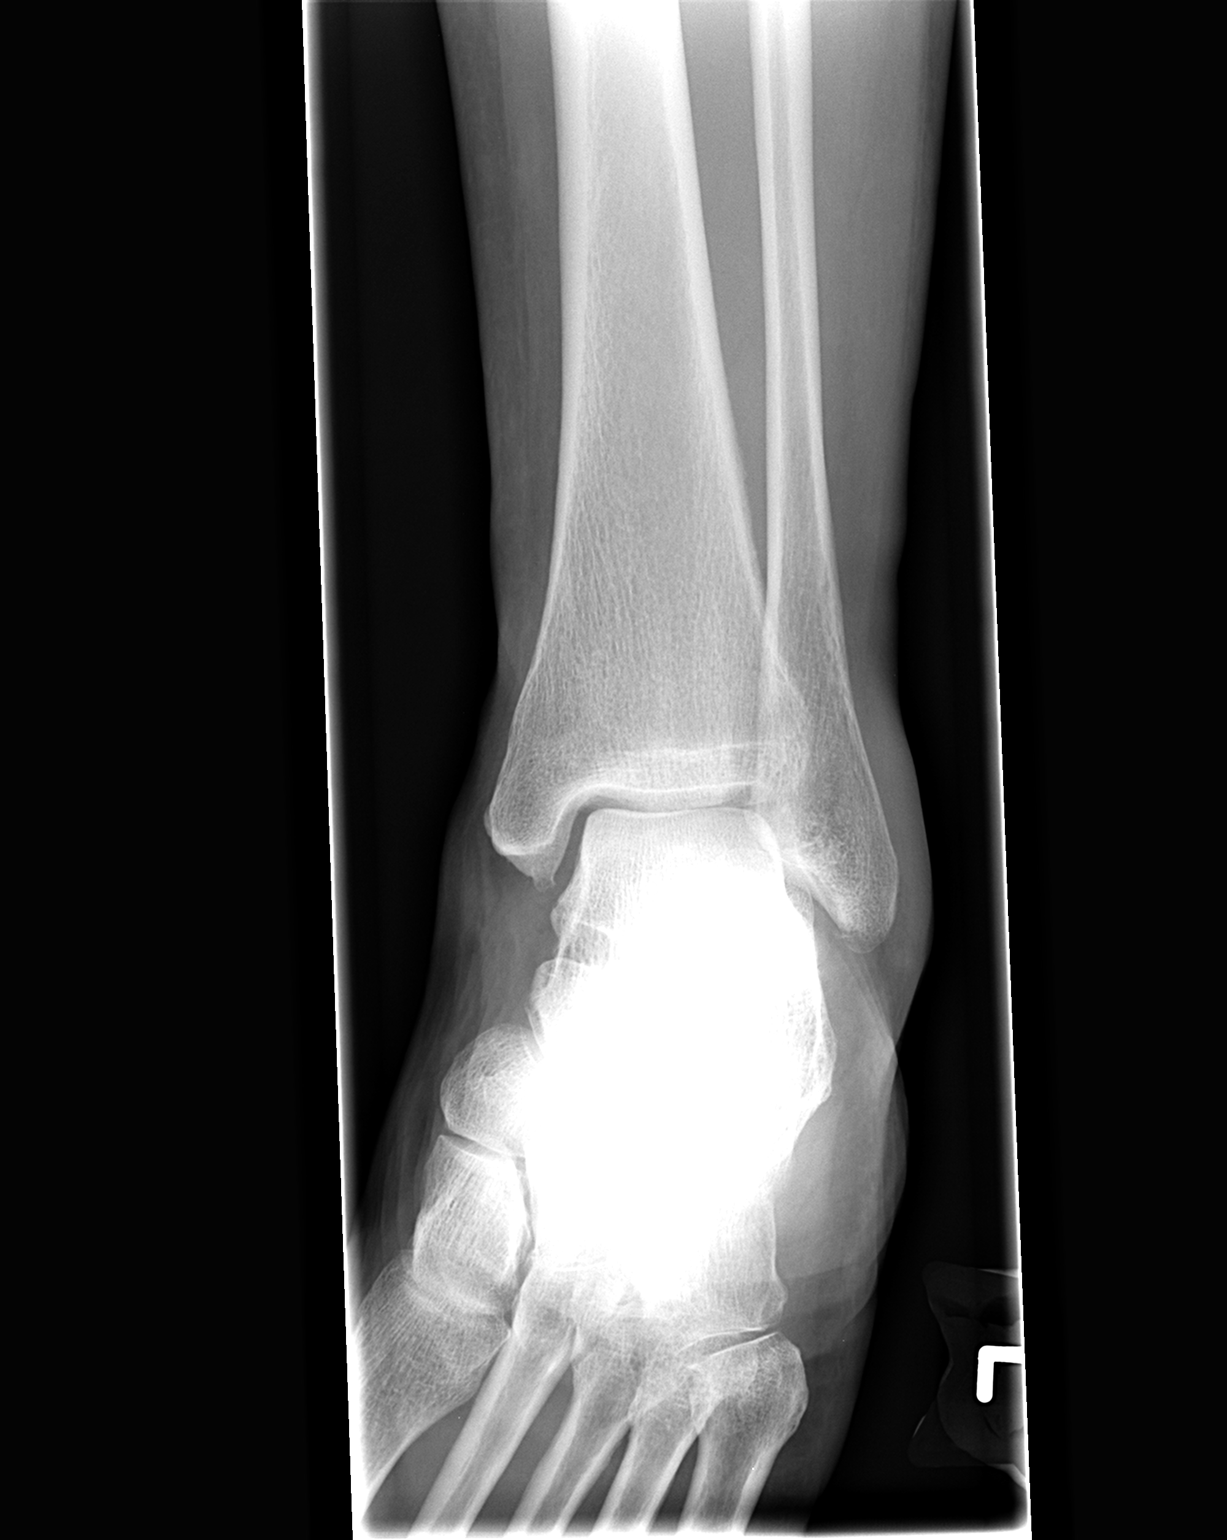

[view not recorded (3 of 3)]
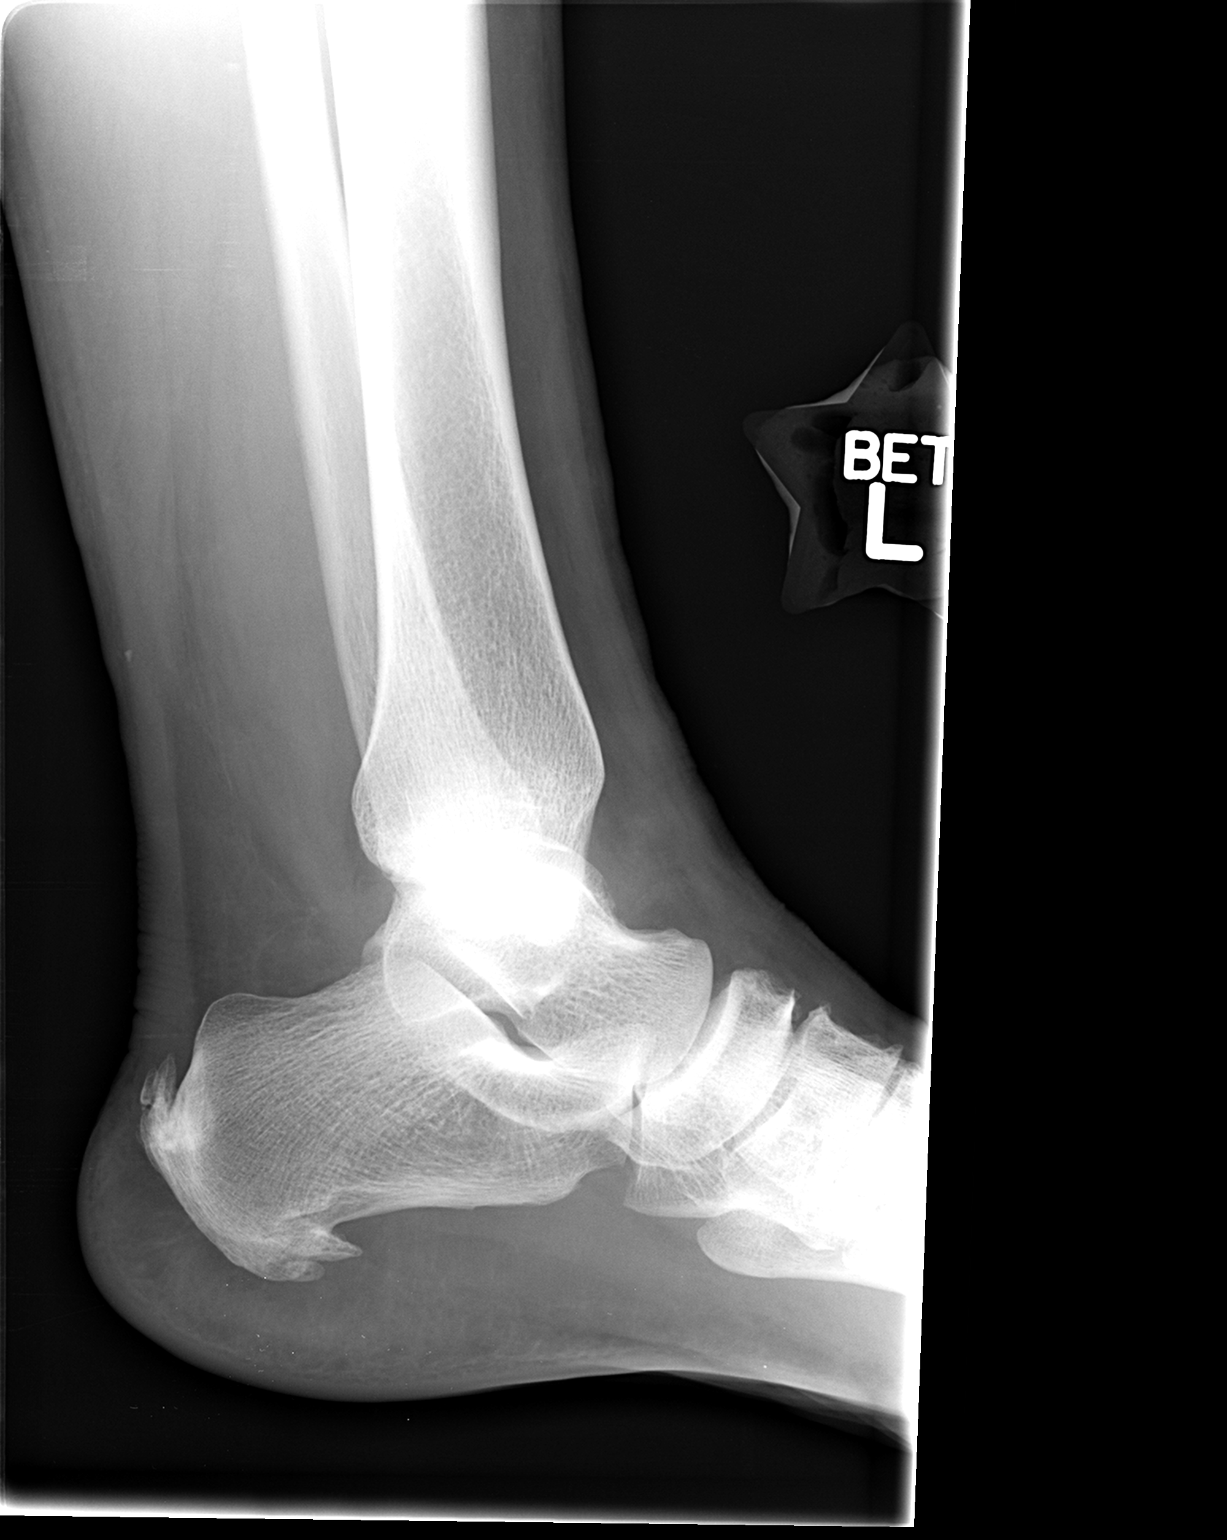

[3 of 3 positions shown; findings below may reference images not displayed]

FINDINGS: Lateral soft tissue swelling.  Mortise is intact. There
is a horizontal nondisplaced hairline fracture involving the distal
tip of the lateral malleolus.  There is a joint effusion.  There is
a moderate heel spur.
IMPRESSION: Nondisplaced avulsion fracture tip of fibula.

## 2013-09-10 ENCOUNTER — Ambulatory Visit (INDEPENDENT_AMBULATORY_CARE_PROVIDER_SITE_OTHER): Payer: Medicare Other | Admitting: Family Medicine

## 2013-09-10 ENCOUNTER — Encounter: Payer: Self-pay | Admitting: Family Medicine

## 2013-09-10 ENCOUNTER — Encounter (INDEPENDENT_AMBULATORY_CARE_PROVIDER_SITE_OTHER): Payer: Self-pay

## 2013-09-10 VITALS — BP 130/80 | HR 74 | Resp 18 | Ht 64.5 in | Wt 191.0 lb

## 2013-09-10 DIAGNOSIS — Z23 Encounter for immunization: Secondary | ICD-10-CM

## 2013-09-10 DIAGNOSIS — E785 Hyperlipidemia, unspecified: Secondary | ICD-10-CM

## 2013-09-10 DIAGNOSIS — E669 Obesity, unspecified: Secondary | ICD-10-CM

## 2013-09-10 DIAGNOSIS — R110 Nausea: Secondary | ICD-10-CM

## 2013-09-10 DIAGNOSIS — Z9119 Patient's noncompliance with other medical treatment and regimen: Secondary | ICD-10-CM

## 2013-09-10 DIAGNOSIS — K219 Gastro-esophageal reflux disease without esophagitis: Secondary | ICD-10-CM

## 2013-09-10 DIAGNOSIS — I10 Essential (primary) hypertension: Secondary | ICD-10-CM

## 2013-09-10 DIAGNOSIS — E119 Type 2 diabetes mellitus without complications: Secondary | ICD-10-CM

## 2013-09-10 MED ORDER — ONDANSETRON HCL 4 MG PO TABS: 4.0000 mg | ORAL_TABLET | Freq: Every day | ORAL | Status: DC | PRN

## 2013-09-10 NOTE — Patient Instructions (Signed)
Pelvic and breast exam in 4 month, call if you need me before  Pneumonia, and flu vaccine today  Please get fasting lipid, cmp and EGFr and HBA1C this month  It is important that you exercise regularly at least 30 minutes 5 times a week. If you develop chest pain, have severe difficulty breathing, or feel very tired, stop exercising immediately and seek medical attention   A healthy diet is rich in fruit, vegetables and whole grains. Poultry fish, nuts and beans are a healthy choice for protein rather then red meat. A low sodium diet and drinking 64 ounces of water daily is generally recommended. Oils and sweet should be limited. Carbohydrates especially for those who are diabetic or overweight, should be limited to 45 to 60 gram per meal. It is important to eat on a regular schedule, at least 3 times daily. Snacks should be primarily fruits, vegetables or nuts.

## 2013-09-12 DIAGNOSIS — Z9119 Patient's noncompliance with other medical treatment and regimen: Secondary | ICD-10-CM | POA: Insufficient documentation

## 2013-09-12 NOTE — Assessment & Plan Note (Deleted)
Uncontrolled with increased nausea, zofran as needed

## 2013-09-12 NOTE — Progress Notes (Signed)
  Subjective:    Patient ID: [name], female    DOB: 1955/12/18, 57 y.o.   MRN: [id number]  HPI The PT is here for follow up and re-evaluation of chronic medical conditions, medication management and review of any available recent lab and radiology data.  Preventive health is updated, specifically  Cancer screening and Immunization.    The PT denies any adverse reactions to current medications since the last visit.  There are no new concerns.  There are no specific complaints       Review of Systems See HPI Denies recent fever or chills. Denies sinus pressure, nasal congestion, ear pain or sore throat. Denies chest congestion, productive cough or wheezing. Denies chest pains, palpitations and leg swelling Denies abdominal pain, nausea, vomiting,diarrhea or constipation.   Denies dysuria, frequency, hesitancy or incontinence. Denies joint pain, swelling and limitation in mobility. Denies headaches, seizures, numbness, or tingling. Denies depression, anxiety or insomnia. Denies skin break down or rash.        Objective:   Physical Exam  Patient alert and oriented and in no cardiopulmonary distress.  HEENT: No facial asymmetry, EOMI, no sinus tenderness,  oropharynx pink and moist.  Neck supple no adenopathy.  Chest: Clear to auscultation bilaterally.  CVS: S1, S2 no murmurs, no S3.  ABD: Soft non tender. Bowel sounds normal.  Ext: No edema  MS: Adequate ROM spine, shoulders, hips and knees.  Skin: Intact, no ulcerations or rash noted.  Psych: Good eye contact, normal affect. Memory intact not anxious or depressed appearing.  CNS: CN 2-12 intact, power, tone and sensation normal throughout.       Assessment & Plan:

## 2013-09-12 NOTE — Assessment & Plan Note (Signed)
" >>  ASSESSMENT AND PLAN FOR TYPE II DIABETES MELLITUS (HCC) WRITTEN ON 09/12/2013  1:08 PM BY Aleric Froelich E, MD  Updated lab needed Patient advised to reduce carb and sweets, commit to regular physical activity, take meds as prescribed, test blood as directed, and attempt to lose weight, to improve blood sugar control.  "

## 2013-09-12 NOTE — Assessment & Plan Note (Signed)
Hyperlipidemia:Low fat diet discussed and encouraged.  Updated lab needed 

## 2013-09-12 NOTE — Addendum Note (Signed)
Addended by: Syliva Overman E on: 09/12/2013 01:14 PM   Modules accepted: Orders, Medications

## 2013-09-12 NOTE — Assessment & Plan Note (Addendum)
Improved. Pt applauded on succesful weight loss through lifestyle change, and encouraged to continue same. Weight loss goal set for the next several months.  

## 2013-09-12 NOTE — Assessment & Plan Note (Signed)
Controlled, no change in medication DASH diet and commitment to daily physical activity for a minimum of 30 minutes discussed and encouraged, as a part of hypertension management. The importance of attaining a healthy weight is also discussed.  

## 2013-09-12 NOTE — Assessment & Plan Note (Signed)
Updated lab needed  Patient advised to reduce carb and sweets, commit to regular physical activity, take meds as prescribed, test blood as directed, and attempt to lose weight, to improve blood sugar control.  

## 2013-09-17 ENCOUNTER — Ambulatory Visit: Payer: Medicare Other | Admitting: Family Medicine

## 2013-09-29 ENCOUNTER — Other Ambulatory Visit: Payer: Self-pay | Admitting: Family Medicine

## 2013-10-02 ENCOUNTER — Other Ambulatory Visit: Payer: Self-pay

## 2013-10-02 LAB — LIPID PANEL
Cholesterol: 129 mg/dL (ref 0–200)
HDL: 42 mg/dL (ref >39)
Total CHOL/HDL Ratio: 3.1 Ratio

## 2013-10-02 LAB — COMPLETE METABOLIC PANEL WITH GFR
ALT: 27 U/L (ref 0–35)
Albumin: 4.4 g/dL (ref 3.5–5.2)
CO2: 30 mEq/L (ref 19–32)
Calcium: 9.6 mg/dL (ref 8.4–10.5)
Chloride: 102 mEq/L (ref 96–112)
Creat: 0.67 mg/dL (ref 0.50–1.10)
GFR, Est African American: 112 mL/min
Total Protein: 7.8 g/dL (ref 6.0–8.3)

## 2013-10-02 LAB — HEMOGLOBIN A1C: Mean Plasma Glucose: 151 mg/dL — ABNORMAL HIGH (ref <117)

## 2013-10-12 ENCOUNTER — Other Ambulatory Visit: Payer: Self-pay | Admitting: Family Medicine

## 2013-10-14 ENCOUNTER — Other Ambulatory Visit: Payer: Self-pay

## 2013-10-14 DIAGNOSIS — E119 Type 2 diabetes mellitus without complications: Secondary | ICD-10-CM

## 2013-10-14 MED ORDER — METFORMIN HCL 500 MG PO TABS: 500.0000 mg | ORAL_TABLET | Freq: Three times a day (TID) | ORAL | Status: DC

## 2013-10-19 ENCOUNTER — Telehealth: Payer: Self-pay | Admitting: Family Medicine

## 2013-10-19 NOTE — Telephone Encounter (Signed)
Coming to collect form that has been completed for her mother

## 2013-12-15 ENCOUNTER — Encounter: Payer: Medicare Other | Admitting: Family Medicine

## 2014-01-21 ENCOUNTER — Encounter: Payer: Medicare Other | Admitting: Family Medicine

## 2014-02-04 ENCOUNTER — Other Ambulatory Visit: Payer: Self-pay | Admitting: Family Medicine

## 2014-02-15 ENCOUNTER — Encounter: Payer: Medicare Other | Admitting: Family Medicine

## 2014-04-07 ENCOUNTER — Other Ambulatory Visit (HOSPITAL_COMMUNITY)
Admission: RE | Admit: 2014-04-07 | Discharge: 2014-04-07 | Disposition: A | Discharge status: Home / Self Care | Payer: Medicare Other | Source: Ambulatory Visit | Attending: Family Medicine | Admitting: Family Medicine

## 2014-04-07 ENCOUNTER — Ambulatory Visit (INDEPENDENT_AMBULATORY_CARE_PROVIDER_SITE_OTHER): Payer: Medicare Other | Admitting: Family Medicine

## 2014-04-07 ENCOUNTER — Encounter: Payer: Self-pay | Admitting: Family Medicine

## 2014-04-07 ENCOUNTER — Encounter (INDEPENDENT_AMBULATORY_CARE_PROVIDER_SITE_OTHER): Payer: Self-pay

## 2014-04-07 VITALS — BP 122/78 | HR 76 | Resp 18 | Ht 64.5 in | Wt 189.1 lb

## 2014-04-07 DIAGNOSIS — Z124 Encounter for screening for malignant neoplasm of cervix: Secondary | ICD-10-CM

## 2014-04-07 DIAGNOSIS — D509 Iron deficiency anemia, unspecified: Secondary | ICD-10-CM

## 2014-04-07 DIAGNOSIS — E785 Hyperlipidemia, unspecified: Secondary | ICD-10-CM

## 2014-04-07 DIAGNOSIS — I10 Essential (primary) hypertension: Secondary | ICD-10-CM

## 2014-04-07 DIAGNOSIS — Z139 Encounter for screening, unspecified: Secondary | ICD-10-CM

## 2014-04-07 DIAGNOSIS — E119 Type 2 diabetes mellitus without complications: Secondary | ICD-10-CM

## 2014-04-07 DIAGNOSIS — Z1239 Encounter for other screening for malignant neoplasm of breast: Secondary | ICD-10-CM

## 2014-04-07 DIAGNOSIS — Z1151 Encounter for screening for human papillomavirus (HPV): Secondary | ICD-10-CM | POA: Insufficient documentation

## 2014-04-07 DIAGNOSIS — Z0000 Encounter for general adult medical examination without abnormal findings: Secondary | ICD-10-CM

## 2014-04-07 DIAGNOSIS — IMO0001 Reserved for inherently not codable concepts without codable children: Secondary | ICD-10-CM

## 2014-04-07 DIAGNOSIS — Z1211 Encounter for screening for malignant neoplasm of colon: Secondary | ICD-10-CM

## 2014-04-07 LAB — POC HEMOCCULT BLD/STL (OFFICE/1-CARD/DIAGNOSTIC): Fecal Occult Blood, POC: NEGATIVE

## 2014-04-07 MED ORDER — RAMIPRIL 5 MG PO CAPS: ORAL_CAPSULE | ORAL | Status: DC

## 2014-04-07 MED ORDER — PENICILLIN V POTASSIUM 500 MG PO TABS: 500.0000 mg | ORAL_TABLET | Freq: Three times a day (TID) | ORAL | Status: DC

## 2014-04-07 MED ORDER — FLUCONAZOLE 150 MG PO TABS: 150.0000 mg | ORAL_TABLET | Freq: Once | ORAL | Status: DC

## 2014-04-07 MED ORDER — METFORMIN HCL 500 MG PO TABS
500.0000 mg | ORAL_TABLET | Freq: Three times a day (TID) | ORAL | Status: DC
Start: 2014-04-07 — End: 2014-11-04

## 2014-04-07 MED ORDER — PRAVASTATIN SODIUM 20 MG PO TABS: ORAL_TABLET | ORAL | Status: DC

## 2014-04-07 NOTE — Patient Instructions (Addendum)
F/u in 4 month, call if you need me before  Fasting lipid, cmp and eGFR, hbA1C, CBC, TSH and vit D this week please  Microalb from office today  Foot exam is normal

## 2014-04-08 LAB — MICROALBUMIN / CREATININE URINE RATIO
Creatinine, Urine: 152.6 mg/dL
Microalb Creat Ratio: 16.4 mg/g (ref 0.0–30.0)
Microalb, Ur: 2.5 mg/dL — ABNORMAL HIGH (ref 0.00–1.89)

## 2014-04-15 ENCOUNTER — Other Ambulatory Visit: Payer: Self-pay

## 2014-04-15 LAB — COMPLETE METABOLIC PANEL WITH GFR
ALT: 23 U/L (ref 0–35)
AST: 21 U/L (ref 0–37)
Albumin: 4 g/dL (ref 3.5–5.2)
Alkaline Phosphatase: 83 U/L (ref 39–117)
BILIRUBIN TOTAL: 0.4 mg/dL (ref 0.2–1.2)
BUN: 12 mg/dL (ref 6–23)
CALCIUM: 8.8 mg/dL (ref 8.4–10.5)
CHLORIDE: 103 meq/L (ref 96–112)
CO2: 30 meq/L (ref 19–32)
CREATININE: 0.57 mg/dL (ref 0.50–1.10)
GLUCOSE: 116 mg/dL — AB (ref 70–99)
Potassium: 3.9 mEq/L (ref 3.5–5.3)
Sodium: 140 mEq/L (ref 135–145)
Total Protein: 7.4 g/dL (ref 6.0–8.3)

## 2014-04-15 LAB — CBC WITH DIFFERENTIAL/PLATELET
BASOS ABS: 0 10*3/uL (ref 0.0–0.1)
BASOS PCT: 0 % (ref 0–1)
Eosinophils Absolute: 0.3 10*3/uL (ref 0.0–0.7)
Eosinophils Relative: 3 % (ref 0–5)
HEMATOCRIT: 36.4 % (ref 36.0–46.0)
Hemoglobin: 12 g/dL (ref 12.0–15.0)
Lymphocytes Relative: 21 % (ref 12–46)
Lymphs Abs: 2.4 10*3/uL (ref 0.7–4.0)
MCH: 26.3 pg (ref 26.0–34.0)
MCHC: 33 g/dL (ref 30.0–36.0)
MCV: 79.8 fL (ref 78.0–100.0)
MONO ABS: 1.2 10*3/uL — AB (ref 0.1–1.0)
Monocytes Relative: 11 % (ref 3–12)
NEUTROS ABS: 7.3 10*3/uL (ref 1.7–7.7)
NEUTROS PCT: 65 % (ref 43–77)
PLATELETS: 263 10*3/uL (ref 150–400)
RBC: 4.56 MIL/uL (ref 3.87–5.11)
RDW: 15.6 % — AB (ref 11.5–15.5)
WBC: 11.3 10*3/uL — ABNORMAL HIGH (ref 4.0–10.5)

## 2014-04-15 LAB — VITAMIN D 25 HYDROXY (VIT D DEFICIENCY, FRACTURES): VIT D 25 HYDROXY: 46 ng/mL (ref 30–89)

## 2014-04-15 LAB — LIPID PANEL
CHOLESTEROL: 122 mg/dL (ref 0–200)
HDL: 40 mg/dL (ref >39)
LDL Cholesterol: 65 mg/dL (ref 0–99)
TRIGLYCERIDES: 84 mg/dL (ref <150)
Total CHOL/HDL Ratio: 3.1 Ratio
VLDL: 17 mg/dL (ref 0–40)

## 2014-04-15 LAB — HEMOGLOBIN A1C
HEMOGLOBIN A1C: 7 % — AB (ref <5.7)
MEAN PLASMA GLUCOSE: 154 mg/dL — AB (ref <117)

## 2014-04-15 LAB — TSH: TSH: 2.07 u[IU]/mL (ref 0.350–4.500)

## 2014-04-15 MED ORDER — PENICILLIN V POTASSIUM 500 MG PO TABS: 500.0000 mg | ORAL_TABLET | Freq: Three times a day (TID) | ORAL | Status: DC

## 2014-04-26 DIAGNOSIS — Z0000 Encounter for general adult medical examination without abnormal findings: Secondary | ICD-10-CM | POA: Insufficient documentation

## 2014-04-26 NOTE — Progress Notes (Signed)
   Subjective:    Patient ID: [name], female    DOB: Aug 31, 1956, 58 y.o.   MRN: [id number]  HPI Pt in for annual physical exam No acute health issues present and she has no concerns or complaints  Review of Systems See HPI     Objective:   Physical Exam  Pleasant well nourished female, alert and oriented x 3, in no cardio-pulmonary distress. Afebrile. HEENT No facial trauma or asymetry. Sinuses non tender.  EOMI, PERTL, fundoscopic exam no hemorhage or exudate.  External ears normal, tympanic membranes clear. Oropharynx moist, no exudate,poor dentition. Neck: supple, no adenopathy,JVD or thyromegaly.No bruits.  Chest: Clear to ascultation bilaterally.No crackles or wheezes. Non tender to palpation  Breast: No asymetry,no masses. No nipple discharge or inversion. No axillary or supraclavicular adenopathy  Cardiovascular system; Heart sounds normal,  S1 and  S2 ,no S3.  No murmur, or thrill. Apical beat not displaced Peripheral pulses normal.  Abdomen: Soft, non tender, no organomegaly or masses. No bruits. Bowel sounds normal. No guarding, tenderness or rebound.  Rectal:  No mass. Guaiac negative stool.  GU: External genitalia normal. No lesions.normal female distribution of hair Urethral meatus normal, no discharge, no prolapse, no cystocele or rectocoele Vaginal canal normal.physiologic  discharge. Uterus normal size, no adnexal masses, no cervical motion or adnexal tenderness.  Musculoskeletal exam: Full ROM of spine, hips , shoulders and knees. No deformity ,swelling or crepitus noted. No muscle wasting or atrophy.   Neurologic: Cranial nerves 2 to 12 intact. Power, tone ,sensation and reflexes normal throughout. No disturbance in gait. No tremor.  Skin: Intact, no ulceration, erythema , scaling or rash noted. Pigmentation normal throughout  Psych; Normal mood and affect. Judgement and concentration normal       Assessment & Plan:    Encounter for annual physical exam Annual exam as documented. Pap specimen sent FOB is negative Pt again strongly encouraged to proceed with definitive management of biopsy proven breast cancer , she again refuses. Immunization is up to date Healthy lifestyle discussed and encouraged

## 2014-04-26 NOTE — Assessment & Plan Note (Signed)
Annual exam as documented. Pap specimen sent FOB is negative Pt again strongly encouraged to proceed with definitive management of biopsy proven breast cancer , she again refuses. Immunization is up to date Healthy lifestyle discussed and encouraged

## 2014-05-15 NOTE — Assessment & Plan Note (Signed)
Annual exam as documented. Counseling done  re healthy lifestyle involving commitment to 150 minutes exercise per week, heart healthy diet, and attaining healthy weight.The importance of adequate sleep also discussed. Regular seat belt use , is also discussed. Changes in health habits are decided on by the patient with goals and time frames  set for achieving them. Immunization and cancer screening needs are specifically addressed at this visit.

## 2014-08-11 ENCOUNTER — Other Ambulatory Visit: Payer: Self-pay | Admitting: Family Medicine

## 2014-08-11 ENCOUNTER — Encounter: Payer: Self-pay | Admitting: Family Medicine

## 2014-08-11 ENCOUNTER — Ambulatory Visit (INDEPENDENT_AMBULATORY_CARE_PROVIDER_SITE_OTHER): Payer: Medicare Other | Admitting: Family Medicine

## 2014-08-11 ENCOUNTER — Encounter (INDEPENDENT_AMBULATORY_CARE_PROVIDER_SITE_OTHER): Payer: Self-pay

## 2014-08-11 VITALS — BP 122/70 | HR 86 | Resp 18 | Wt 191.0 lb

## 2014-08-11 DIAGNOSIS — E669 Obesity, unspecified: Secondary | ICD-10-CM

## 2014-08-11 DIAGNOSIS — IMO0001 Reserved for inherently not codable concepts without codable children: Secondary | ICD-10-CM

## 2014-08-11 DIAGNOSIS — E785 Hyperlipidemia, unspecified: Secondary | ICD-10-CM

## 2014-08-11 DIAGNOSIS — I10 Essential (primary) hypertension: Secondary | ICD-10-CM

## 2014-08-11 DIAGNOSIS — R809 Proteinuria, unspecified: Secondary | ICD-10-CM

## 2014-08-11 DIAGNOSIS — Z23 Encounter for immunization: Secondary | ICD-10-CM

## 2014-08-11 DIAGNOSIS — E119 Type 2 diabetes mellitus without complications: Secondary | ICD-10-CM

## 2014-08-11 NOTE — Assessment & Plan Note (Signed)
Controlled, no change in medication DASH diet and commitment to daily physical activity for a minimum of 30 minutes discussed and encouraged, as a part of hypertension management. The importance of attaining a healthy weight is also discussed.  

## 2014-08-11 NOTE — Assessment & Plan Note (Signed)
" >>  ASSESSMENT AND PLAN FOR TYPE II DIABETES MELLITUS (HCC) WRITTEN ON 08/15/2014  7:53 AM BY Malya Cirillo E, MD  Patient advised to reduce carb and sweets, commit to regular physical activity, take meds as prescribed, test blood as directed, and attempt to lose weight, to improve blood sugar control. Updated lab needed today; shows mild deterioration "

## 2014-08-11 NOTE — Patient Instructions (Signed)
F/u in 4 month, call if you need me before  HBA1C , chem 7 and EGFR and CBc today  Fasting lipid, cmp and EGFR and hBA1C in 4.5 month  Flu vaccine today  Blood pressure is excellent

## 2014-08-11 NOTE — Assessment & Plan Note (Signed)
Vaccine administered at visit.  

## 2014-08-11 NOTE — Assessment & Plan Note (Addendum)
Patient advised to reduce carb and sweets, commit to regular physical activity, take meds as prescribed, test blood as directed, and attempt to lose weight, to improve blood sugar control. Updated lab needed today; shows mild deterioration

## 2014-08-11 NOTE — Assessment & Plan Note (Signed)
Hyperlipidemia:Low fat diet discussed and encouraged.  Controlled, no change in medication   

## 2014-08-12 LAB — CBC
HCT: 36.3 % (ref 36.0–46.0)
Hemoglobin: 11.8 g/dL — ABNORMAL LOW (ref 12.0–15.0)
MCH: 26 pg (ref 26.0–34.0)
MCHC: 32.5 g/dL (ref 30.0–36.0)
MCV: 80 fL (ref 78.0–100.0)
Platelets: 276 10*3/uL (ref 150–400)
RBC: 4.54 MIL/uL (ref 3.87–5.11)
RDW: 15.8 % — ABNORMAL HIGH (ref 11.5–15.5)
WBC: 10.4 10*3/uL (ref 4.0–10.5)

## 2014-08-12 LAB — COMPLETE METABOLIC PANEL WITH GFR
ALBUMIN: 4.1 g/dL (ref 3.5–5.2)
ALT: 23 U/L (ref 0–35)
AST: 21 U/L (ref 0–37)
Alkaline Phosphatase: 69 U/L (ref 39–117)
BUN: 12 mg/dL (ref 6–23)
CHLORIDE: 104 meq/L (ref 96–112)
CO2: 27 meq/L (ref 19–32)
Calcium: 9.1 mg/dL (ref 8.4–10.5)
Creat: 0.57 mg/dL (ref 0.50–1.10)
GFR, Est Non African American: >89 mL/min
Glucose, Bld: 161 mg/dL — ABNORMAL HIGH (ref 70–99)
Potassium: 4.1 mEq/L (ref 3.5–5.3)
Sodium: 143 mEq/L (ref 135–145)
TOTAL PROTEIN: 7.2 g/dL (ref 6.0–8.3)
Total Bilirubin: 0.3 mg/dL (ref 0.2–1.2)

## 2014-08-12 LAB — HEMOGLOBIN A1C
Hgb A1c MFr Bld: 7.2 % — ABNORMAL HIGH (ref <5.7)
Mean Plasma Glucose: 160 mg/dL — ABNORMAL HIGH (ref <117)

## 2014-08-12 LAB — FERRITIN: Ferritin: 94 ng/mL (ref 10–291)

## 2014-08-12 LAB — IRON: IRON: 59 ug/dL (ref 42–145)

## 2014-08-15 NOTE — Assessment & Plan Note (Signed)
Unchanged. Patient re-educated about  the importance of commitment to a  minimum of 150 minutes of exercise per week. The importance of healthy food choices with portion control discussed. Encouraged to start a food diary, count calories and to consider  joining a support group. Sample diet sheets offered. Goals set by the patient for the next several months.    

## 2014-08-15 NOTE — Progress Notes (Signed)
   Subjective:    Patient ID: [name], female    DOB: 1956/09/06, 58 y.o.   MRN: [id number]  HPI The PT is here for follow up and re-evaluation of chronic medical conditions, medication management and review of any available recent lab and radiology data.  Preventive health is updated, specifically  Cancer screening and Immunization.   Questions or concerns regarding consultations or procedures which the PT has had in the interim are  addressed. The PT denies any adverse reactions to current medications since the last visit.  There are no new concerns.  There are no specific complaints       Review of Systems See HPI Denies recent fever or chills. Denies sinus pressure, nasal congestion, ear pain or sore throat. Denies chest congestion, productive cough or wheezing. Denies chest pains, palpitations and leg swelling Denies abdominal pain, nausea, vomiting,diarrhea or constipation.   Denies dysuria, frequency, hesitancy or incontinence. Denies joint pain, swelling and limitation in mobility. Denies headaches, seizures, numbness, or tingling. Denies depression, anxiety or insomnia. Denies skin break down or rash.        Objective:   Physical Exam  BP 122/70  Pulse 86  Resp 18  Wt 191 lb (86.637 kg)  SpO2 95% Patient alert and oriented and in no cardiopulmonary distress.  HEENT: No facial asymmetry, EOMI,   oropharynx pink and moist.  Neck supple no JVD, no mass.  Chest: Clear to auscultation bilaterally.  CVS: S1, S2 no murmurs, no S3.Regular rate.  ABD: Soft non tender.   Ext: No edema  MS: Adequate ROM spine, shoulders, hips and knees.  Skin: Intact, no ulcerations or rash noted.  Psych: Good eye contact, normal affect. Memory intact not anxious or depressed appearing.  CNS: CN 2-12 intact, power,  normal throughout.no focal deficits noted.       Assessment & Plan:  HYPERTENSION Controlled, no change in medication DASH diet and commitment to  daily physical activity for a minimum of 30 minutes discussed and encouraged, as a part of hypertension management. The importance of attaining a healthy weight is also discussed.   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 Updated lab needed today; shows mild deterioration  HYPERLIPIDEMIA Hyperlipidemia:Low fat diet discussed and encouraged.  .Controlled, no change in medication   Need for prophylactic vaccination and inoculation against influenza Vaccine administered at visit.   OBESITY, UNSPECIFIED Unchanged Patient re-educated about  the importance of commitment to a  minimum of 150 minutes of exercise per week. The importance of healthy food choices with portion control discussed. Encouraged to start a food diary, count calories and to consider  joining a support group. Sample diet sheets offered. Goals set by the patient for the next several months.

## 2014-08-26 ENCOUNTER — Telehealth: Payer: Self-pay | Admitting: Family Medicine

## 2014-08-30 NOTE — Telephone Encounter (Signed)
[place]

## 2014-09-30 ENCOUNTER — Telehealth: Payer: Self-pay | Admitting: Family Medicine

## 2014-09-30 NOTE — Telephone Encounter (Signed)
Pls contact pt, let her know that  [facility] has notified me that she is not filling/ taking metformin as prescribed. Remind her of dose increase to 3 tabs daily (500mg )  Needs f/unlabs to be drawn when due December or January

## 2014-10-01 NOTE — Telephone Encounter (Signed)
Message left for patient

## 2014-10-15 ENCOUNTER — Other Ambulatory Visit: Payer: Self-pay

## 2014-10-15 DIAGNOSIS — I10 Essential (primary) hypertension: Secondary | ICD-10-CM

## 2014-10-15 DIAGNOSIS — IMO0001 Reserved for inherently not codable concepts without codable children: Secondary | ICD-10-CM

## 2014-10-15 DIAGNOSIS — E785 Hyperlipidemia, unspecified: Secondary | ICD-10-CM

## 2014-11-04 ENCOUNTER — Other Ambulatory Visit: Payer: Self-pay | Admitting: Family Medicine

## 2014-11-09 ENCOUNTER — Other Ambulatory Visit: Payer: Self-pay

## 2014-11-09 DIAGNOSIS — I10 Essential (primary) hypertension: Secondary | ICD-10-CM

## 2014-11-09 DIAGNOSIS — IMO0001 Reserved for inherently not codable concepts without codable children: Secondary | ICD-10-CM

## 2014-11-09 DIAGNOSIS — E785 Hyperlipidemia, unspecified: Secondary | ICD-10-CM

## 2014-12-07 DIAGNOSIS — E785 Hyperlipidemia, unspecified: Secondary | ICD-10-CM | POA: Diagnosis not present

## 2014-12-07 DIAGNOSIS — R809 Proteinuria, unspecified: Secondary | ICD-10-CM | POA: Diagnosis not present

## 2014-12-07 DIAGNOSIS — E118 Type 2 diabetes mellitus with unspecified complications: Secondary | ICD-10-CM | POA: Diagnosis not present

## 2014-12-09 ENCOUNTER — Encounter: Payer: Self-pay | Admitting: Family Medicine

## 2014-12-09 ENCOUNTER — Ambulatory Visit: Payer: Medicare Other | Admitting: Family Medicine

## 2015-01-03 ENCOUNTER — Encounter: Payer: Self-pay | Admitting: Family Medicine

## 2015-01-03 ENCOUNTER — Ambulatory Visit (INDEPENDENT_AMBULATORY_CARE_PROVIDER_SITE_OTHER): Payer: Commercial Managed Care - HMO | Admitting: Family Medicine

## 2015-01-03 VITALS — BP 122/70 | HR 84 | Resp 18 | Ht 64.0 in | Wt 190.0 lb

## 2015-01-03 DIAGNOSIS — E119 Type 2 diabetes mellitus without complications: Secondary | ICD-10-CM | POA: Diagnosis not present

## 2015-01-03 DIAGNOSIS — IMO0001 Reserved for inherently not codable concepts without codable children: Secondary | ICD-10-CM

## 2015-01-03 DIAGNOSIS — E669 Obesity, unspecified: Secondary | ICD-10-CM | POA: Diagnosis not present

## 2015-01-03 DIAGNOSIS — I10 Essential (primary) hypertension: Secondary | ICD-10-CM | POA: Diagnosis not present

## 2015-01-03 DIAGNOSIS — E785 Hyperlipidemia, unspecified: Secondary | ICD-10-CM

## 2015-01-03 DIAGNOSIS — C50911 Malignant neoplasm of unspecified site of right female breast: Secondary | ICD-10-CM

## 2015-01-03 DIAGNOSIS — R809 Proteinuria, unspecified: Secondary | ICD-10-CM

## 2015-01-03 LAB — LAB REPORT - SCANNED
A1c: 7
LDL CALC: 94
Lipids, Total, Serum: 71

## 2015-01-03 NOTE — Progress Notes (Signed)
Subjective:    Patient ID: [name], female    DOB: 07-Jun-1956, 59 y.o.   MRN: [id number]  HPI The PT is here for follow up and re-evaluation of chronic medical conditions, medication management and review of any available recent lab and radiology data.  Preventive health is updated, specifically  Cancer screening and Immunization.   Questions or concerns regarding consultations or procedures which the PT has had in the interim are  addressed. The PT denies any adverse reactions to current medications since the last visit.  There are no new concerns.  There are no specific complaints       Review of Systems See HPI Denies recent fever or chills. Denies sinus pressure, nasal congestion, ear pain or sore throat. Denies chest congestion, productive cough or wheezing. Denies chest pains, palpitations and leg swelling Denies abdominal pain, nausea, vomiting,diarrhea or constipation.   Denies dysuria, frequency, hesitancy or incontinence. Denies joint pain, swelling and limitation in mobility. Denies headaches, seizures, numbness, or tingling. Denies depression, anxiety or insomnia. Denies skin break down or rash.        Objective:   Physical Exam BP 122/70 mmHg  Pulse 84  Resp 18  Ht 5\' 4"  (1.626 m)  Wt 190 lb (86.183 kg)  BMI 32.60 kg/m2  SpO2 95%  Patient alert and oriented and in no cardiopulmonary distress.  HEENT: No facial asymmetry, EOMI,   oropharynx pink and moist.  Neck supple no JVD, no mass.  Chest: Clear to auscultation bilaterally.  CVS: S1, S2 no murmurs, no S3.Regular rate.  ABD: Soft non tender.   Ext: No edema  MS: Adequate ROM spine, shoulders, hips and knees.  Skin: Intact, no ulcerations or rash noted.  Psych: Good eye contact, normal affect. Memory intact not anxious or depressed appearing.  CNS: CN 2-12 intact, power,  normal throughout.no focal deficits noted.         Assessment & Plan:  Essential  hypertension Controlled, no change in medication DASH diet and commitment to daily physical activity for a minimum of 30 minutes discussed and encouraged, as a part of hypertension management. The importance of attaining a healthy weight is also discussed.  BP/Weight 04/14/2015 01/03/2015 08/11/2014 04/07/2014 09/10/2013 7/0/2637 07/07/8849  Systolic BP 277 412 878 676 720 947 096  Diastolic BP 78 70 70 78 80 80 86  Wt. 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  Diabetic Labs Latest Ref Rng 04/14/2015 12/07/2014 08/11/2014 04/14/2014 04/07/2014  HbA1c <5.7 % 8.1(H) - 7.2(H) 7.0(H) -  Microalbumin <2.0 mg/dL 2.9(H) - - - 2.50(H)  Micro/Creat Ratio 0.0 - 30.0 mg/g 11.8 - - - 16.4  Chol 0 - 200 mg/dL - - - 122 -  HDL >39 mg/dL - - - 40 -  Calc LDL - - 94 - 65 -  Triglycerides <150 mg/dL - - - 84 -  Creatinine 0.50 - 1.10 mg/dL 0.56 - 0.57 0.57 -   BP/Weight 04/14/2015 01/03/2015 08/11/2014 04/07/2014 09/10/2013 01/10/3661 08/06/7653  Systolic BP 650 354 656 812 751 700 174  Diastolic BP 78 70 70 78 80 80 86  Wt. (Lbs) 193.04 190 191  189.08 191 187 192  BMI 33.12 32.6 32.29 31.97 32.29 31.61 32.46   Foot/eye exam completion dates Latest Ref Rng 01/24/2015 04/07/2014  Eye Exam No Retinopathy No Retinopathy -  Foot exam Order - - -  Foot Form Completion - - Done       Obesity Deteriorated. Patient re-educated about  the importance of commitment to a  minimum of 150 minutes of exercise per week.  The importance of healthy food choices with portion control discussed. Encouraged to start a food diary, count calories and to consider  joining a support group. Sample diet sheets offered. Goals  set by the patient for the next several months.   Weight /BMI 04/14/2015 01/03/2015 08/11/2014  WEIGHT 193 lb 0.6 oz 190 lb 191 lb  HEIGHT 5\' 4"  5\' 4"  -  BMI 33.12 kg/m2 32.6 kg/m2 32.29 kg/m2    Current exercise per week 90 minutes.   Malignant neoplasm of breast (female) Open discussion at visit, continues to refuse further testing or treatment  Hyperlipidemia Hyperlipidemia:Low fat diet discussed and encouraged. Controlled, no change in medication   Lipid Panel  Lab Results  Component Value Date   CHOL 122 04/14/2014   HDL 40 04/14/2014   LDLCALC 94 12/07/2014   TRIG 84 04/14/2014   CHOLHDL 3.1 04/14/2014      Updated lab LDL is 94 in 12/2014

## 2015-01-03 NOTE — Patient Instructions (Addendum)
Annual wellness May 10 or after, call if you need me before  Microalb, cmp and EGFR non fasting  , HBA1C, TSH May 7 or after  Cholesterol is good, blood sugar has improved  It is important that you exercise regularly at least 30 minutes 5 times a week. If you develop chest pain, have severe difficulty breathing, or feel very tired, stop exercising immediately and seek medical attention    Cut back on sugar, potatoes, rice , pasta and bread, so waist line, weight and blood sugar go down

## 2015-01-12 ENCOUNTER — Encounter: Payer: Self-pay | Admitting: Family Medicine

## 2015-01-24 DIAGNOSIS — H35413 Lattice degeneration of retina, bilateral: Secondary | ICD-10-CM | POA: Diagnosis not present

## 2015-01-24 DIAGNOSIS — E11359 Type 2 diabetes mellitus with proliferative diabetic retinopathy without macular edema: Secondary | ICD-10-CM | POA: Diagnosis not present

## 2015-01-24 DIAGNOSIS — E119 Type 2 diabetes mellitus without complications: Secondary | ICD-10-CM | POA: Diagnosis not present

## 2015-01-24 LAB — HM DIABETES EYE EXAM

## 2015-02-15 ENCOUNTER — Other Ambulatory Visit: Payer: Self-pay | Admitting: Family Medicine

## 2015-02-16 ENCOUNTER — Telehealth: Payer: Self-pay | Admitting: Family Medicine

## 2015-02-16 NOTE — Telephone Encounter (Signed)
metfromin has been sent in

## 2015-03-10 ENCOUNTER — Other Ambulatory Visit: Payer: Self-pay | Admitting: Family Medicine

## 2015-03-11 ENCOUNTER — Other Ambulatory Visit: Payer: Self-pay | Admitting: Family Medicine

## 2015-04-12 ENCOUNTER — Telehealth: Payer: Self-pay

## 2015-04-12 DIAGNOSIS — E785 Hyperlipidemia, unspecified: Secondary | ICD-10-CM

## 2015-04-12 DIAGNOSIS — IMO0001 Reserved for inherently not codable concepts without codable children: Secondary | ICD-10-CM

## 2015-04-12 NOTE — Telephone Encounter (Signed)
Labs re-ordered

## 2015-04-14 ENCOUNTER — Ambulatory Visit (INDEPENDENT_AMBULATORY_CARE_PROVIDER_SITE_OTHER): Payer: Commercial Managed Care - HMO | Admitting: Family Medicine

## 2015-04-14 ENCOUNTER — Encounter: Payer: Self-pay | Admitting: Family Medicine

## 2015-04-14 VITALS — BP 134/78 | HR 80 | Resp 18 | Ht 64.0 in | Wt 193.0 lb

## 2015-04-14 DIAGNOSIS — Z23 Encounter for immunization: Secondary | ICD-10-CM | POA: Insufficient documentation

## 2015-04-14 DIAGNOSIS — IMO0001 Reserved for inherently not codable concepts without codable children: Secondary | ICD-10-CM

## 2015-04-14 DIAGNOSIS — Z0000 Encounter for general adult medical examination without abnormal findings: Secondary | ICD-10-CM | POA: Diagnosis not present

## 2015-04-14 DIAGNOSIS — R809 Proteinuria, unspecified: Secondary | ICD-10-CM

## 2015-04-14 DIAGNOSIS — E119 Type 2 diabetes mellitus without complications: Secondary | ICD-10-CM

## 2015-04-14 DIAGNOSIS — I10 Essential (primary) hypertension: Secondary | ICD-10-CM | POA: Diagnosis not present

## 2015-04-14 DIAGNOSIS — E785 Hyperlipidemia, unspecified: Secondary | ICD-10-CM

## 2015-04-14 LAB — HEMOGLOBIN A1C: A1c: 7

## 2015-04-14 LAB — LDL CHOLESTEROL, DIRECT: LDL: 94

## 2015-04-14 NOTE — Progress Notes (Signed)
Subjective:    Patient ID: [name], female    DOB: May 11, 1956, 59 y.o.   MRN: [id number]  HPI Preventive Screening-Counseling & Management   Patient present here today for a Medicare annual wellness visit.   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 No  Over the past two weeks, have you felt little interest or pleasure in doing things? No  Have you lost interest or pleasure in daily life? No  Do you often feel hopeless? No  Do you cry easily over simple problems? No   Activities of Daily Living  In your present state of health, do you have any difficulty performing the following activities?  Driving?: No Managing money?: No Feeding yourself?:No Getting from bed to chair?:No Climbing a flight of stairs?: Yes due knee pain Preparing food and eating?:No Bathing or showering?:No Getting dressed?:No Getting to the toilet?:No Using the toilet?:No Moving around from place to place?: Yes due to knee pain  Fall Risk Assessment In the past year have you fallen or had a near fall?:No Are you currently taking any medications that make you dizzy?:No   Hearing Difficulties: No Do you often ask people to speak up or repeat themselves?:No Do you experience ringing or noises in your ears?:No Do you have difficulty understanding soft or whispered voices?:No  Cognitive Testing  Alert? Yes Normal Appearance?Yes  Oriented to person? Yes Place? Yes  Time?  Yes  Displays appropriate judgment?Yes  Can read the correct time from a watch face? yes Are you having problems remembering things?No  Advanced Directives have been discussed with the patient?Yes and brochure given   List the Names of Other Physician/Practitioners you currently use: updated   Indicate any recent Medical Services you may have received from other than [facility] providers in the past year (date may be approximate).   Assessment:    Annual Wellness Exam   Plan:    Medicare Attestation  I have personally reviewed:  The patient's medical and social history  Their use of alcohol, tobacco or illicit drugs  Their current medications and supplements  The patient's functional ability including ADLs,fall risks, home safety risks, cognitive, and hearing and visual impairment  Diet and physical activities  Evidence for depression or mood disorders  The patient's weight, height, BMI, and visual acuity have been recorded in the chart. I have made referrals, counseling, and provided education to the patient based on review of the above and I have provided the patient with a written personalized care plan for preventive services.      Review of Systems     Objective:   Physical Exam  BP 134/78 mmHg  Pulse 80  Resp 18  Ht 5\' 4"  (1.626 m)  Wt 193 lb 0.6 oz (87.562 kg)  BMI 33.12 kg/m2  SpO2 95%       Assessment & Plan:  .Medicare annual wellness visit, subsequent Annual exam as documented. Counseling done  re healthy lifestyle involving commitment to 150 minutes exercise per week, heart healthy diet, and attaining healthy weight.The importance of adequate sleep also discussed. Regular seat belt use and home safety, is also discussed. Changes in health habits are decided on by the patient with goals and time frames  set for achieving them. Immunization and cancer screening needs are specifically addressed at this visit.    Need for vaccination with 13-polyvalent  pneumococcal conjugate vaccine After obtaining informed consent, the vaccine is  administered by LPN.

## 2015-04-14 NOTE — Patient Instructions (Signed)
Annual physical exam in 4 month, call if you need me before  Non fasting lab today  Prevnar today  Fasting lipid, cmp and EGFR and hBA1C in 4 month,5 to 7 days before visit

## 2015-04-15 LAB — CBC WITH DIFFERENTIAL/PLATELET
Basophils Absolute: 0.1 10*3/uL (ref 0.0–0.1)
Basophils Relative: 1 % (ref 0–1)
EOS ABS: 0.5 10*3/uL (ref 0.0–0.7)
Eosinophils Relative: 5 % (ref 0–5)
HEMATOCRIT: 38.1 % (ref 36.0–46.0)
Hemoglobin: 12.2 g/dL (ref 12.0–15.0)
LYMPHS PCT: 31 % (ref 12–46)
Lymphs Abs: 3.3 10*3/uL (ref 0.7–4.0)
MCH: 26 pg (ref 26.0–34.0)
MCHC: 32 g/dL (ref 30.0–36.0)
MCV: 81.2 fL (ref 78.0–100.0)
MONO ABS: 0.8 10*3/uL (ref 0.1–1.0)
Monocytes Relative: 7 % (ref 3–12)
NEUTROS ABS: 6 10*3/uL (ref 1.7–7.7)
Neutrophils Relative %: 56 % (ref 43–77)
PLATELETS: 233 10*3/uL (ref 150–400)
RBC: 4.69 MIL/uL (ref 3.87–5.11)
RDW: 15.9 % — ABNORMAL HIGH (ref 11.5–15.5)
WBC: 10.8 10*3/uL — ABNORMAL HIGH (ref 4.0–10.5)

## 2015-04-15 LAB — COMPLETE METABOLIC PANEL WITH GFR
ALK PHOS: 76 U/L (ref 39–117)
ALT: 32 U/L (ref 0–35)
AST: 23 U/L (ref 0–37)
Albumin: 4 g/dL (ref 3.5–5.2)
BILIRUBIN TOTAL: 0.4 mg/dL (ref 0.2–1.2)
BUN: 11 mg/dL (ref 6–23)
CALCIUM: 9.4 mg/dL (ref 8.4–10.5)
CHLORIDE: 104 meq/L (ref 96–112)
CO2: 25 meq/L (ref 19–32)
CREATININE: 0.56 mg/dL (ref 0.50–1.10)
GFR, Est African American: >89 mL/min
GFR, Est Non African American: >89 mL/min
GLUCOSE: 131 mg/dL — AB (ref 70–99)
POTASSIUM: 3.6 meq/L (ref 3.5–5.3)
Sodium: 140 mEq/L (ref 135–145)
TOTAL PROTEIN: 7.1 g/dL (ref 6.0–8.3)

## 2015-04-15 LAB — MICROALBUMIN / CREATININE URINE RATIO
Creatinine, Urine: 245.5 mg/dL
MICROALB UR: 2.9 mg/dL — AB (ref <2.0)
MICROALB/CREAT RATIO: 11.8 mg/g (ref 0.0–30.0)

## 2015-04-15 LAB — HEMOGLOBIN A1C
HEMOGLOBIN A1C: 8.1 % — AB (ref <5.7)
MEAN PLASMA GLUCOSE: 186 mg/dL — AB (ref <117)

## 2015-04-15 LAB — TSH: TSH: 2.126 u[IU]/mL (ref 0.350–4.500)

## 2015-04-15 MED ORDER — METFORMIN HCL 1000 MG PO TABS: 1000.0000 mg | ORAL_TABLET | Freq: Two times a day (BID) | ORAL | Status: DC

## 2015-04-16 ENCOUNTER — Encounter: Payer: Self-pay | Admitting: Family Medicine

## 2015-04-16 NOTE — Assessment & Plan Note (Signed)

## 2015-04-16 NOTE — Assessment & Plan Note (Signed)
After obtaining informed consent, the vaccine is  administered by LPN.  

## 2015-05-16 NOTE — Assessment & Plan Note (Signed)
Open discussion at visit, continues to refuse further testing or treatment

## 2015-05-16 NOTE — Assessment & Plan Note (Signed)
" >>  ASSESSMENT AND PLAN FOR TYPE II DIABETES MELLITUS (HCC) WRITTEN ON 05/16/2015  7:44 AM BY Therisa Mennella E, MD  Controlled, no change in medication Patient educated about the importance of limiting  Carbohydrate intake , the need to commit to daily physical activity for a minimum of 30 minutes , and to commit weight loss.  Diabetic Labs Latest Ref Rng 04/14/2015 12/07/2014 08/11/2014 04/14/2014 04/07/2014  HbA1c <5.7 % 8.1(H) - 7.2(H) 7.0(H) -  Microalbumin <2.0 mg/dL 2.9(H) - - - 2.50(H)  Micro/Creat Ratio 0.0 - 30.0 mg/g 11.8 - - - 16.4  Chol 0 - 200 mg/dL - - - 877 -  HDL >60 mg/dL - - - 40 -  Calc LDL - - 94 - 65 -  Triglycerides <150 mg/dL - - - 84 -  Creatinine 0.50 - 1.10 mg/dL 9.43 - 9.42 9.42 -   BP/Weight 04/14/2015 01/03/2015 08/11/2014 04/07/2014 09/10/2013 06/09/2013 06/02/2013  Systolic BP 134 122 122 122 130 124 130  Diastolic BP 78 70 70 78 80 80 86  Wt. (Lbs) 193.04 190 191 189.08 191 187 192  BMI 33.12 32.6 32.29 31.97 32.29 31.61 32.46   Foot/eye exam completion dates Latest Ref Rng 01/24/2015 04/07/2014  Eye Exam No Retinopathy No Retinopathy -  Foot exam Order - - -  Foot Form Completion - - Done      "

## 2015-05-16 NOTE — Assessment & Plan Note (Signed)
Deteriorated. Patient re-educated about  the importance of commitment to a  minimum of 150 minutes of exercise per week.  The importance of healthy food choices with portion control discussed. Encouraged to start a food diary, count calories and to consider  joining a support group. Sample diet sheets offered. Goals set by the patient for the next several months.   Weight /BMI 04/14/2015 01/03/2015 08/11/2014  WEIGHT 193 lb 0.6 oz 190 lb 191 lb  HEIGHT 5\' 4"  5\' 4"  -  BMI 33.12 kg/m2 32.6 kg/m2 32.29 kg/m2    Current exercise per week 90 minutes.

## 2015-05-16 NOTE — Assessment & Plan Note (Signed)
Hyperlipidemia:Low fat diet discussed and encouraged. Controlled, no change in medication   Lipid Panel  Lab Results  Component Value Date   CHOL 122 04/14/2014   HDL 40 04/14/2014   LDLCALC 94 12/07/2014   TRIG 84 04/14/2014   CHOLHDL 3.1 04/14/2014      Updated lab LDL is 94 in 12/2014

## 2015-05-16 NOTE — Assessment & Plan Note (Signed)
Controlled, no change in medication DASH diet and commitment to daily physical activity for a minimum of 30 minutes discussed and encouraged, as a part of hypertension management. The importance of attaining a healthy weight is also discussed.  BP/Weight 04/14/2015 01/03/2015 08/11/2014 04/07/2014 09/10/2013 07/05/2548 07/04/6414  Systolic BP 830 940 768 088 110 315 945  Diastolic BP 78 70 70 78 80 80 86  Wt. (Lbs) 193.04 190 191 189.08 191 187 192  BMI 33.12 32.6 32.29 31.97 32.29 31.61 32.46

## 2015-05-16 NOTE — Assessment & Plan Note (Signed)
Controlled, no change in medication Patient educated about the importance of limiting  Carbohydrate intake , the need to commit to daily physical activity for a minimum of 30 minutes , and to commit weight loss.  Diabetic Labs Latest Ref Rng 04/14/2015 12/07/2014 08/11/2014 04/14/2014 04/07/2014  HbA1c <5.7 % 8.1(H) - 7.2(H) 7.0(H) -  Microalbumin <2.0 mg/dL 2.9(H) - - - 2.50(H)  Micro/Creat Ratio 0.0 - 30.0 mg/g 11.8 - - - 16.4  Chol 0 - 200 mg/dL - - - 122 -  HDL >39 mg/dL - - - 40 -  Calc LDL - - 94 - 65 -  Triglycerides <150 mg/dL - - - 84 -  Creatinine 0.50 - 1.10 mg/dL 0.56 - 0.57 0.57 -   BP/Weight 04/14/2015 01/03/2015 08/11/2014 04/07/2014 09/10/2013 01/12/5620 3/0/8657  Systolic BP 846 962 952 841 324 401 027  Diastolic BP 78 70 70 78 80 80 86  Wt. (Lbs) 193.04 190 191 189.08 191 187 192  BMI 33.12 32.6 32.29 31.97 32.29 31.61 32.46   Foot/eye exam completion dates Latest Ref Rng 01/24/2015 04/07/2014  Eye Exam No Retinopathy No Retinopathy -  Foot exam Order - - -  Foot Form Completion - - Done

## 2015-06-30 ENCOUNTER — Telehealth: Payer: Self-pay | Admitting: *Deleted

## 2015-06-30 NOTE — Telephone Encounter (Signed)
Pt called requesting to speak with courtney. Please advise

## 2015-07-05 ENCOUNTER — Telehealth: Payer: Self-pay

## 2015-07-05 DIAGNOSIS — Z114 Encounter for screening for human immunodeficiency virus [HIV]: Secondary | ICD-10-CM

## 2015-07-05 DIAGNOSIS — IMO0001 Reserved for inherently not codable concepts without codable children: Secondary | ICD-10-CM

## 2015-07-05 DIAGNOSIS — E785 Hyperlipidemia, unspecified: Secondary | ICD-10-CM

## 2015-07-05 NOTE — Telephone Encounter (Signed)
See next telephone message. 

## 2015-07-06 NOTE — Telephone Encounter (Signed)
Called and left message for patient to return call.  

## 2015-07-06 NOTE — Telephone Encounter (Signed)
Needs HBA1C fasting lipid cmp and EGFR and HIV 8/12 or after then I  Can answetr the question pls order if not done thanks

## 2015-07-07 NOTE — Telephone Encounter (Signed)
Spoke with patient and she is aware that labs will need to be drawn prior to medication being changed.  Lab order mailed with date to have drawn.

## 2015-07-07 NOTE — Addendum Note (Signed)
Addended by: Denman George B on: 07/07/2015 10:43 AM   Modules accepted: Orders

## 2015-07-11 ENCOUNTER — Telehealth: Payer: Self-pay

## 2015-07-11 DIAGNOSIS — S6992XA Unspecified injury of left wrist, hand and finger(s), initial encounter: Secondary | ICD-10-CM

## 2015-07-11 NOTE — Telephone Encounter (Signed)
Noted, thanks!

## 2015-07-12 ENCOUNTER — Ambulatory Visit (HOSPITAL_COMMUNITY)
Admission: RE | Admit: 2015-07-12 | Discharge: 2015-07-12 | Disposition: A | Discharge status: Home / Self Care | Payer: Commercial Managed Care - HMO | Source: Ambulatory Visit | Attending: Family Medicine | Admitting: Family Medicine

## 2015-07-12 DIAGNOSIS — M79645 Pain in left finger(s): Secondary | ICD-10-CM | POA: Diagnosis present

## 2015-07-12 DIAGNOSIS — W231XXA Caught, crushed, jammed, or pinched between stationary objects, initial encounter: Secondary | ICD-10-CM | POA: Diagnosis not present

## 2015-07-12 DIAGNOSIS — S62633A Displaced fracture of distal phalanx of left middle finger, initial encounter for closed fracture: Secondary | ICD-10-CM | POA: Insufficient documentation

## 2015-07-12 DIAGNOSIS — S6992XA Unspecified injury of left wrist, hand and finger(s), initial encounter: Secondary | ICD-10-CM

## 2015-07-14 ENCOUNTER — Telehealth: Payer: Self-pay

## 2015-07-14 DIAGNOSIS — S62632A Displaced fracture of distal phalanx of right middle finger, initial encounter for closed fracture: Secondary | ICD-10-CM

## 2015-07-14 NOTE — Telephone Encounter (Signed)
Referral entered  

## 2015-07-14 NOTE — Telephone Encounter (Signed)
-----   Message from [name], MD sent at 07/13/2015  2:34 PM EDT ----- pls let her know has minimally displaced fracture,   Refer to ortho of her choice , she will be recommended a finger splint and time for healing , no surgery BUT i  Believe she will prefer to see ortho about this , oK to refer I will signs

## 2015-07-18 DIAGNOSIS — E119 Type 2 diabetes mellitus without complications: Secondary | ICD-10-CM | POA: Diagnosis not present

## 2015-07-18 DIAGNOSIS — Z114 Encounter for screening for human immunodeficiency virus [HIV]: Secondary | ICD-10-CM | POA: Diagnosis not present

## 2015-07-18 DIAGNOSIS — E785 Hyperlipidemia, unspecified: Secondary | ICD-10-CM | POA: Diagnosis not present

## 2015-07-18 DIAGNOSIS — Z1159 Encounter for screening for other viral diseases: Secondary | ICD-10-CM | POA: Diagnosis not present

## 2015-07-18 DIAGNOSIS — R809 Proteinuria, unspecified: Secondary | ICD-10-CM | POA: Diagnosis not present

## 2015-07-19 LAB — LIPID PANEL
CHOLESTEROL: 111 mg/dL — AB (ref 125–200)
HDL: 36 mg/dL — ABNORMAL LOW (ref >=46)
LDL CALC: 61 mg/dL (ref <130)
Total CHOL/HDL Ratio: 3.1 Ratio (ref <=5.0)
Triglycerides: 71 mg/dL (ref <150)
VLDL: 14 mg/dL (ref <30)

## 2015-07-19 LAB — COMPLETE METABOLIC PANEL WITH GFR
ALT: 23 U/L (ref 6–29)
AST: 20 U/L (ref 10–35)
Albumin: 3.9 g/dL (ref 3.6–5.1)
Alkaline Phosphatase: 72 U/L (ref 33–130)
BUN: 15 mg/dL (ref 7–25)
CHLORIDE: 103 mmol/L (ref 98–110)
CO2: 26 mmol/L (ref 20–31)
Calcium: 9.2 mg/dL (ref 8.6–10.4)
Creat: 0.6 mg/dL (ref 0.50–0.99)
GFR, Est African American: >89 mL/min (ref >=60)
GFR, Est Non African American: >89 mL/min (ref >=60)
Glucose, Bld: 131 mg/dL — ABNORMAL HIGH (ref 65–99)
POTASSIUM: 4 mmol/L (ref 3.5–5.3)
Sodium: 141 mmol/L (ref 135–146)
Total Bilirubin: 0.4 mg/dL (ref 0.2–1.2)
Total Protein: 7 g/dL (ref 6.1–8.1)

## 2015-07-19 LAB — HEMOGLOBIN A1C
HEMOGLOBIN A1C: 7.9 % — AB (ref <5.7)
MEAN PLASMA GLUCOSE: 180 mg/dL — AB (ref <117)

## 2015-07-19 LAB — HIV ANTIBODY (ROUTINE TESTING W REFLEX): HIV 1&2 Ab, 4th Generation: NONREACTIVE

## 2015-07-21 ENCOUNTER — Ambulatory Visit: Payer: Commercial Managed Care - HMO | Admitting: Orthopedic Surgery

## 2015-07-28 ENCOUNTER — Ambulatory Visit: Payer: Commercial Managed Care - HMO | Admitting: Orthopedic Surgery

## 2015-08-01 ENCOUNTER — Ambulatory Visit (INDEPENDENT_AMBULATORY_CARE_PROVIDER_SITE_OTHER): Payer: Commercial Managed Care - HMO | Admitting: Orthopedic Surgery

## 2015-08-01 VITALS — BP 132/80 | Ht 64.0 in | Wt 193.0 lb

## 2015-08-01 DIAGNOSIS — S62639A Displaced fracture of distal phalanx of unspecified finger, initial encounter for closed fracture: Secondary | ICD-10-CM | POA: Diagnosis not present

## 2015-08-01 MED ORDER — IBUPROFEN 800 MG PO TABS: 800.0000 mg | ORAL_TABLET | Freq: Three times a day (TID) | ORAL | Status: DC | PRN

## 2015-08-01 NOTE — Progress Notes (Signed)
Patient ID: [name], female   DOB: [birth year], 59 y.o.   MRN: [id number]  New problem visit  Chief Complaint  Patient presents with  . Hand Pain    ER follow up on fracture of left middle finger. DOI 07-09-15.    Ht 5\' 4"  (1.626 m)  Wt 193 lb (87.544 kg)  BMI 33.11 kg/m2  No diagnosis found.  Chief Complaint  Patient presents with  . Hand Pain    ER follow up on fracture of left middle finger. DOI 07-09-15.    The patient had a car door close in her left long finger she is right-hand dominant she sustained a distal phalanx fracture on August 6 she comes in still in a splint taking ibuprofen for pain complaining of pain over the fracture site. She rates her pain 9 out of 10 she has some swelling fortunately she has not lost any range of motion she does complain of some bilateral foot pain which is chronic back pain chronic burning pain in her legs and some bilateral bowel function is normal bladder function    Past Medical History  Diagnosis Date  . Allergic rhinitis   . Anemia, deficiency   . GERD (gastroesophageal reflux disease)   . Obesity   . Diabetes mellitus   . Hypertension   . Hyperlipidemia     BP 132/80 mmHg  Ht 5\' 4"  (1.626 m)  Wt 193 lb (87.544 kg)  BMI 33.11 kg/m2 She is awake alert and oriented 3 mood and affect are normal gait and station are normal general appearance is normal  Her finger has a transverse laceration beneath the nail about two thirds from the base of the nail she fortunately has retained all range of motion. Stability is not an issue motor exam is normal for both extensor and flexor tendon scans intact sensation normal pulse and vascularity and capillary refill normal  Impression distal phalanx fracture left long finger  Recommend remove splint active range of motion follow-up in 3 weeks

## 2015-08-02 ENCOUNTER — Ambulatory Visit (INDEPENDENT_AMBULATORY_CARE_PROVIDER_SITE_OTHER): Payer: Commercial Managed Care - HMO

## 2015-08-02 DIAGNOSIS — Z23 Encounter for immunization: Secondary | ICD-10-CM

## 2015-08-15 ENCOUNTER — Other Ambulatory Visit (HOSPITAL_COMMUNITY)
Admission: RE | Admit: 2015-08-15 | Discharge: 2015-08-15 | Disposition: A | Discharge status: Home / Self Care | Payer: Commercial Managed Care - HMO | Source: Ambulatory Visit | Attending: Family Medicine | Admitting: Family Medicine

## 2015-08-15 ENCOUNTER — Ambulatory Visit (INDEPENDENT_AMBULATORY_CARE_PROVIDER_SITE_OTHER): Payer: Commercial Managed Care - HMO | Admitting: Family Medicine

## 2015-08-15 ENCOUNTER — Encounter: Payer: Self-pay | Admitting: Family Medicine

## 2015-08-15 VITALS — BP 114/80 | HR 79 | Resp 16 | Ht 64.0 in | Wt 192.0 lb

## 2015-08-15 DIAGNOSIS — Z91199 Patient's noncompliance with other medical treatment and regimen due to unspecified reason: Secondary | ICD-10-CM | POA: Insufficient documentation

## 2015-08-15 DIAGNOSIS — E119 Type 2 diabetes mellitus without complications: Secondary | ICD-10-CM | POA: Diagnosis not present

## 2015-08-15 DIAGNOSIS — Z124 Encounter for screening for malignant neoplasm of cervix: Secondary | ICD-10-CM | POA: Insufficient documentation

## 2015-08-15 DIAGNOSIS — Z0000 Encounter for general adult medical examination without abnormal findings: Secondary | ICD-10-CM

## 2015-08-15 DIAGNOSIS — N3001 Acute cystitis with hematuria: Secondary | ICD-10-CM | POA: Insufficient documentation

## 2015-08-15 DIAGNOSIS — I10 Essential (primary) hypertension: Secondary | ICD-10-CM | POA: Diagnosis not present

## 2015-08-15 DIAGNOSIS — IMO0001 Reserved for inherently not codable concepts without codable children: Secondary | ICD-10-CM

## 2015-08-15 DIAGNOSIS — R809 Proteinuria, unspecified: Secondary | ICD-10-CM

## 2015-08-15 DIAGNOSIS — N950 Postmenopausal bleeding: Secondary | ICD-10-CM

## 2015-08-15 DIAGNOSIS — Z1211 Encounter for screening for malignant neoplasm of colon: Secondary | ICD-10-CM | POA: Diagnosis not present

## 2015-08-15 DIAGNOSIS — N3000 Acute cystitis without hematuria: Secondary | ICD-10-CM | POA: Diagnosis not present

## 2015-08-15 DIAGNOSIS — Z1159 Encounter for screening for other viral diseases: Secondary | ICD-10-CM

## 2015-08-15 DIAGNOSIS — Z9119 Patient's noncompliance with other medical treatment and regimen: Secondary | ICD-10-CM

## 2015-08-15 HISTORY — DX: Postmenopausal bleeding: N95.0

## 2015-08-15 HISTORY — DX: Encounter for general adult medical examination without abnormal findings: Z00.00

## 2015-08-15 LAB — POCT URINALYSIS DIPSTICK
BILIRUBIN UA: NEGATIVE
GLUCOSE UA: NEGATIVE
Ketones, UA: NEGATIVE
Nitrite, UA: NEGATIVE
SPEC GRAV UA: 1.025
UROBILINOGEN UA: 0.2
pH, UA: 5.5

## 2015-08-15 LAB — POC HEMOCCULT BLD/STL (OFFICE/1-CARD/DIAGNOSTIC): FECAL OCCULT BLD: NEGATIVE

## 2015-08-15 NOTE — Assessment & Plan Note (Signed)

## 2015-08-15 NOTE — Assessment & Plan Note (Signed)
Symptomatic x 1 week, no fever or chills, abnormal UA will follow c/s

## 2015-08-15 NOTE — Assessment & Plan Note (Signed)
Pt continues to refuse treatment for established breast cancer, discussed openly at thsi visit once again, she refuses

## 2015-08-15 NOTE — Progress Notes (Signed)
   Subjective:    Patient ID: [name], female    DOB: [birth year], 59 y.o.   MRN: [id number]  HPI Patient is in for annual physical exam. 1 week h/o malodorous urine, no fever or chills. 1 month h/o intermittent PMB with "straining" Continues to refuse definitive management of established breast disease Immunization is reviewed , and  updated if needed. Needs diabetic test supplies, also needs annual foot exam   Review of Systems See HPI     Objective:   Physical Exam BP 114/80 mmHg  Pulse 79  Resp 16  Ht 5\' 4"  (1.626 m)  Wt 192 lb (87.091 kg)  BMI 32.94 kg/m2  SpO2 96% Pleasant well nourished female, alert and oriented x 3, in no cardio-pulmonary distress. Afebrile. HEENT No facial trauma or asymetry. Sinuses non tender.  Extra occullar muscles intact,. External ears normal, tympanic membranes clear. Oropharynx moist, no exudate, fair dentition. Neck: supple, no adenopathy,JVD or thyromegaly.No bruits.  Chest: Clear to ascultation bilaterally.No crackles or wheezes. Non tender to palpation  Breast: No asymetry,no masses or lumps. No tenderness. No nipple discharge or inversion. No axillary or supraclavicular adenopathy  Cardiovascular system; Heart sounds normal,  S1 and  S2 ,no S3.  No murmur, or thrill. Apical beat not displaced Peripheral pulses normal.  Abdomen: Soft, non tender, no organomegaly or masses. No bruits. Bowel sounds normal. No guarding, tenderness or rebound.  Rectal:  Normal sphincter tone. No mass.No rectal masses.  Guaiac negative stool.  GU: External genitalia normal female genitalia , female distribution of hair. No lesions. Urethral meatus normal in size, no  Prolapse, no lesions visibly  Present. Bladder non tender. Vagina pink and moist , with no visible lesions , physiologic discharge present . Adequate pelvic support no  cystocele or rectocele noted Cervix pink and appears healthy, no lesions or ulcerations noted, no  discharge noted from os. Bleeding from cervix on exam Uterus slightly enlarged, no adnexal masses, no cervical motion or adnexal tenderness.   Musculoskeletal exam: Full ROM of spine, hips , shoulders and knees. No deformity ,swelling or crepitus noted. No muscle wasting or atrophy.   Neurologic: Cranial nerves 2 to 12 intact. Power, tone ,sensation and reflexes normal throughout. No disturbance in gait. No tremor.  Skin: Intact,  scaling  rash noted on soles Pigmentation normal throughout  Psych; Normal mood and affect. Judgement and concentration normal        Assessment & Plan:  Annual physical exam Annual exam as documented. Counseling done  re healthy lifestyle involving commitment to 150 minutes exercise per week, heart healthy diet, and attaining healthy weight.The importance of adequate sleep also discussed. Regular seat belt use and home safety, is also discussed. Changes in health habits are decided on by the patient with goals and time frames  set for achieving them. Immunization and cancer screening needs are specifically addressed at this visit.   Acute cystitis with hematuria Symptomatic x 1 week, no fever or chills, abnormal UA will follow c/s  Post-menopausal bleeding 1 month h/o intermittent vaginal spotting/ bleeding, needs imaging  Non compliance with medical treatment Pt continues to refuse treatment for established breast cancer, discussed openly at thsi visit once again, she refuses

## 2015-08-15 NOTE — Assessment & Plan Note (Signed)
1 month h/o intermittent vaginal spotting/ bleeding, needs imaging

## 2015-08-15 NOTE — Patient Instructions (Addendum)
F/u in January  UA today  You are referred for pelvic US due to spotting x 1 monh  Script for meter and once daily testing  Non fast labs in January  Please work on good  health habits so that your health will improve. 1. Commitment to daily physical activity for 30 to 60  minutes, if you are able to do this.  2. Commitment to wise food choices. Aim for half of your  food intake to be vegetable and fruit, one quarter starchy foods, and one quarter protein. Try to eat on a regular schedule  3 meals per day, snacking between meals should be limited to vegetables or fruits or small portions of nuts. 64 ounces of water per day is generally recommended, unless you have specific health conditions, like heart failure or kidney failure where you will need to limit fluid intake.  3. Commitment to sufficient and a  good quality of physical and mental rest daily, generally between 6 to 8 hours per day.  WITH PERSISTANCE AND PERSEVERANCE, THE IMPOSSIBLE , BECOMES THE NORM!  Thanks for choosing [facility], we consider it a privelige to serve you. Check your pharmacy for shingles vaccine you need this

## 2015-08-17 LAB — CYTOLOGY - PAP

## 2015-08-19 ENCOUNTER — Other Ambulatory Visit (HOSPITAL_COMMUNITY): Payer: Self-pay

## 2015-08-19 ENCOUNTER — Other Ambulatory Visit: Payer: Self-pay | Admitting: Family Medicine

## 2015-08-19 DIAGNOSIS — N950 Postmenopausal bleeding: Secondary | ICD-10-CM

## 2015-08-19 LAB — URINE CULTURE

## 2015-08-19 MED ORDER — CIPROFLOXACIN HCL 500 MG PO TABS: 500.0000 mg | ORAL_TABLET | Freq: Two times a day (BID) | ORAL | Status: DC

## 2015-08-22 ENCOUNTER — Encounter: Payer: Self-pay | Admitting: Orthopedic Surgery

## 2015-08-22 ENCOUNTER — Ambulatory Visit (INDEPENDENT_AMBULATORY_CARE_PROVIDER_SITE_OTHER): Payer: Self-pay | Admitting: Orthopedic Surgery

## 2015-08-22 VITALS — BP 122/74 | Ht 64.0 in | Wt 192.0 lb

## 2015-08-22 DIAGNOSIS — S62639A Displaced fracture of distal phalanx of unspecified finger, initial encounter for closed fracture: Secondary | ICD-10-CM

## 2015-08-22 NOTE — Progress Notes (Signed)
Patient ID: [name], female   DOB: 23-May-1956, 59 y.o.   MRN: [id number]  Follow up visit  Chief Complaint  Patient presents with  . Follow-up    3 week recheck on left middle finger fracture, exam only. DOI 07-09-15.    BP 122/74 mmHg  Ht 5\' 4"  (1.626 m)  Wt 192 lb (87.091 kg)  BMI 32.94 kg/m2  Encounter Diagnosis  Name Primary?  . Distal phalanx or phalanges, closed fracture, initial encounter Yes    The patient has no complaints related to the finger other than some mild discomfort on the radial side. She's regained full range of motion no tenderness nail was healing well  Patient is released from care

## 2015-08-25 ENCOUNTER — Ambulatory Visit (HOSPITAL_COMMUNITY)
Admission: RE | Admit: 2015-08-25 | Discharge: 2015-08-25 | Disposition: A | Discharge status: Home / Self Care | Payer: Commercial Managed Care - HMO | Source: Ambulatory Visit | Attending: Family Medicine | Admitting: Family Medicine

## 2015-08-25 DIAGNOSIS — D259 Leiomyoma of uterus, unspecified: Secondary | ICD-10-CM | POA: Diagnosis not present

## 2015-08-25 DIAGNOSIS — N950 Postmenopausal bleeding: Secondary | ICD-10-CM

## 2015-08-30 ENCOUNTER — Other Ambulatory Visit: Payer: Self-pay

## 2015-08-30 DIAGNOSIS — N950 Postmenopausal bleeding: Secondary | ICD-10-CM

## 2015-08-31 ENCOUNTER — Other Ambulatory Visit: Payer: Self-pay | Admitting: Family Medicine

## 2015-08-31 ENCOUNTER — Telehealth: Payer: Self-pay | Admitting: *Deleted

## 2015-08-31 NOTE — Telephone Encounter (Signed)
Patient is scheduled for [facility] 10/10/15 at 1 PM per her request.

## 2015-09-20 ENCOUNTER — Telehealth: Payer: Self-pay

## 2015-09-20 DIAGNOSIS — IMO0001 Reserved for inherently not codable concepts without codable children: Secondary | ICD-10-CM

## 2015-09-20 NOTE — Telephone Encounter (Signed)
States she has been taking the metformin 500 still because she had a lot of it but when she increased to the metformin 1000 mg that it caused her to have a lot of reflux and tightness in her chest. She wants to go back to the 500mg  dose because she didn't have any problems with that dose. Please advise

## 2015-09-20 NOTE — Telephone Encounter (Signed)
m needs to add glipizide ER 2.5 mg one daily, follow diabetic diet if on metformin 500mg  once daily, needs HBA1C 3  Months from last done which is in [facility]

## 2015-09-21 ENCOUNTER — Other Ambulatory Visit: Payer: Self-pay

## 2015-09-21 MED ORDER — PRAVASTATIN SODIUM 20 MG PO TABS: 20.0000 mg | ORAL_TABLET | Freq: Every day | ORAL | Status: DC

## 2015-09-21 MED ORDER — METFORMIN HCL 500 MG PO TABS: 500.0000 mg | ORAL_TABLET | Freq: Every day | ORAL | Status: DC

## 2015-09-21 MED ORDER — GLIPIZIDE ER 2.5 MG PO TB24: 2.5000 mg | ORAL_TABLET | Freq: Every day | ORAL | Status: DC

## 2015-09-21 NOTE — Addendum Note (Signed)
Addended by: Eual Fines on: 09/21/2015 03:36 PM   Modules accepted: Orders

## 2015-09-21 NOTE — Telephone Encounter (Signed)
Pt aware and lab mailed and meds sent

## 2015-10-26 DIAGNOSIS — R809 Proteinuria, unspecified: Secondary | ICD-10-CM | POA: Diagnosis not present

## 2015-10-26 DIAGNOSIS — I10 Essential (primary) hypertension: Secondary | ICD-10-CM | POA: Diagnosis not present

## 2015-10-26 DIAGNOSIS — E119 Type 2 diabetes mellitus without complications: Secondary | ICD-10-CM | POA: Diagnosis not present

## 2015-10-26 DIAGNOSIS — Z1159 Encounter for screening for other viral diseases: Secondary | ICD-10-CM | POA: Diagnosis not present

## 2015-10-26 LAB — CBC
HCT: 37.3 % (ref 36.0–46.0)
Hemoglobin: 12.4 g/dL (ref 12.0–15.0)
MCH: 26.4 pg (ref 26.0–34.0)
MCHC: 33.2 g/dL (ref 30.0–36.0)
MCV: 79.4 fL (ref 78.0–100.0)
PLATELETS: 241 10*3/uL (ref 150–400)
RBC: 4.7 MIL/uL (ref 3.87–5.11)
RDW: 15.6 % — AB (ref 11.5–15.5)
WBC: 8.4 10*3/uL (ref 4.0–10.5)

## 2015-10-26 LAB — HEMOGLOBIN A1C
Hgb A1c MFr Bld: 7.8 % — ABNORMAL HIGH (ref <5.7)
MEAN PLASMA GLUCOSE: 177 mg/dL — AB (ref <117)

## 2015-10-27 LAB — COMPLETE METABOLIC PANEL WITH GFR
ALBUMIN: 4.2 g/dL (ref 3.6–5.1)
ALK PHOS: 90 U/L (ref 33–130)
ALT: 30 U/L — AB (ref 6–29)
AST: 25 U/L (ref 10–35)
BILIRUBIN TOTAL: 0.5 mg/dL (ref 0.2–1.2)
BUN: 10 mg/dL (ref 7–25)
CO2: 27 mmol/L (ref 20–31)
CREATININE: 0.61 mg/dL (ref 0.50–0.99)
Calcium: 9.3 mg/dL (ref 8.6–10.4)
Chloride: 103 mmol/L (ref 98–110)
GFR, Est Non African American: >89 mL/min (ref >=60)
GLUCOSE: 129 mg/dL — AB (ref 65–99)
Potassium: 4 mmol/L (ref 3.5–5.3)
Sodium: 138 mmol/L (ref 135–146)
TOTAL PROTEIN: 7.3 g/dL (ref 6.1–8.1)

## 2015-10-27 LAB — HEPATITIS C ANTIBODY: HCV Ab: NEGATIVE

## 2015-11-05 ENCOUNTER — Other Ambulatory Visit: Payer: Self-pay | Admitting: Family Medicine

## 2015-12-22 ENCOUNTER — Ambulatory Visit (INDEPENDENT_AMBULATORY_CARE_PROVIDER_SITE_OTHER): Payer: Commercial Managed Care - HMO | Admitting: Family Medicine

## 2015-12-22 VITALS — BP 128/78 | HR 71 | Resp 16 | Ht 64.0 in | Wt 188.0 lb

## 2015-12-22 DIAGNOSIS — E669 Obesity, unspecified: Secondary | ICD-10-CM | POA: Diagnosis not present

## 2015-12-22 DIAGNOSIS — I10 Essential (primary) hypertension: Secondary | ICD-10-CM

## 2015-12-22 DIAGNOSIS — IMO0001 Reserved for inherently not codable concepts without codable children: Secondary | ICD-10-CM

## 2015-12-22 DIAGNOSIS — E119 Type 2 diabetes mellitus without complications: Secondary | ICD-10-CM

## 2015-12-22 DIAGNOSIS — Z91199 Patient's noncompliance with other medical treatment and regimen due to unspecified reason: Secondary | ICD-10-CM

## 2015-12-22 DIAGNOSIS — Z9119 Patient's noncompliance with other medical treatment and regimen: Secondary | ICD-10-CM

## 2015-12-22 DIAGNOSIS — E785 Hyperlipidemia, unspecified: Secondary | ICD-10-CM | POA: Diagnosis not present

## 2015-12-22 DIAGNOSIS — R809 Proteinuria, unspecified: Secondary | ICD-10-CM

## 2015-12-22 NOTE — Patient Instructions (Addendum)
Annual wellness May 13 or after, call if you need me before  Fasting lipid, cmp and eGFR, hBA1C end March, 2017  Please work on good  health habits so that your health will improve. 1. Commitment to daily physical activity for 30 to 60  minutes, if you are able to do this.  2. Commitment to wise food choices. Aim for half of your  food intake to be vegetable and fruit, one quarter starchy foods, and one quarter protein. Try to eat on a regular schedule  3 meals per day, snacking between meals should be limited to vegetables or fruits or small portions of nuts. 64 ounces of water per day is generally recommended, unless you have specific health conditions, like heart failure or kidney failure where you will need to limit fluid intake.  3. Commitment to sufficient and a  good quality of physical and mental rest daily, generally between 6 to 8 hours per day.  WITH PERSISTANCE AND PERSEVERANCE, THE IMPOSSIBLE , BECOMES THE NORM!

## 2015-12-22 NOTE — Progress Notes (Signed)
Subjective:    Patient ID: [name], female    DOB: 03-12-1956, 60 y.o.   MRN: [id number]  HPI   [name]     MRN: [id number]      DOB: 22-Feb-1956   HPI [name] is here for follow up and re-evaluation of chronic medical conditions, medication management and review of any available recent lab and radiology data.  Preventive health is updated, specifically  Cancer screening and Immunization.   Questions or concerns regarding consultations or procedures which the PT has had in the interim are  addressed. The PT denies any adverse reactions to current medications since the last visit.  There are no new concerns.  There are no specific complaints   ROS Denies recent fever or chills. Denies sinus pressure, nasal congestion, ear pain or sore throat. Denies chest congestion, productive cough or wheezing. Denies chest pains, palpitations and leg swelling Denies abdominal pain, nausea, vomiting,diarrhea or constipation.   Denies dysuria, frequency, hesitancy or incontinence. Denies joint pain, swelling and limitation in mobility. Denies headaches, seizures, numbness, or tingling. Denies depression, anxiety or insomnia. Denies skin break down or rash.   PE  BP 128/78 mmHg  Pulse 71  Resp 16  Ht 5\' 4"  (1.626 m)  Wt 188 lb (85.276 kg)  BMI 32.25 kg/m2  SpO2 97%  Patient alert and oriented and in no cardiopulmonary distress.  HEENT: No facial asymmetry, EOMI,   oropharynx pink and moist.  Neck supple no JVD, no mass.  Chest: Clear to auscultation bilaterally.  CVS: S1, S2 no murmurs, no S3.Regular rate.  ABD: Soft non tender.   Ext: No edema  MS: Adequate ROM spine, shoulders, hips and knees.  Skin: Intact, no ulcerations or rash noted.  Psych: Good eye contact, normal affect. Memory intact not anxious or depressed appearing.  CNS: CN 2-12 intact, power,  normal throughout.no focal deficits noted.   Assessment & Plan   Essential  hypertension Controlled, no change in medication DASH diet and commitment to daily physical activity for a minimum of 30 minutes discussed and encouraged, as a part of hypertension management. The importance of attaining a healthy weight is also discussed.  BP/Weight 12/22/2015 08/22/2015 08/15/2015 08/01/2015 04/14/2015 [id number]  Systolic BP 0000000 123XX123 [id number] Q000111Q Q000111Q 123XX123 [id number]  Diastolic BP 78 74 80 80 78 70 70  Wt. 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 Riggan is reminded of the importance of commitment to daily physical activity for 30 minutes or more, as able and the need to limit carbohydrate intake to 30 to 60 grams per meal to help with blood sugar control.   The need to take medication as prescribed, test blood sugar as directed, and to call between visits if there is a concern that blood sugar is uncontrolled is also discussed.   Ms. Tavella is reminded of the importance of daily foot exam, annual eye examination, and good blood sugar, blood pressure and cholesterol control. Updated lab needed at/ before next visit.    Diabetic Labs Latest Ref Rng 10/26/2015 07/18/2015 04/14/2015 12/07/2014 08/11/2014  HbA1c <5.7 % 7.8(H) 7.9(H) 8.1(H) - 7.2(H)  Microalbumin <2.0 mg/dL - - 2.9(H) - -  Micro/Creat Ratio 0.0 - 30.0 mg/g - - 11.8 - -  Chol 125 - 200 mg/dL - 111(L) - - -  HDL >=46 mg/dL -  36(L) - - -  Calc LDL <130 mg/dL - 61 - 94 -  Triglycerides <150 mg/dL - 71 - - -  Creatinine 0.50 - 0.99 mg/dL 0.61 0.60 0.56 - 0.57   BP/Weight 12/22/2015 08/22/2015 08/15/2015 08/01/2015 04/14/2015 [id number]  Systolic BP 0000000 123XX123 [id number] Q000111Q Q000111Q 123XX123 [id number]  Diastolic BP 78 74 80 80 78 70 70  Wt. (Lbs) 188 192 192 193 193.04 190 191  BMI 32.25 32.94 32.94 33.11 33.12 32.6 32.29   Foot/eye exam completion dates Latest Ref Rng 08/15/2015 01/24/2015  Eye Exam No Retinopathy - No Retinopathy   Foot exam Order - - -  Foot Form Completion - Done -         Hyperlipidemia Hyperlipidemia:Low fat diet discussed and encouraged.   Lipid Panel  Lab Results  Component Value Date   CHOL 111* 07/18/2015   HDL 36* 07/18/2015   LDLCALC 61 07/18/2015   TRIG 71 07/18/2015   CHOLHDL 3.1 07/18/2015   Needs to commit to regular physical activity     Obesity Improved Patient re-educated about  the importance of commitment to a  minimum of 150 minutes of exercise per week.  The importance of healthy food choices with portion control discussed. Encouraged to start a food diary, count calories and to consider  joining a support group. Sample diet sheets offered. Goals set by the patient for the next several months.   Weight /BMI 12/22/2015 08/22/2015 08/15/2015  WEIGHT 188 lb 192 lb 192 lb  HEIGHT 5\' 4"  5\' 4"  5\' 4"   BMI 32.25 kg/m2 32.94 kg/m2 32.94 kg/m2    Current exercise per week 90 minutes.   Non compliance with medical treatment Still in denial re need for re imaging and biopsy for brast cancer, no mammogram ordered in past 3 years, she has repeatedly refused biopsy       Review of Systems     Objective:   Physical Exam        Assessment & Plan:

## 2016-01-14 ENCOUNTER — Encounter: Payer: Self-pay | Admitting: Family Medicine

## 2016-01-14 NOTE — Assessment & Plan Note (Signed)
Improved Patient re-educated about  the importance of commitment to a  minimum of 150 minutes of exercise per week.  The importance of healthy food choices with portion control discussed. Encouraged to start a food diary, count calories and to consider  joining a support group. Sample diet sheets offered. Goals set by the patient for the next several months.   Weight /BMI 12/22/2015 08/22/2015 08/15/2015  WEIGHT 188 lb 192 lb 192 lb  HEIGHT 5\' 4"  5\' 4"  5\' 4"   BMI 32.25 kg/m2 32.94 kg/m2 32.94 kg/m2    Current exercise per week 90 minutes.

## 2016-01-14 NOTE — Assessment & Plan Note (Signed)
Still in denial re need for re imaging and biopsy for brast cancer, no mammogram ordered in past 3 years, she has repeatedly refused biopsy

## 2016-01-14 NOTE — Assessment & Plan Note (Signed)
Hyperlipidemia:Low fat diet discussed and encouraged.   Lipid Panel  Lab Results  Component Value Date   CHOL 111* 07/18/2015   HDL 36* 07/18/2015   LDLCALC 61 07/18/2015   TRIG 71 07/18/2015   CHOLHDL 3.1 07/18/2015   Needs to commit to regular physical activity

## 2016-01-14 NOTE — Assessment & Plan Note (Signed)
" >>  ASSESSMENT AND PLAN FOR TYPE II DIABETES MELLITUS ([facility]) WRITTEN ON 01/14/2016  6:50 PM BY Angee Gupton E, MD  [name] is reminded of the importance of commitment to daily physical activity for 30 minutes or more, as able and the need to limit carbohydrate intake to 30 to 60 grams per meal to help with blood sugar control.   The need to take medication as prescribed, test blood sugar as directed, and to call between visits if there is a concern that blood sugar is uncontrolled is also discussed.   Ms. Dehne is reminded of the importance of daily foot exam, annual eye examination, and good blood sugar, blood pressure and cholesterol control. Updated lab needed at/ before next visit.    Diabetic Labs Latest Ref Rng 10/26/2015 07/18/2015 04/14/2015 12/07/2014 08/11/2014  HbA1c <5.7 % 7.8(H) 7.9(H) 8.1(H) - 7.2(H)  Microalbumin <2.0 mg/dL - - 2.9(H) - -  Micro/Creat Ratio 0.0 - 30.0 mg/g - - 11.8 - -  Chol 125 - 200 mg/dL - 888(O) - - -  HDL >=53 mg/dL - 63(O) - - -  Calc LDL <130 mg/dL - 61 - 94 -  Triglycerides <150 mg/dL - 71 - - -  Creatinine 0.50 - 0.99 mg/dL 9.38 9.39 9.43 - 9.42   BP/Weight 12/22/2015 08/22/2015 08/15/2015 08/01/2015 04/14/2015 01/03/2015 08/11/2014  Systolic BP 128 122 114 132 134 122 122  Diastolic BP 78 74 80 80 78 70 70  Wt. (Lbs) 188 192 192 193 193.04 190 191  BMI 32.25 32.94 32.94 33.11 33.12 32.6 32.29   Foot/eye exam completion dates Latest Ref Rng 08/15/2015 01/24/2015  Eye Exam No Retinopathy - No Retinopathy  Foot exam Order - - -  Foot Form Completion - Done -        "

## 2016-01-14 NOTE — Assessment & Plan Note (Signed)
Controlled, no change in medication DASH diet and commitment to daily physical activity for a minimum of 30 minutes discussed and encouraged, as a part of hypertension management. The importance of attaining a healthy weight is also discussed.  BP/Weight 12/22/2015 08/22/2015 08/15/2015 08/01/2015 04/14/2015 [id number]  Systolic BP 0000000 123XX123 [id number] Q000111Q Q000111Q [id number] [id number]  Diastolic BP 78 74 80 80 78 70 70  Wt. (Lbs) 188 192 192 193 193.04 190 191  BMI 32.25 32.94 32.94 33.11 33.12 32.6 32.29

## 2016-01-14 NOTE — Assessment & Plan Note (Signed)
[name] is reminded of the importance of commitment to daily physical activity for 30 minutes or more, as able and the need to limit carbohydrate intake to 30 to 60 grams per meal to help with blood sugar control.   The need to take medication as prescribed, test blood sugar as directed, and to call between visits if there is a concern that blood sugar is uncontrolled is also discussed.   [name] is reminded of the importance of daily foot exam, annual eye examination, and good blood sugar, blood pressure and cholesterol control. Updated lab needed at/ before next visit.    Diabetic Labs Latest Ref Rng 10/26/2015 07/18/2015 04/14/2015 12/07/2014 08/11/2014  HbA1c <5.7 % 7.8(H) 7.9(H) 8.1(H) - 7.2(H)  Microalbumin <2.0 mg/dL - - 2.9(H) - -  Micro/Creat Ratio 0.0 - 30.0 mg/g - - 11.8 - -  Chol 125 - 200 mg/dL - 111(L) - - -  HDL >=46 mg/dL - 36(L) - - -  Calc LDL <130 mg/dL - 61 - 94 -  Triglycerides <150 mg/dL - 71 - - -  Creatinine 0.50 - 0.99 mg/dL 0.61 0.60 0.56 - 0.57   BP/Weight 12/22/2015 08/22/2015 08/15/2015 08/01/2015 04/14/2015 [id number]  Systolic BP 0000000 123XX123 [id number] Q000111Q Q000111Q 123XX123 [id number]  Diastolic BP 78 74 80 80 78 70 70  Wt. (Lbs) 188 192 192 193 193.04 190 191  BMI 32.25 32.94 32.94 33.11 33.12 32.6 32.29   Foot/eye exam completion dates Latest Ref Rng 08/15/2015 01/24/2015  Eye Exam No Retinopathy - No Retinopathy  Foot exam Order - - -  Foot Form Completion - Done -

## 2016-02-07 ENCOUNTER — Other Ambulatory Visit: Payer: Self-pay | Admitting: Family Medicine

## 2016-05-02 ENCOUNTER — Encounter: Payer: Self-pay | Admitting: Family Medicine

## 2016-05-04 ENCOUNTER — Other Ambulatory Visit: Payer: Self-pay | Admitting: Family Medicine

## 2016-06-11 ENCOUNTER — Encounter: Payer: Self-pay | Admitting: Family Medicine

## 2016-06-11 ENCOUNTER — Ambulatory Visit (INDEPENDENT_AMBULATORY_CARE_PROVIDER_SITE_OTHER): Payer: Commercial Managed Care - HMO | Admitting: Family Medicine

## 2016-06-11 DIAGNOSIS — E785 Hyperlipidemia, unspecified: Secondary | ICD-10-CM

## 2016-06-12 ENCOUNTER — Ambulatory Visit (INDEPENDENT_AMBULATORY_CARE_PROVIDER_SITE_OTHER): Payer: Commercial Managed Care - HMO | Admitting: Family Medicine

## 2016-06-12 ENCOUNTER — Encounter: Payer: Self-pay | Admitting: Family Medicine

## 2016-06-12 VITALS — BP 110/68 | HR 80 | Resp 18 | Ht 64.0 in | Wt 176.0 lb

## 2016-06-12 DIAGNOSIS — Z9119 Patient's noncompliance with other medical treatment and regimen: Secondary | ICD-10-CM

## 2016-06-12 DIAGNOSIS — R809 Proteinuria, unspecified: Secondary | ICD-10-CM | POA: Diagnosis not present

## 2016-06-12 DIAGNOSIS — Z0000 Encounter for general adult medical examination without abnormal findings: Secondary | ICD-10-CM | POA: Diagnosis not present

## 2016-06-12 DIAGNOSIS — E119 Type 2 diabetes mellitus without complications: Secondary | ICD-10-CM

## 2016-06-12 DIAGNOSIS — E785 Hyperlipidemia, unspecified: Secondary | ICD-10-CM | POA: Diagnosis not present

## 2016-06-12 DIAGNOSIS — IMO0001 Reserved for inherently not codable concepts without codable children: Secondary | ICD-10-CM

## 2016-06-12 DIAGNOSIS — Z91199 Patient's noncompliance with other medical treatment and regimen due to unspecified reason: Secondary | ICD-10-CM

## 2016-06-12 LAB — GLUCOSE, POCT (MANUAL RESULT ENTRY): POC Glucose: 386 mg/dl — AB (ref 70–99)

## 2016-06-12 MED ORDER — RAMIPRIL 2.5 MG PO CAPS: 2.5000 mg | ORAL_CAPSULE | Freq: Every day | ORAL | Status: DC

## 2016-06-12 MED ORDER — INSULIN ASPART 100 UNIT/ML ~~LOC~~ SOLN
7.0000 [IU] | Freq: Once | SUBCUTANEOUS | Status: AC
  Administered 2016-06-12: 7 [IU] via SUBCUTANEOUS

## 2016-06-12 MED ORDER — GLIPIZIDE ER 2.5 MG PO TB24: 2.5000 mg | ORAL_TABLET | Freq: Every day | ORAL | Status: DC

## 2016-06-12 MED ORDER — METFORMIN HCL 500 MG PO TABS: 500.0000 mg | ORAL_TABLET | Freq: Every day | ORAL | Status: DC

## 2016-06-12 MED ORDER — PRAVASTATIN SODIUM 20 MG PO TABS: 20.0000 mg | ORAL_TABLET | Freq: Every day | ORAL | Status: DC

## 2016-06-12 NOTE — Patient Instructions (Signed)
F/U in 6 weeks with meter and blood sugar diary,NEED to take the 4 medications prescribed every day.  TEST and record ONCE daily  If blood sugar very high, I will need to increase the dose of blood sugar medication, pls call in if you have concerns about blood sugar being too high Goal for fasting blood sugar ranges from 80 to 120 and 2 hours after any meal or at bedtime should be between 130 to 170.  Labs today please and we will call tomorrow if any immediate changes need to be made withyour medication  Four prescribed medication Metformin 500 mg one daily at lunch  Glipizide XL 2.5 mg one daily with breakfast  Pravastatin 20 mg one at bedtime  Ramipril 2.5 mg one daily

## 2016-06-12 NOTE — Progress Notes (Signed)
Preventive Screening-Counseling & Management   Patient present here today for a Medicare annual wellness visit.   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 No  Over the past two weeks, have you felt little interest or pleasure in doing things? No  Have you lost interest or pleasure in daily life? No  Do you often feel hopeless? No  Do you cry easily over simple problems? No   Activities of Daily Living  In your present state of health, do you have any difficulty performing the following activities?  Driving?: No Managing money?: No Feeding yourself?:No Getting from bed to chair?:No Climbing a flight of stairs?:No Preparing food and eating?:No Bathing or showering?:No Getting dressed?:No Getting to the toilet?:No Using the toilet?:No Moving around from place to place?: No  Fall Risk Assessment In the past year have you fallen or had a near fall?: Yes x 1 while walking dog  Are you currently taking any medications that make you dizzy?:No   Hearing Difficulties: No Do you often ask people to speak up or repeat themselves?:No Do you experience ringing or noises in your ears?:No Do you have difficulty understanding soft or whispered voices?:No  Cognitive Testing  Alert? Yes Normal Appearance?Yes  Oriented to person? Yes Place? Yes  Time? Yes  Displays appropriate judgment?Yes  Can read the correct time from a watch face? yes Are you having problems  remembering things?No age appropriate   Advanced Directives have been discussed with the patient?Yes and copy of healthcare poa scanned into chart.  Helped patient to complete living will and she will have notarized.    List the Names of Other Physician/Practitioners you currently use: care teams up to date    Indicate any recent Medical Services you may have received from other than [facility] providers in the past year (date may be approximate).     Medicare Attestation  I have personally reviewed:  The patient's medical and social history  Their use of alcohol, tobacco or illicit drugs  Their current medications and supplements  The patient's functional ability including ADLs,fall risks, home safety risks, cognitive, and hearing and visual impairment  Diet and physical activities  Evidence for depression or mood disorders  The patient's weight, height, BMI, and visual acuity have been recorded in the chart. I have made referrals, counseling, and provided education to the patient based on review of the above and I have provided the patient with a written personalized care plan for preventive services.    Physical Exam BP 110/68 mmHg  Pulse 80  Resp 18  Ht  (1.626 m)  Wt 176 lb (79.833 kg)  BMI 30.20 kg/m2  SpO2 96%   Assessment & Plan:  Medicare annual wellness visit, subsequent Annual exam as documented. Counseling done  re healthy lifestyle involving commitment to 150 minutes exercise per week, heart healthy diet, and attaining healthy weight.The importance of adequate sleep also discussed. Regular seat belt use and home  safety, is also discussed. Changes in health habits are decided on by the patient with goals and time frames  set for achieving them. Immunization and cancer screening needs are specifically addressed at this visit.   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 F/u with log and meter. Glipizide dose increased to maximum dosing with updated lab Offered endo, currently refusing, will see how she does [name] is reminded of the importance of commitment to daily physical activity for 30 minutes or more, as able and the need to limit carbohydrate intake to 30 to 60 grams per meal to help with blood sugar control.   The need to take medication as prescribed, test blood sugar as directed, and to call between visits if there is a concern that blood sugar is uncontrolled is also discussed.   [name] is reminded of the importance of daily foot exam, annual eye examination, and good blood sugar, blood pressure and cholesterol control.  Diabetic Labs Latest Ref Rng 06/12/2016 10/26/2015 07/18/2015 04/14/2015 12/07/2014  HbA1c <5.7 % 12.8(H) 7.8(H) 7.9(H) 8.1(H) -  Microalbumin <2.0 mg/dL - - - 2.9(H) -  Micro/Creat Ratio 0.0 - 30.0 mg/g - - - 11.8 -  Chol 125 - 200 mg/dL 193 - 111(L) - -  HDL >=46 mg/dL 32(L) - 36(L) - -  Calc LDL <130 mg/dL 108 - 61 - 94  Triglycerides <150 mg/dL 265(H) - 71 - -  Creatinine 0.50 - 0.99 mg/dL 0.73 0.61 0.60 0.56 -   BP/Weight 06/12/2016 12/22/2015 08/22/2015 08/15/2015 08/01/2015 Q000111Q [id number]  Systolic BP A999333 0000000 [id number] [id number] Q000111Q Q000111Q [id number]  Diastolic BP 68 78 74 80 80 78 70  Wt. (Lbs) 176 188 192 192 193 193.04 190  BMI 30.2 32.25 32.94 32.94 33.11 33.12 32.6   Foot/eye exam completion dates Latest Ref Rng 08/15/2015 01/24/2015  Eye Exam No Retinopathy - No Retinopathy  Foot exam Order - - -  Foot Form Completion - Done -

## 2016-06-13 ENCOUNTER — Other Ambulatory Visit: Payer: Self-pay | Admitting: Family Medicine

## 2016-06-13 ENCOUNTER — Other Ambulatory Visit: Payer: Self-pay

## 2016-06-13 ENCOUNTER — Encounter: Payer: Self-pay | Admitting: Family Medicine

## 2016-06-13 LAB — COMPLETE METABOLIC PANEL WITH GFR
ALBUMIN: 4.2 g/dL (ref 3.6–5.1)
ALK PHOS: 105 U/L (ref 33–130)
ALT: 45 U/L — AB (ref 6–29)
AST: 29 U/L (ref 10–35)
BILIRUBIN TOTAL: 0.5 mg/dL (ref 0.2–1.2)
BUN: 12 mg/dL (ref 7–25)
CO2: 27 mmol/L (ref 20–31)
Calcium: 9.3 mg/dL (ref 8.6–10.4)
Chloride: 99 mmol/L (ref 98–110)
Creat: 0.73 mg/dL (ref 0.50–0.99)
GFR, Est African American: >89 mL/min (ref >=60)
GFR, Est Non African American: 89 mL/min (ref >=60)
GLUCOSE: 406 mg/dL — AB (ref 65–99)
Potassium: 3.9 mmol/L (ref 3.5–5.3)
SODIUM: 135 mmol/L (ref 135–146)
TOTAL PROTEIN: 7.6 g/dL (ref 6.1–8.1)

## 2016-06-13 LAB — HEMOGLOBIN A1C
Hgb A1c MFr Bld: 12.8 % — ABNORMAL HIGH (ref <5.7)
Mean Plasma Glucose: 321 mg/dL

## 2016-06-13 LAB — LIPID PANEL
Cholesterol: 193 mg/dL (ref 125–200)
HDL: 32 mg/dL — ABNORMAL LOW (ref >=46)
LDL Cholesterol: 108 mg/dL (ref <130)
Total CHOL/HDL Ratio: 6 Ratio — ABNORMAL HIGH (ref <=5.0)
Triglycerides: 265 mg/dL — ABNORMAL HIGH (ref <150)
VLDL: 53 mg/dL — ABNORMAL HIGH (ref <30)

## 2016-06-13 MED ORDER — GLIPIZIDE ER 10 MG PO TB24: 10.0000 mg | ORAL_TABLET | Freq: Every day | ORAL | Status: DC

## 2016-06-15 DIAGNOSIS — Z0000 Encounter for general adult medical examination without abnormal findings: Secondary | ICD-10-CM | POA: Insufficient documentation

## 2016-06-15 NOTE — Assessment & Plan Note (Signed)
" >>  ASSESSMENT AND PLAN FOR TYPE II DIABETES MELLITUS (HCC) WRITTEN ON 06/15/2016  8:14 AM BY Kemuel Buchmann E, MD  Uncontrolled due to non compliance.Regular insulin  administered in the office Resume ,medication, twice daily testing and weekly calls for med adjustment. F/u with log and meter. Glipizide  dose increased to maximum dosing with updated lab Offered endo, currently refusing, will see how she does [name] is reminded of the importance of commitment to daily physical activity for 30 minutes or more, as able and the need to limit carbohydrate intake to 30 to 60 grams per meal to help with blood sugar control.   The need to take medication as prescribed, test blood sugar as directed, and to call between visits if there is a concern that blood sugar is uncontrolled is also discussed.   [name] is reminded of the importance of daily foot exam, annual eye examination, and good blood sugar, blood pressure and cholesterol control.  Diabetic Labs Latest Ref Rng 06/12/2016 10/26/2015 07/18/2015 04/14/2015 12/07/2014  HbA1c <5.7 % 12.8(H) 7.8(H) 7.9(H) 8.1(H) -  Microalbumin <2.0 mg/dL - - - 2.9(H) -  Micro/Creat Ratio 0.0 - 30.0 mg/g - - - 11.8 -  Chol 125 - 200 mg/dL 806 - 888(O) - -  HDL >=53 mg/dL 67(O) - 63(O) - -  Calc LDL <130 mg/dL 891 - 61 - 94  Triglycerides <150 mg/dL 734(Y) - 71 - -  Creatinine 0.50 - 0.99 mg/dL 9.26 9.38 9.39 9.43 -   BP/Weight 06/12/2016 12/22/2015 08/22/2015 08/15/2015 08/01/2015 04/14/2015 01/03/2015  Systolic BP 110 128 122 114 132 134 122  Diastolic BP 68 78 74 80 80 78 70  Wt. (Lbs) 176 188 192 192 193 193.04 190  BMI 30.2 32.25 32.94 32.94 33.11 33.12 32.6   Foot/eye exam completion dates Latest Ref Rng 08/15/2015 01/24/2015  Eye Exam No Retinopathy - No Retinopathy  Foot exam Order - - -  Foot Form Completion - Done -        "

## 2016-06-15 NOTE — Assessment & Plan Note (Signed)
Discontinued all medication voluntarily, treating her diabetes "naturally" office blood sugar is over 300 Still refusing breast biopsy and treatment, radiologically appears to have breast cancer, re education done, still resistant

## 2016-06-15 NOTE — Progress Notes (Signed)
Pt did not keep appointment , therefore cancelled, no show

## 2016-06-15 NOTE — Assessment & Plan Note (Signed)
Uncontrolled due to non compliance.Regular insulin administered in the office Resume ,medication, twice daily testing and weekly calls for med adjustment. F/u with log and meter. Glipizide dose increased to maximum dosing with updated lab Offered endo, currently refusing, will see how she does [name] is reminded of the importance of commitment to daily physical activity for 30 minutes or more, as able and the need to limit carbohydrate intake to 30 to 60 grams per meal to help with blood sugar control.   The need to take medication as prescribed, test blood sugar as directed, and to call between visits if there is a concern that blood sugar is uncontrolled is also discussed.   [name] is reminded of the importance of daily foot exam, annual eye examination, and good blood sugar, blood pressure and cholesterol control.  Diabetic Labs Latest Ref Rng 06/12/2016 10/26/2015 07/18/2015 04/14/2015 12/07/2014  HbA1c <5.7 % 12.8(H) 7.8(H) 7.9(H) 8.1(H) -  Microalbumin <2.0 mg/dL - - - 2.9(H) -  Micro/Creat Ratio 0.0 - 30.0 mg/g - - - 11.8 -  Chol 125 - 200 mg/dL 193 - 111(L) - -  HDL >=46 mg/dL 32(L) - 36(L) - -  Calc LDL <130 mg/dL 108 - 61 - 94  Triglycerides <150 mg/dL 265(H) - 71 - -  Creatinine 0.50 - 0.99 mg/dL 0.73 0.61 0.60 0.56 -   BP/Weight 06/12/2016 12/22/2015 08/22/2015 08/15/2015 08/01/2015 Q000111Q [id number]  Systolic BP A999333 0000000 [id number] [id number] Q000111Q Q000111Q [id number]  Diastolic BP 68 78 74 80 80 78 70  Wt. (Lbs) 176 188 192 192 193 193.04 190  BMI 30.2 32.25 32.94 32.94 33.11 33.12 32.6   Foot/eye exam completion dates Latest Ref Rng 08/15/2015 01/24/2015  Eye Exam No Retinopathy - No Retinopathy  Foot exam Order - - -  Foot Form Completion - Done -

## 2016-06-15 NOTE — Assessment & Plan Note (Signed)

## 2016-07-23 ENCOUNTER — Ambulatory Visit: Payer: Self-pay | Admitting: Family Medicine

## 2016-08-30 ENCOUNTER — Ambulatory Visit: Payer: Self-pay | Admitting: Family Medicine

## 2016-09-02 IMAGING — US US TRANSVAGINAL NON-OB
1 series · 13 of 25 positions shown · non-contrast
Comparison: 05/25/2009, 02/21/2007.

CLINICAL DATA: 59-year-old with postmenopausal vaginal bleeding.

EXAM:
TRANSABDOMINAL AND TRANSVAGINAL ULTRASOUND OF PELVIS
TECHNIQUE: Both transabdominal and transvaginal ultrasound examinations of the
pelvis were performed. Transabdominal technique was performed for
global imaging of the pelvis including uterus, ovaries, adnexal
regions, and pelvic cul-de-sac. It was necessary to proceed with
endovaginal exam following the transabdominal exam to visualize the
endometrium and ovaries as the bladder is incompletely distended.

[Series 1: us transvaginal non-ob · 0.21mm/px · 13 of 83 slices shown]
[im 1/83]
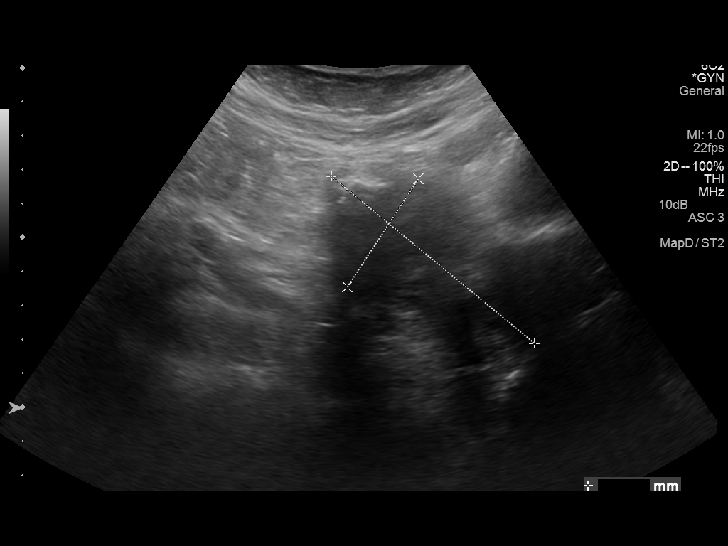
[im 7/83]
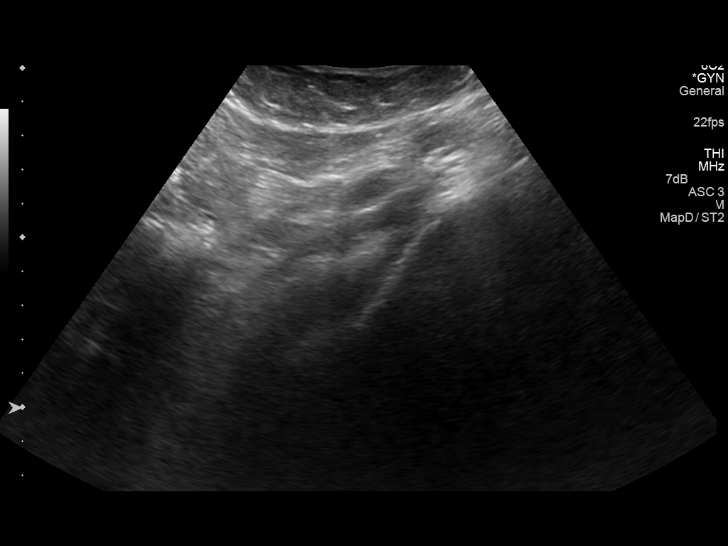
[im 14/83]
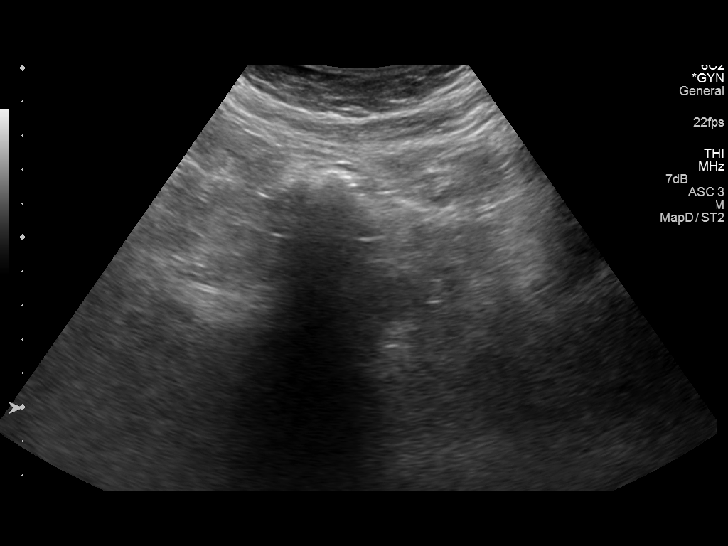
[im 21/83]
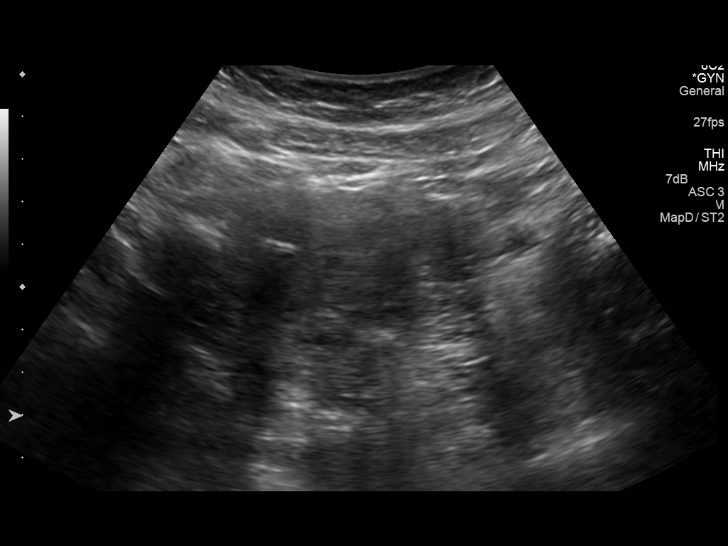
[im 28/83]
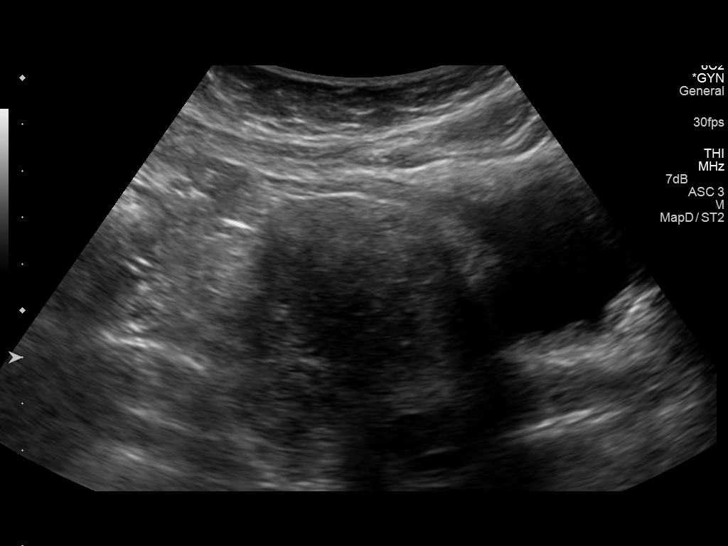
[im 35/83]
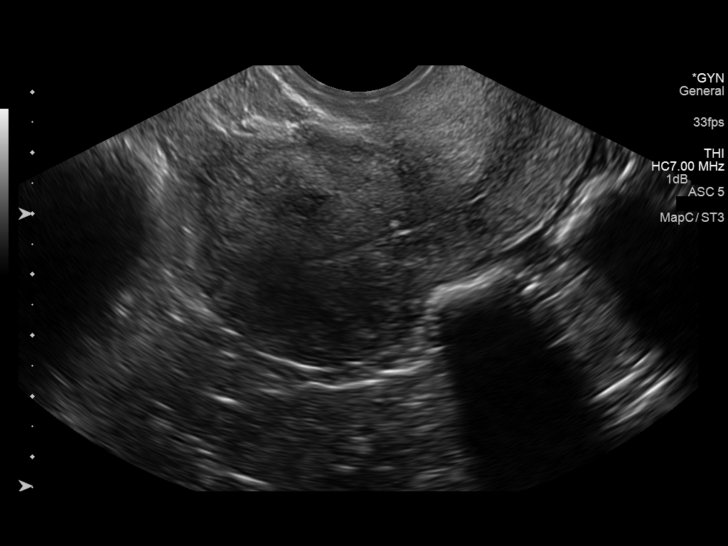
[im 42/83]
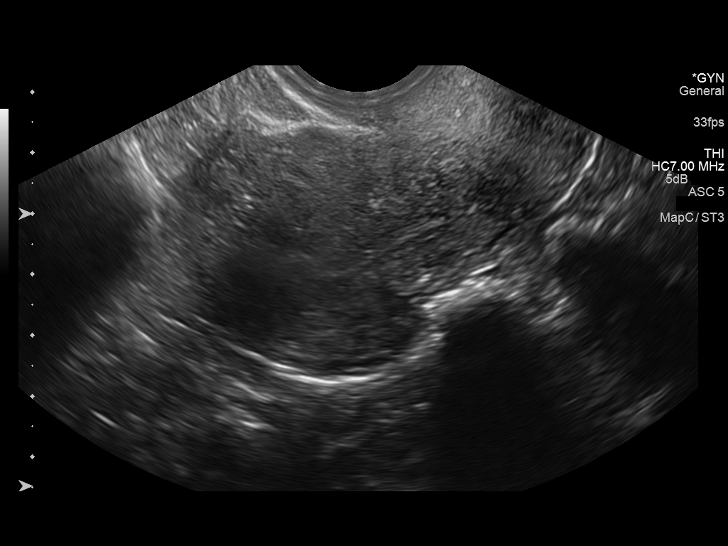
[im 48/83]
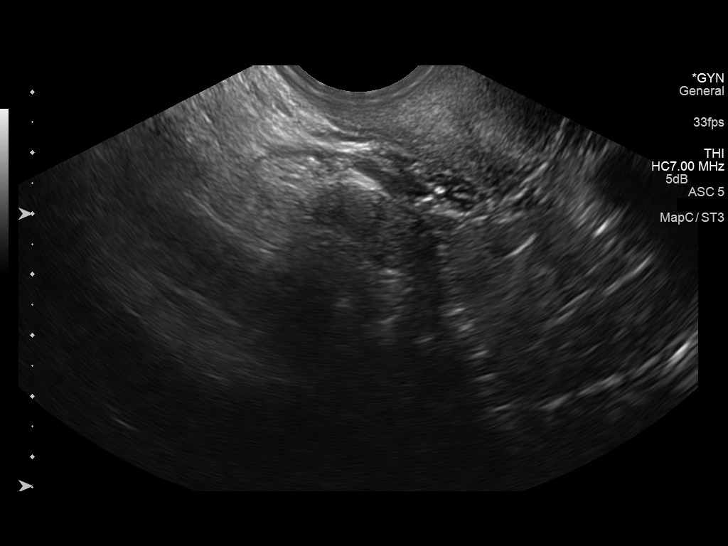
[im 55/83]
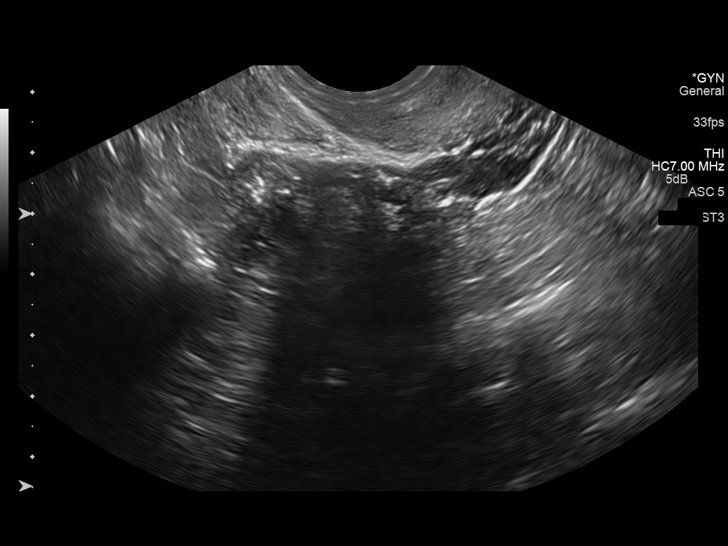
[im 62/83]
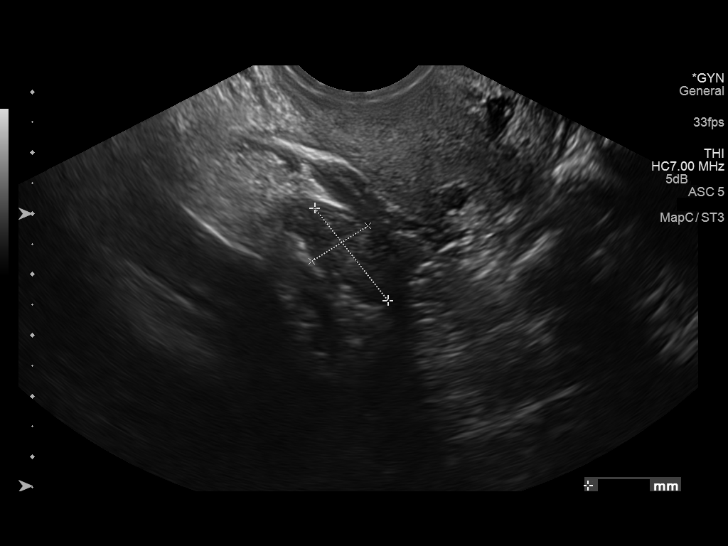
[im 69/83]
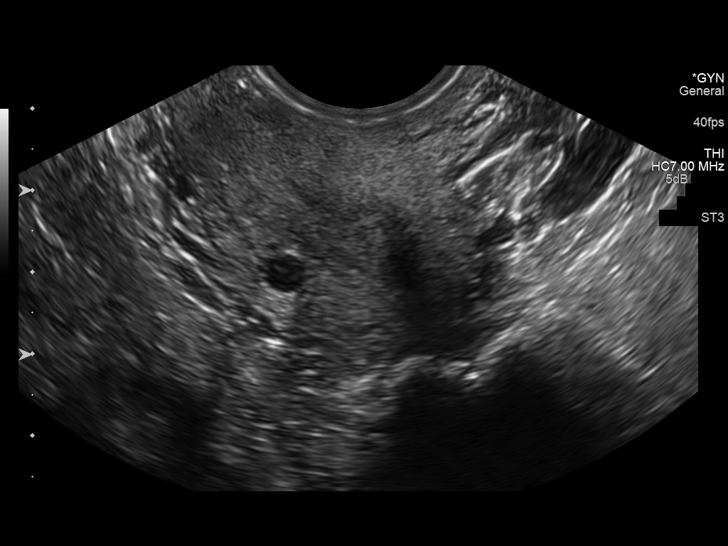
[im 76/83]
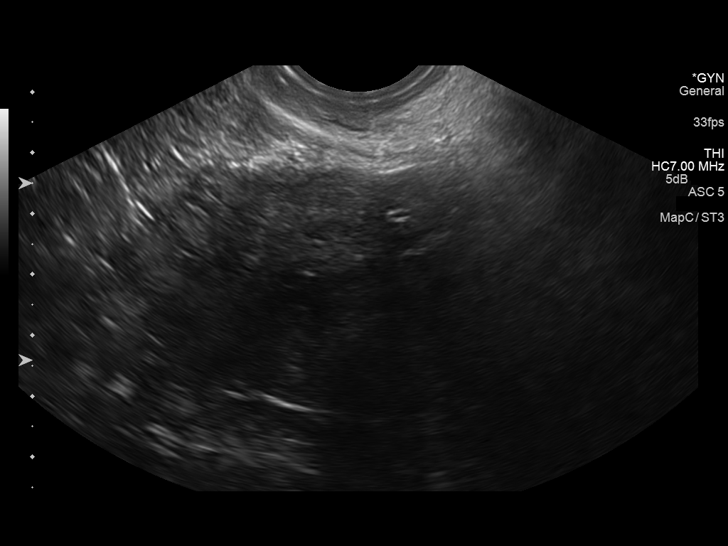
[im 83/83]
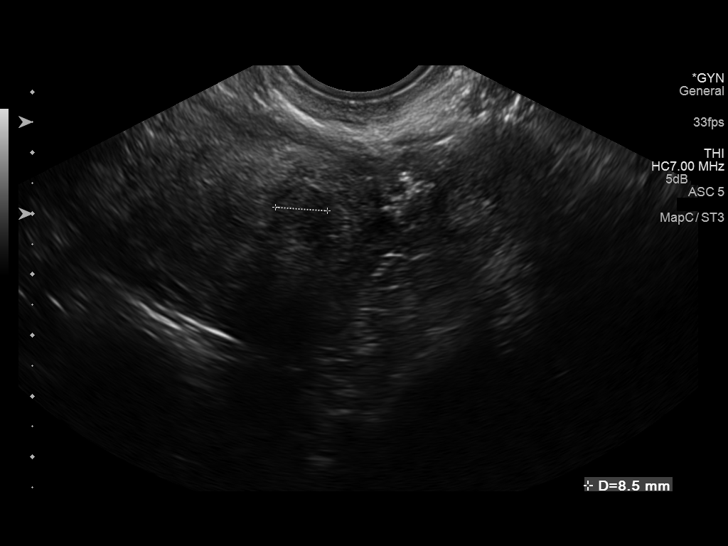

[13 of 25 positions shown; findings below may reference images not displayed]

FINDINGS: Uterus

Measurements: Approximately 7.8 x 4.2 x 7.7 cm, significantly
smaller than in 5959. Multiple fibroids, the largest measuring
approximately 3.4 x 3.4 x 2.4 cm arising from the left lateral
uterine body. A calcified, degenerated fibroid measuring
approximately 2.0 x 1.7 x 1.7 cm arises from the right lateral
uterine fundus. These have decreased in size since the 5959 study.

Two small nabothian cysts on the cervix, each measuring
approximately 6 mm.

Endometrium

Thickness: Approximately 3 mm. Normal appearance without evidence of
endometrial fluid or mass.

Right ovary

Measurements: Approximately 1.9 x 1.2 x 1.3 cm. No dominant cyst or
solid mass.

Left ovary

Measurements: Approximately 1.7 x 1.0 x 1.3 cm. No dominant cyst or
solid mass.

Other findings

No free fluid.
IMPRESSION: 1. Multiple uterine fibroids, the largest measured above. The
overall uterine size and the size of the fibroids has decreased
since the most recent prior ultrasound from 5959.
2. Normal appearing endometrium without evidence of endometrial
fluid or mass.
3. Mildly atrophic ovaries, normal for age.
4. No adnexal masses or free pelvic fluid.

## 2016-09-13 ENCOUNTER — Ambulatory Visit (INDEPENDENT_AMBULATORY_CARE_PROVIDER_SITE_OTHER): Payer: Commercial Managed Care - HMO | Admitting: Family Medicine

## 2016-09-13 ENCOUNTER — Encounter: Payer: Self-pay | Admitting: Family Medicine

## 2016-09-13 VITALS — BP 104/60 | HR 74 | Resp 18 | Ht 64.0 in | Wt 177.0 lb

## 2016-09-13 DIAGNOSIS — E6609 Other obesity due to excess calories: Secondary | ICD-10-CM

## 2016-09-13 DIAGNOSIS — Z23 Encounter for immunization: Secondary | ICD-10-CM | POA: Diagnosis not present

## 2016-09-13 DIAGNOSIS — E782 Mixed hyperlipidemia: Secondary | ICD-10-CM

## 2016-09-13 DIAGNOSIS — I10 Essential (primary) hypertension: Secondary | ICD-10-CM | POA: Diagnosis not present

## 2016-09-13 DIAGNOSIS — R928 Other abnormal and inconclusive findings on diagnostic imaging of breast: Secondary | ICD-10-CM

## 2016-09-13 DIAGNOSIS — E118 Type 2 diabetes mellitus with unspecified complications: Secondary | ICD-10-CM | POA: Diagnosis not present

## 2016-09-13 DIAGNOSIS — IMO0001 Reserved for inherently not codable concepts without codable children: Secondary | ICD-10-CM

## 2016-09-13 NOTE — Patient Instructions (Signed)
Annual physical exam in 4 weeks, call if you need me sooner  Labs today  You report taking glipizide 5 mg daily, not 10 mg , we will see if enough  Foot exam is good  Flu vaccine today

## 2016-09-14 LAB — COMPLETE METABOLIC PANEL WITH GFR
ALBUMIN: 3.9 g/dL (ref 3.6–5.1)
ALK PHOS: 80 U/L (ref 33–130)
ALT: 16 U/L (ref 6–29)
AST: 18 U/L (ref 10–35)
BUN: 13 mg/dL (ref 7–25)
CO2: 26 mmol/L (ref 20–31)
Calcium: 9.6 mg/dL (ref 8.6–10.4)
Chloride: 103 mmol/L (ref 98–110)
Creat: 0.6 mg/dL (ref 0.50–0.99)
GFR, Est African American: >89 mL/min (ref >=60)
GFR, Est Non African American: >89 mL/min (ref >=60)
GLUCOSE: 141 mg/dL — AB (ref 65–99)
POTASSIUM: 3.5 mmol/L (ref 3.5–5.3)
SODIUM: 140 mmol/L (ref 135–146)
TOTAL PROTEIN: 7.2 g/dL (ref 6.1–8.1)
Total Bilirubin: 0.4 mg/dL (ref 0.2–1.2)

## 2016-09-14 LAB — LIPID PANEL
CHOL/HDL RATIO: 4 ratio (ref <=5.0)
Cholesterol: 190 mg/dL (ref 125–200)
HDL: 48 mg/dL (ref >=46)
LDL CALC: 118 mg/dL (ref <130)
Triglycerides: 121 mg/dL (ref <150)
VLDL: 24 mg/dL (ref <30)

## 2016-09-14 LAB — HEMOGLOBIN A1C
Hgb A1c MFr Bld: 9.4 % — ABNORMAL HIGH (ref <5.7)
Mean Plasma Glucose: 223 mg/dL

## 2016-09-16 NOTE — Assessment & Plan Note (Signed)
Uncontrolled but improved, non compliant with med. Neds to start glipizide 10 mg as was prescribed 3 months ago. [name] is reminded of the importance of commitment to daily physical activity for 30 minutes or more, as able and the need to limit carbohydrate intake to 30 to 60 grams per meal to help with blood sugar control.   The need to take medication as prescribed, test blood sugar as directed, and to call between visits if there is a concern that blood sugar is uncontrolled is also discussed.   [name] is reminded of the importance of daily foot exam, annual eye examination, and good blood sugar, blood pressure and cholesterol control.  Diabetic Labs Latest Ref Rng & Units 09/13/2016 06/12/2016 10/26/2015 07/18/2015 04/14/2015  HbA1c <5.7 % 9.4(H) 12.8(H) 7.8(H) 7.9(H) 8.1(H)  Microalbumin <2.0 mg/dL - - - - 2.9(H)  Micro/Creat Ratio 0.0 - 30.0 mg/g - - - - 11.8  Chol 125 - 200 mg/dL 190 193 - 111(L) -  HDL >=46 mg/dL 48 32(L) - 36(L) -  Calc LDL <130 mg/dL 118 108 - 61 -  Triglycerides <150 mg/dL 121 265(H) - 71 -  Creatinine 0.50 - 0.99 mg/dL 0.60 0.73 0.61 0.60 0.56   BP/Weight 09/13/2016 06/12/2016 12/22/2015 08/22/2015 08/15/2015 08/01/2015 Q000111Q  Systolic BP 123456 A999333 0000000 [id number] 99991111 Q000111Q Q000111Q  Diastolic BP 60 68 78 74 80 80 78  Wt. (Lbs) 177.04 176 188 192 192 193 193.04  BMI 30.39 30.2 32.25 32.94 32.94 33.11 33.12   Foot/eye exam completion dates Latest Ref Rng & Units 08/15/2015 01/24/2015  Eye Exam No Retinopathy - No Retinopathy  Foot exam Order - - -  Foot Form Completion - Done -

## 2016-09-16 NOTE — Assessment & Plan Note (Signed)
unchanged Patient re-educated about  the importance of commitment to a  minimum of 150 minutes of exercise per week.  The importance of healthy food choices with portion control discussed. Encouraged to start a food diary, count calories and to consider  joining a support group. Sample diet sheets offered. Goals set by the patient for the next several months.   Weight /BMI 09/13/2016 06/12/2016 12/22/2015  WEIGHT 177 lb 0.6 oz 176 lb 188 lb  HEIGHT 5\' 4"  5\' 4"  5\' 4"   BMI 30.39 kg/m2 30.2 kg/m2 32.25 kg/m2

## 2016-09-16 NOTE — Progress Notes (Signed)
[name]     MRN: [id number]      DOB: 12-27-1955   HPI [name] is here for follow up and re-evaluation of chronic medical conditions, medication management and review of any available recent lab and radiology data.  Preventive health is updated, specifically  Cancer screening and Immunization.   Questions or concerns regarding consultations or procedures which the PT has had in the interim are  addressed. The PT denies any adverse reactions to current medications since the last visit. However not taking prescribed dose of glipizide, no reason There are no new concerns.  Denies polyuria, polydipsia, blurred vision , or hypoglycemic episodes.  ROS Denies recent fever or chills. Denies sinus pressure, nasal congestion, ear pain or sore throat. Denies chest congestion, productive cough or wheezing. Denies chest pains, palpitations and leg swelling Denies abdominal pain, nausea, vomiting,diarrhea or constipation.   Denies dysuria, frequency, hesitancy or incontinence. Denies joint pain, swelling and limitation in mobility. Denies headaches, seizures, numbness, or tingling. Denies depression, anxiety or insomnia. Denies skin break down or rash.   PE  BP 104/60   Pulse 74   Resp 18   Ht 5\' 4"  (1.626 m)   Wt 177 lb 0.6 oz (80.3 kg)   SpO2 97%   BMI 30.39 kg/m   Patient alert and oriented and in no cardiopulmonary distress.  HEENT: No facial asymmetry, EOMI,   oropharynx pink and moist.  Neck supple no JVD, no mass.  Chest: Clear to auscultation bilaterally.  CVS: S1, S2 no murmurs, no S3.Regular rate.  ABD: Soft non tender.   Ext: No edema  MS: Adequate ROM spine, shoulders, hips and knees.  Skin: Intact, no ulcerations or rash noted.  Psych: Good eye contact, normal affect. Memory intact not anxious or depressed appearing.  CNS: CN 2-12 intact, power,  normal throughout.no focal deficits noted.   Assessment & Plan  Essential hypertension Controlled, no  change in medication DASH diet and commitment to daily physical activity for a minimum of 30 minutes discussed and encouraged, as a part of hypertension management. The importance of attaining a healthy weight is also discussed.  BP/Weight 09/13/2016 06/12/2016 12/22/2015 08/22/2015 08/15/2015 08/01/2015 Q000111Q  Systolic BP 123456 A999333 0000000 [id number] 99991111 Q000111Q Q000111Q  Diastolic BP 60 68 78 74 80 80 78  Wt. (Lbs) 177.04 176 188 192 192 193 193.04  BMI 30.39 30.2 32.25 32.94 32.94 33.11 33.12       Type II diabetes mellitus (HCC) Uncontrolled but improved, non compliant with med. Neds to start glipizide 10 mg as was prescribed 3 months ago. [name] is reminded of the importance of commitment to daily physical activity for 30 minutes or more, as able and the need to limit carbohydrate intake to 30 to 60 grams per meal to help with blood sugar control.   The need to take medication as prescribed, test blood sugar as directed, and to call between visits if there is a concern that blood sugar is uncontrolled is also discussed.   [name] is reminded of the importance of daily foot exam, annual eye examination, and good blood sugar, blood pressure and cholesterol control.  Diabetic Labs Latest Ref Rng & Units 09/13/2016 06/12/2016 10/26/2015 07/18/2015 04/14/2015  HbA1c <5.7 % 9.4(H) 12.8(H) 7.8(H) 7.9(H) 8.1(H)  Microalbumin <2.0 mg/dL - - - - 2.9(H)  Micro/Creat Ratio 0.0 - 30.0 mg/g - - - - 11.8  Chol 125 - 200 mg/dL 190 193 - 111(L) -  HDL >=46  mg/dL 48 32(L) - 36(L) -  Calc LDL <130 mg/dL 118 108 - 61 -  Triglycerides <150 mg/dL 121 265(H) - 71 -  Creatinine 0.50 - 0.99 mg/dL 0.60 0.73 0.61 0.60 0.56   BP/Weight 09/13/2016 06/12/2016 12/22/2015 08/22/2015 08/15/2015 08/01/2015 Q000111Q  Systolic BP 123456 A999333 0000000 123XX123 99991111 Q000111Q Q000111Q  Diastolic BP 60 68 78 74 80 80 78  Wt. (Lbs) 177.04 176 188 192 192 193 193.04  BMI 30.39 30.2 32.25 32.94 32.94 33.11 33.12   Foot/eye exam completion dates Latest Ref Rng &  Units 08/15/2015 01/24/2015  Eye Exam No Retinopathy - No Retinopathy  Foot exam Order - - -  Foot Form Completion - Done -        Hyperlipidemia Uncontrolled Hyperlipidemia:Low fat diet discussed and encouraged.   Lipid Panel  Lab Results  Component Value Date   CHOL 190 09/13/2016   HDL 48 09/13/2016   LDLCALC 118 09/13/2016   TRIG 121 09/13/2016   CHOLHDL 4.0 09/13/2016   Needs to lower fat intake and increase med dose    Obesity unchanged Patient re-educated about  the importance of commitment to a  minimum of 150 minutes of exercise per week.  The importance of healthy food choices with portion control discussed. Encouraged to start a food diary, count calories and to consider  joining a support group. Sample diet sheets offered. Goals set by the patient for the next several months.   Weight /BMI 09/13/2016 06/12/2016 12/22/2015  WEIGHT 177 lb 0.6 oz 176 lb 188 lb  HEIGHT 5\' 4"  5\' 4"  5\' 4"   BMI 30.39 kg/m2 30.2 kg/m2 32.25 kg/m2      Abnormal mammogram Pt non compliant with recommendation to have biopsy for definitive management, reminded of need for same

## 2016-09-16 NOTE — Assessment & Plan Note (Signed)
" >>  ASSESSMENT AND PLAN FOR TYPE II DIABETES MELLITUS (HCC) WRITTEN ON 09/16/2016  9:33 PM BY Treydon Henricks E, MD  Uncontrolled but improved, non compliant with med. Neds to start glipizide  10 mg as was prescribed 3 months ago. [name] is reminded of the importance of commitment to daily physical activity for 30 minutes or more, as able and the need to limit carbohydrate intake to 30 to 60 grams per meal to help with blood sugar control.   The need to take medication as prescribed, test blood sugar as directed, and to call between visits if there is a concern that blood sugar is uncontrolled is also discussed.   [name] is reminded of the importance of daily foot exam, annual eye examination, and good blood sugar, blood pressure and cholesterol control.  Diabetic Labs Latest Ref Rng & Units 09/13/2016 06/12/2016 10/26/2015 07/18/2015 04/14/2015  HbA1c <5.7 % 9.4(H) 12.8(H) 7.8(H) 7.9(H) 8.1(H)  Microalbumin <2.0 mg/dL - - - - 2.9(H)  Micro/Creat Ratio 0.0 - 30.0 mg/g - - - - 11.8  Chol 125 - 200 mg/dL 809 806 - 888(O) -  HDL >=46 mg/dL 48 67(O) - 63(O) -  Calc LDL <130 mg/dL 881 891 - 61 -  Triglycerides <150 mg/dL 878 734(Y) - 71 -  Creatinine 0.50 - 0.99 mg/dL 9.39 9.26 9.38 9.39 9.43   BP/Weight 09/13/2016 06/12/2016 12/22/2015 08/22/2015 08/15/2015 08/01/2015 04/14/2015  Systolic BP 104 110 128 122 114 132 134  Diastolic BP 60 68 78 74 80 80 78  Wt. (Lbs) 177.04 176 188 192 192 193 193.04  BMI 30.39 30.2 32.25 32.94 32.94 33.11 33.12   Foot/eye exam completion dates Latest Ref Rng & Units 08/15/2015 01/24/2015  Eye Exam No Retinopathy - No Retinopathy  Foot exam Order - - -  Foot Form Completion - Done -       "

## 2016-09-16 NOTE — Assessment & Plan Note (Signed)
Uncontrolled Hyperlipidemia:Low fat diet discussed and encouraged.   Lipid Panel  Lab Results  Component Value Date   CHOL 190 09/13/2016   HDL 48 09/13/2016   LDLCALC 118 09/13/2016   TRIG 121 09/13/2016   CHOLHDL 4.0 09/13/2016   Needs to lower fat intake and increase med dose

## 2016-09-16 NOTE — Assessment & Plan Note (Signed)
Controlled, no change in medication DASH diet and commitment to daily physical activity for a minimum of 30 minutes discussed and encouraged, as a part of hypertension management. The importance of attaining a healthy weight is also discussed.  BP/Weight 09/13/2016 06/12/2016 12/22/2015 08/22/2015 08/15/2015 08/01/2015 Q000111Q  Systolic BP 123456 A999333 0000000 123XX123 [id number] Q000111Q Q000111Q  Diastolic BP 60 68 78 74 80 80 78  Wt. (Lbs) 177.04 176 188 192 192 193 193.04  BMI 30.39 30.2 32.25 32.94 32.94 33.11 33.12

## 2016-09-16 NOTE — Assessment & Plan Note (Signed)
Pt non compliant with recommendation to have biopsy for definitive management, reminded of need for same

## 2016-09-18 ENCOUNTER — Other Ambulatory Visit: Payer: Self-pay

## 2016-09-18 DIAGNOSIS — E782 Mixed hyperlipidemia: Secondary | ICD-10-CM

## 2016-09-18 MED ORDER — PRAVASTATIN SODIUM 40 MG PO TABS: 40.0000 mg | ORAL_TABLET | Freq: Every day | ORAL | 5 refills | Status: DC

## 2016-09-18 MED ORDER — GLIPIZIDE ER 10 MG PO TB24: 10.0000 mg | ORAL_TABLET | Freq: Every day | ORAL | 4 refills | Status: DC

## 2016-10-09 ENCOUNTER — Ambulatory Visit (INDEPENDENT_AMBULATORY_CARE_PROVIDER_SITE_OTHER): Payer: Commercial Managed Care - HMO | Admitting: Family Medicine

## 2016-10-09 ENCOUNTER — Encounter: Payer: Self-pay | Admitting: Family Medicine

## 2016-10-09 ENCOUNTER — Ambulatory Visit (HOSPITAL_COMMUNITY)
Admission: RE | Admit: 2016-10-09 | Discharge: 2016-10-09 | Disposition: A | Discharge status: Home / Self Care | Payer: Commercial Managed Care - HMO | Source: Ambulatory Visit | Attending: Family Medicine | Admitting: Family Medicine

## 2016-10-09 VITALS — BP 122/80 | HR 88 | Resp 16 | Ht 64.0 in | Wt 182.1 lb

## 2016-10-09 DIAGNOSIS — Z1211 Encounter for screening for malignant neoplasm of colon: Secondary | ICD-10-CM

## 2016-10-09 DIAGNOSIS — G8929 Other chronic pain: Secondary | ICD-10-CM

## 2016-10-09 DIAGNOSIS — Z0000 Encounter for general adult medical examination without abnormal findings: Secondary | ICD-10-CM | POA: Diagnosis not present

## 2016-10-09 DIAGNOSIS — E118 Type 2 diabetes mellitus with unspecified complications: Secondary | ICD-10-CM

## 2016-10-09 DIAGNOSIS — E782 Mixed hyperlipidemia: Secondary | ICD-10-CM

## 2016-10-09 DIAGNOSIS — M25511 Pain in right shoulder: Secondary | ICD-10-CM | POA: Diagnosis not present

## 2016-10-09 DIAGNOSIS — I10 Essential (primary) hypertension: Secondary | ICD-10-CM

## 2016-10-09 DIAGNOSIS — Z91199 Patient's noncompliance with other medical treatment and regimen due to unspecified reason: Secondary | ICD-10-CM

## 2016-10-09 DIAGNOSIS — Z9119 Patient's noncompliance with other medical treatment and regimen: Secondary | ICD-10-CM

## 2016-10-09 DIAGNOSIS — S4991XA Unspecified injury of right shoulder and upper arm, initial encounter: Secondary | ICD-10-CM | POA: Diagnosis not present

## 2016-10-09 LAB — POC HEMOCCULT BLD/STL (OFFICE/1-CARD/DIAGNOSTIC): Fecal Occult Blood, POC: NEGATIVE

## 2016-10-09 MED ORDER — HYDROCORTISONE 1 % RE CREA: TOPICAL_CREAM | RECTAL | 0 refills | Status: DC

## 2016-10-09 NOTE — Patient Instructions (Addendum)
F/u Jan 15 or shortly after , call if you need me sooner  Take ONE mertformin and ONE glipizide every day  Take ramipril one daily for kidneys  Microalb today  Check and record blood sugar bEFORE breakfast, range is 80 to 120  PLS reconsider mammogram, need this  Non fast HBA1C, chem 7 and eGFR, TSh, CBC Jan 12 or shortly after , but MUST be done at least 3 days prior to follow up  Cream sent for use on left great toenail bed

## 2016-10-10 ENCOUNTER — Other Ambulatory Visit (HOSPITAL_COMMUNITY)
Admission: RE | Admit: 2016-10-10 | Discharge: 2016-10-10 | Disposition: A | Discharge status: Home / Self Care | Payer: Commercial Managed Care - HMO | Source: Ambulatory Visit | Attending: Family Medicine | Admitting: Family Medicine

## 2016-10-10 DIAGNOSIS — E118 Type 2 diabetes mellitus with unspecified complications: Secondary | ICD-10-CM | POA: Insufficient documentation

## 2016-10-11 LAB — MICROALBUMIN / CREATININE URINE RATIO
CREATININE, UR: 83.2 mg/dL
MICROALB/CREAT RATIO: 28.7 mg/g{creat} (ref 0.0–30.0)
Microalb, Ur: 23.9 ug/mL — ABNORMAL HIGH

## 2016-10-14 NOTE — Progress Notes (Signed)
    [name]     MRN: [id number]      DOB: Apr 08, 1956  HPI: Patient is in for annual physical exam. No other health concerns are expressed or addressed at the visit. Recent labs, if available are reviewed. Immunization is reviewed , and  updated if needed.   PE:  BP 122/80   Pulse 88   Resp 16   Ht 5\' 4"  (1.626 m)   Wt 182 lb 1.9 oz (82.6 kg)   SpO2 97%   BMI 31.26 kg/m   Pleasant  female, alert and oriented x 3, in no cardio-pulmonary distress. Afebrile. HEENT No facial trauma or asymetry. Sinuses non tender.  Extra occullar muscles intact, pupils equally reactive to light. External ears normal, tympanic membranes clear. Oropharynx moist, no exudate. Neck: supple, no adenopathy,JVD or thyromegaly.No bruits.  Chest: Clear to ascultation bilaterally.No crackles or wheezes. Non tender to palpation  Breast: No asymetry,no masses or lumps. No tenderness. No nipple discharge or inversion. No axillary or supraclavicular adenopathy  Cardiovascular system; Heart sounds normal,  S1 and  S2 ,no S3.  No murmur, or thrill. Apical beat not displaced Peripheral pulses normal.  Abdomen: Soft, non tender, no organomegaly or masses. No bruits. Bowel sounds normal. No guarding, tenderness or rebound.  Rectal:  Normal sphincter tone. No rectal mass. Guaiac negative stool.  GU: External genitalia normal female genitalia , normal female distribution of hair. No lesions. Urethral meatus normal in size, no  Prolapse, no lesions visibly  Present. Bladder non tender. Vagina pink and moist , with no visible lesions , discharge present . Adequate pelvic support no  cystocele or rectocele noted Cervix pink and appears healthy, no lesions or ulcerations noted, no discharge noted from os Uterus normal size, no adnexal masses, no cervical motion or adnexal tenderness.   Musculoskeletal exam: Full ROM of spine, hips , shoulders and knees. No deformity ,swelling or crepitus  noted. No muscle wasting or atrophy.   Neurologic: Cranial nerves 2 to 12 intact. Power, tone ,sensation and reflexes normal throughout. No disturbance in gait. No tremor.  Skin: Intact, no ulceration, erythema , scaling or rash noted. Pigmentation normal throughout  Psych; Normal mood and affect. Judgement and concentration normal   Assessment & Plan:  Annual exam as documented

## 2016-10-14 NOTE — Assessment & Plan Note (Signed)
Continues to refuse mammogram and biopsy of sus[picious lesion of breast, left

## 2016-10-14 NOTE — Assessment & Plan Note (Signed)
After obtaining informed consent, the vaccine is  administered by LPN.  

## 2016-10-14 NOTE — Assessment & Plan Note (Signed)

## 2016-11-13 ENCOUNTER — Ambulatory Visit: Payer: Self-pay | Admitting: Orthopedic Surgery

## 2016-11-19 ENCOUNTER — Ambulatory Visit (INDEPENDENT_AMBULATORY_CARE_PROVIDER_SITE_OTHER): Payer: Commercial Managed Care - HMO | Admitting: Orthopedic Surgery

## 2016-11-19 ENCOUNTER — Encounter: Payer: Self-pay | Admitting: Orthopedic Surgery

## 2016-11-19 VITALS — BP 123/72 | HR 83 | Ht 64.0 in | Wt 181.0 lb

## 2016-11-19 DIAGNOSIS — M75101 Unspecified rotator cuff tear or rupture of right shoulder, not specified as traumatic: Secondary | ICD-10-CM | POA: Diagnosis not present

## 2016-11-19 DIAGNOSIS — M7501 Adhesive capsulitis of right shoulder: Secondary | ICD-10-CM | POA: Diagnosis not present

## 2016-11-19 NOTE — Patient Instructions (Signed)

## 2016-11-19 NOTE — Addendum Note (Signed)
Addended by: Baldomero Lamy B on: 11/19/2016 03:55 PM   Modules accepted: Orders

## 2016-11-19 NOTE — Progress Notes (Signed)
Patient ID: [name], female   DOB: December 16, 1955, 60 y.o.   MRN: [id number]  Chief Complaint  Patient presents with  . Shoulder Pain    Right shoulder pain, referred from Dr. Moshe Cipro.    HPI KENADEE Pennington is a 60 y.o. female.  Presents for evaluation right shoulder  Patient fell in July landed on her right arm now complains of pain and inability to lift the arm over her head  Pain right upper arm and shoulder, dull ache, moderate, 6 months, associated with motion loss  Review of Systems Review of Systems  Respiratory: Negative.   Cardiovascular: Negative.     Past Medical History:  Diagnosis Date  . Allergic rhinitis   . Anemia, deficiency   . Diabetes mellitus   . GERD (gastroesophageal reflux disease)   . Hyperlipidemia   . Hypertension   . Obesity     Past Surgical History:  Procedure Laterality Date  . EXTRACTION OF SEVENTH TEETH  2007  . REMOVAL OF GREAT TOENAIL  2005  . SPLENECTOMY  2002    Family History  Problem Relation Age of Onset  . Diabetes Mother   . Hypertension Mother   . Hyperlipidemia Mother   . Lung disease Father   . Hypertension Sister   . Hyperlipidemia Sister   . Arthritis    . Asthma    . Cancer      Social History Social History  Substance Use Topics  . Smoking status: Never Smoker  . Smokeless tobacco: Never Used  . Alcohol use No    Allergies  Allergen Reactions  . Codeine     Current Outpatient Prescriptions  Medication Sig Dispense Refill  . ACCU-CHEK AVIVA PLUS test strip USE TO TEST SUGAR DAILY 100 each 2  . aspirin 81 MG tablet Take 81 mg by mouth daily.      . Blood Glucose Monitoring Suppl (BAYER CONTOUR MONITOR) W/DEVICE KIT by Does not apply route daily.      . Calcium-Vitamin D (SUPER CALCIUM/D) 600-125 MG-UNIT TABS Take by mouth.      Marland Kitchen Cod Liver Oil CAPS Take by mouth.      Marland Kitchen Echinacea-Goldenseal (ECHINACEA COMB/GOLDEN SEAL PO) Take by mouth.    . Garlic Oil 027 MG CAPS Take by mouth daily.      Marland Kitchen  glipiZIDE (GLUCOTROL XL) 10 MG 24 hr tablet Take 1 tablet (10 mg total) by mouth daily with breakfast. 30 tablet 4  . hydrocortisone (PROCTOCORT) 1 % CREA Apply sparingly twice daily to left great toe nailbed for 1 week, then as needed 1 Tube 0  . metFORMIN (GLUCOPHAGE) 500 MG tablet Take 1 tablet (500 mg total) by mouth daily with supper. (Patient not taking: Reported on 10/09/2016) 90 tablet 0  . Multiple Vitamins-Minerals (MULTIPLE VITAMINS/WOMENS) tablet Take 1 tablet by mouth daily.      . Omega-3 Fatty Acids (FISH OIL) 1000 MG CAPS Take by mouth 2 (two) times daily.      . pravastatin (PRAVACHOL) 40 MG tablet Take 1 tablet (40 mg total) by mouth daily. 30 tablet 5  . ramipril (ALTACE) 2.5 MG capsule Take 1 capsule (2.5 mg total) by mouth daily. (Patient not taking: Reported on 10/09/2016) 90 capsule 0  . SURE COMFORT LANCETS 30G MISC TEST BLOOD SUGAR ONCE DAILY 100 each 1   No current facility-administered medications for this visit.        Physical Exam BP 123/72   Pulse 83   Ht 5'  4" (1.626 m)   Wt 181 lb (82.1 kg)   BMI 31.07 kg/m  Physical Exam  Constitutional: She is oriented to person, place, and time. She appears well-developed and well-nourished. No distress.  Cardiovascular: Normal rate and intact distal pulses.   Neurological: She is alert and oriented to person, place, and time. She has normal reflexes. She exhibits normal muscle tone. Coordination normal.  Skin: Skin is warm and dry. No rash noted. She is not diaphoretic. No erythema. No pallor.  Psychiatric: She has a normal mood and affect. Her behavior is normal. Judgment and thought content normal.   Ambulatory status normal with no assistive devices Right Shoulder Exam  Right shoulder exam is normal.  Tenderness  The patient is experiencing tenderness in the acromion.  Range of Motion  The patient has normal right shoulder ROM. Forward Flexion: 120  External Rotation: 30   Muscle Strength  The patient  has normal right shoulder strength.  Tests  Apprehension: negative Drop Arm: negative Impingement: positive  Other  Erythema: absent Sensation: normal Pulse: present   Left Shoulder Exam  Left shoulder exam is normal.  Tenderness  The patient is experiencing no tenderness.     Range of Motion  The patient has normal left shoulder ROM.  Muscle Strength  The patient has normal left shoulder strength.  Tests  Apprehension: negative  Other  Erythema: absent Sensation: normal Pulse: present        Data Reviewed Imaging of the 3 views including axillary x-ray shoulder are independently reviewed and I interpreted these as without fracture or glenohumeral arthritis  Assessment  Encounter Diagnoses  Name Primary?  . Adhesive capsulitis of right shoulder Yes  . Rotator cuff syndrome of right shoulder       Plan   Procedure note the subacromial injection shoulder RIGHT  Verbal consent was obtained to inject the  RIGHT   Shoulder  Timeout was completed to confirm the injection site is a subacromial space of the  RIGHT  shoulder   Medication used Depo-Medrol 40 mg and lidocaine 1% 3 cc  Anesthesia was provided by ethyl chloride  The injection was performed in the RIGHT  posterior subacromial space. After pinning the skin with alcohol and anesthetized the skin with ethyl chloride the subacromial space was injected using a 20-gauge needle. There were no complications  Sterile dressing was applied.  Physical therapy return 8 weeks

## 2017-01-14 ENCOUNTER — Ambulatory Visit: Payer: Self-pay | Admitting: Orthopedic Surgery

## 2017-01-21 ENCOUNTER — Ambulatory Visit: Payer: Self-pay | Admitting: Orthopedic Surgery

## 2017-02-11 ENCOUNTER — Ambulatory Visit: Payer: Self-pay | Admitting: Orthopedic Surgery

## 2017-02-18 DIAGNOSIS — I10 Essential (primary) hypertension: Secondary | ICD-10-CM | POA: Diagnosis not present

## 2017-02-18 DIAGNOSIS — E118 Type 2 diabetes mellitus with unspecified complications: Secondary | ICD-10-CM | POA: Diagnosis not present

## 2017-02-19 LAB — BASIC METABOLIC PANEL WITH GFR
BUN: 14 mg/dL (ref 7–25)
CHLORIDE: 99 mmol/L (ref 98–110)
CO2: 24 mmol/L (ref 20–31)
Calcium: 9.2 mg/dL (ref 8.6–10.4)
Creat: 0.67 mg/dL (ref 0.50–0.99)
GFR, Est African American: >89 mL/min (ref >=60)
Glucose, Bld: 373 mg/dL — ABNORMAL HIGH (ref 65–99)
POTASSIUM: 4.3 mmol/L (ref 3.5–5.3)
SODIUM: 134 mmol/L — AB (ref 135–146)

## 2017-02-19 LAB — CBC
HCT: 41.7 % (ref 35.0–45.0)
HEMOGLOBIN: 13.4 g/dL (ref 11.7–15.5)
MCH: 26.9 pg — ABNORMAL LOW (ref 27.0–33.0)
MCHC: 32.1 g/dL (ref 32.0–36.0)
MCV: 83.6 fL (ref 80.0–100.0)
MPV: 12.6 fL — AB (ref 7.5–12.5)
Platelets: 244 10*3/uL (ref 140–400)
RBC: 4.99 MIL/uL (ref 3.80–5.10)
RDW: 14.5 % (ref 11.0–15.0)
WBC: 9.8 10*3/uL (ref 3.8–10.8)

## 2017-02-19 LAB — HEMOGLOBIN A1C
Hgb A1c MFr Bld: 11.1 % — ABNORMAL HIGH (ref <5.7)
MEAN PLASMA GLUCOSE: 272 mg/dL

## 2017-02-19 LAB — TSH: TSH: 0.86 m[IU]/L

## 2017-02-25 ENCOUNTER — Ambulatory Visit: Payer: Self-pay

## 2017-02-28 ENCOUNTER — Ambulatory Visit: Payer: Self-pay

## 2017-02-28 ENCOUNTER — Ambulatory Visit: Payer: Self-pay | Admitting: Family Medicine

## 2017-03-11 ENCOUNTER — Encounter: Payer: Self-pay | Admitting: Orthopedic Surgery

## 2017-03-11 ENCOUNTER — Ambulatory Visit (INDEPENDENT_AMBULATORY_CARE_PROVIDER_SITE_OTHER): Payer: Medicare HMO | Admitting: Orthopedic Surgery

## 2017-03-11 DIAGNOSIS — M7501 Adhesive capsulitis of right shoulder: Secondary | ICD-10-CM | POA: Diagnosis not present

## 2017-03-11 DIAGNOSIS — M75101 Unspecified rotator cuff tear or rupture of right shoulder, not specified as traumatic: Secondary | ICD-10-CM

## 2017-03-11 NOTE — Patient Instructions (Signed)
HOME EXERCISES X 6 WEEKS

## 2017-03-11 NOTE — Progress Notes (Signed)
Chief Complaint  Patient presents with  . Follow-up    Recheck on right shoulder    61 year old female with adhesive capsulitis of the right shoulder treated with injections have physical therapy. She can go to therapy because it was too expensive so she did her own therapy based somewhat she saw on the Internet  She comes in complaining that her shoulder is much better range of motion has improved though not as good as the opposite side  NO Further injury   ROS: no numbness or tingling no neck pain at this time  EXAM:  Patient is awake alert and oriented 3 mood and affect are normal. He does exhibit normal shoulder stability normal internal/external strength and muscle tone skin is normal pulses are good sensation is normal Flexion on the right is now 100 external rotation is 45  Recommend home exercise program which we will give her a set Codman exercises to do for 6 weeks she will follow-up 7 weeks for reevaluation.  Encounter Diagnoses  Name Primary?  . Adhesive capsulitis of right shoulder Yes  . Rotator cuff syndrome of right shoulder

## 2017-03-26 ENCOUNTER — Other Ambulatory Visit: Payer: Self-pay | Admitting: Pharmacy Technician

## 2017-03-26 NOTE — Patient Outreach (Signed)
Patient answered phone and stated she was in the middle of doing something. I stated that I was calling in regards to Medication Adherence and had a couple questions about her medications. I asked patient to please call me back when it is convenient for her.   [name], [facility] [phone]

## 2017-04-30 ENCOUNTER — Ambulatory Visit (INDEPENDENT_AMBULATORY_CARE_PROVIDER_SITE_OTHER): Payer: Medicare HMO | Admitting: Orthopedic Surgery

## 2017-04-30 ENCOUNTER — Encounter: Payer: Self-pay | Admitting: Orthopedic Surgery

## 2017-04-30 DIAGNOSIS — M7501 Adhesive capsulitis of right shoulder: Secondary | ICD-10-CM

## 2017-04-30 NOTE — Progress Notes (Signed)
Follow-up visit  Chief Complaint  Patient presents with  . Follow-up    right shoulder pain    Adhesive capsulitis right shoulder  She is 61 she has adhesive capsulitis of the right shoulder she did a home exercise program and says that she has significantly improved  Review of systems she has no weakness in the right shoulder there is no numbness or tingling  Her exam shows no tenderness or swelling in the right shoulder she's regained full range of motion compared to the left with no instability normal strength neurovascular exam is intact in her skin was normal  Encounter Diagnosis  Name Primary?  . Adhesive capsulitis of right shoulder Yes    Plan is for her to be released from care and follow-up as needed

## 2017-05-02 ENCOUNTER — Ambulatory Visit: Payer: Medicare HMO

## 2017-05-06 ENCOUNTER — Ambulatory Visit: Payer: Medicare HMO

## 2017-05-13 ENCOUNTER — Ambulatory Visit: Payer: Medicare HMO

## 2017-05-13 ENCOUNTER — Telehealth: Payer: Self-pay

## 2017-05-13 NOTE — Telephone Encounter (Signed)
Called patient and rescheduled AWV for 06/17/2017 at 3:30 pm.

## 2017-06-17 ENCOUNTER — Ambulatory Visit (INDEPENDENT_AMBULATORY_CARE_PROVIDER_SITE_OTHER): Payer: Medicare HMO

## 2017-06-17 VITALS — BP 124/78 | HR 80 | Temp 98.4°F | Ht 64.0 in | Wt 175.0 lb

## 2017-06-17 DIAGNOSIS — Z0000 Encounter for general adult medical examination without abnormal findings: Secondary | ICD-10-CM

## 2017-06-17 DIAGNOSIS — I10 Essential (primary) hypertension: Secondary | ICD-10-CM | POA: Diagnosis not present

## 2017-06-17 DIAGNOSIS — E118 Type 2 diabetes mellitus with unspecified complications: Secondary | ICD-10-CM | POA: Diagnosis not present

## 2017-06-17 DIAGNOSIS — E782 Mixed hyperlipidemia: Secondary | ICD-10-CM | POA: Diagnosis not present

## 2017-06-17 NOTE — Patient Instructions (Addendum)
[name] , Thank you for taking time to come for your Medicare Wellness Visit. I appreciate your ongoing commitment to your health goals. Please review the following plan we discussed and let me know if I can assist you in the future.   Screening recommendations/referrals: Colonoscopy: Up to date, next due 01/2018 Mammogram: Due, declines Bone Density: Up to date Diabetic Eye Exam: Due Recommended yearly dental visit for hygiene and checkup  Vaccinations: Influenza vaccine: Up to date, next due 09/2017 Pneumococcal vaccine: Up to date, next due at age 68 Tdap vaccine: Up to date, next due 12/2022 Shingles vaccine: Due, declines    Advanced directives: Discussed and a copy is in your chart.  Conditions/risks identified: Obese, recommend increasing your exercise program to at least 3 days a week for 30-45 minutes at a time as tolerated.   Next appointment: Follow up as soon as possible with Dr. Moshe Cipro. Please have lab work completed prior to your visit. Follow up in 1 year for your annual wellness visit.   Preventive Care 40-64 Years, Female Preventive care refers to lifestyle choices and visits with your health care provider that can promote health and wellness. What does preventive care include?  A yearly physical exam. This is also called an annual well check.  Dental exams once or twice a year.  Routine eye exams. Ask your health care provider how often you should have your eyes checked.  Personal lifestyle choices, including:  Daily care of your teeth and gums.  Regular physical activity.  Eating a healthy diet.  Avoiding tobacco and drug use.  Limiting alcohol use.  Practicing safe sex.  Taking low-dose aspirin daily starting at age 52.  Taking vitamin and mineral supplements as recommended by your health care provider. What happens during an annual well check? The services and screenings done by your health care provider during your annual well check will depend  on your age, overall health, lifestyle risk factors, and family history of disease. Counseling  Your health care provider may ask you questions about your:  Alcohol use.  Tobacco use.  Drug use.  Emotional well-being.  Home and relationship well-being.  Sexual activity.  Eating habits.  Work and work Statistician.  Method of birth control.  Menstrual cycle.  Pregnancy history. Screening  You may have the following tests or measurements:  Height, weight, and BMI.  Blood pressure.  Lipid and cholesterol levels. These may be checked every 5 years, or more frequently if you are over 61 years old.  Skin check.  Lung cancer screening. You may have this screening every year starting at age 49 if you have a 30-pack-year history of smoking and currently smoke or have quit within the past 15 years.  Fecal occult blood test (FOBT) of the stool. You may have this test every year starting at age 20.  Flexible sigmoidoscopy or colonoscopy. You may have a sigmoidoscopy every 5 years or a colonoscopy every 10 years starting at age 53.  Hepatitis C blood test.  Hepatitis B blood test.  Sexually transmitted disease (STD) testing.  Diabetes screening. This is done by checking your blood sugar (glucose) after you have not eaten for a while (fasting). You may have this done every 1-3 years.  Mammogram. This may be done every 1-2 years. Talk to your health care provider about when you should start having regular mammograms. This may depend on whether you have a family history of breast cancer.  BRCA-related cancer screening. This may be done  if you have a family history of breast, ovarian, tubal, or peritoneal cancers.  Pelvic exam and Pap test. This may be done every 3 years starting at age 48. Starting at age 58, this may be done every 5 years if you have a Pap test in combination with an HPV test.  Bone density scan. This is done to screen for osteoporosis. You may have this scan  if you are at high risk for osteoporosis. Discuss your test results, treatment options, and if necessary, the need for more tests with your health care provider. Vaccines  Your health care provider may recommend certain vaccines, such as:  Influenza vaccine. This is recommended every year.  Tetanus, diphtheria, and acellular pertussis (Tdap, Td) vaccine. You may need a Td booster every 10 years.  Zoster vaccine. You may need this after age 59.  Pneumococcal 13-valent conjugate (PCV13) vaccine. You may need this if you have certain conditions and were not previously vaccinated.  Pneumococcal polysaccharide (PPSV23) vaccine. You may need one or two doses if you smoke cigarettes or if you have certain conditions. Talk to your health care provider about which screenings and vaccines you need and how often you need them. This information is not intended to replace advice given to you by your health care provider. Make sure you discuss any questions you have with your health care provider. Document Released: 12/16/2015 Document Revised: 08/08/2016 Document Reviewed: 09/20/2015 Elsevier Interactive Patient Education  2017 [employer] Prevention in the Home Falls can cause injuries. They can happen to people of all ages. There are many things you can do to make your home safe and to help prevent falls. What can I do on the outside of my home?  Regularly fix the edges of walkways and driveways and fix any cracks.  Remove anything that might make you trip as you walk through a door, such as a raised step or threshold.  Trim any bushes or trees on the path to your home.  Use bright outdoor lighting.  Clear any walking paths of anything that might make someone trip, such as rocks or tools.  Regularly check to see if handrails are loose or broken. Make sure that both sides of any steps have handrails.  Any raised decks and porches should have guardrails on the edges.  Have any  leaves, snow, or ice cleared regularly.  Use sand or salt on walking paths during winter.  Clean up any spills in your garage right away. This includes oil or grease spills. What can I do in the bathroom?  Use night lights.  Install grab bars by the toilet and in the tub and shower. Do not use towel bars as grab bars.  Use non-skid mats or decals in the tub or shower.  If you need to sit down in the shower, use a plastic, non-slip stool.  Keep the floor dry. Clean up any water that spills on the floor as soon as it happens.  Remove soap buildup in the tub or shower regularly.  Attach bath mats securely with double-sided non-slip rug tape.  Do not have throw rugs and other things on the floor that can make you trip. What can I do in the bedroom?  Use night lights.  Make sure that you have a light by your bed that is easy to reach.  Do not use any sheets or blankets that are too big for your bed. They should not hang down onto the floor.  Have a firm chair that has side arms. You can use this for support while you get dressed.  Do not have throw rugs and other things on the floor that can make you trip. What can I do in the kitchen?  Clean up any spills right away.  Avoid walking on wet floors.  Keep items that you use a lot in easy-to-reach places.  If you need to reach something above you, use a strong step stool that has a grab bar.  Keep electrical cords out of the way.  Do not use floor polish or wax that makes floors slippery. If you must use wax, use non-skid floor wax.  Do not have throw rugs and other things on the floor that can make you trip. What can I do with my stairs?  Do not leave any items on the stairs.  Make sure that there are handrails on both sides of the stairs and use them. Fix handrails that are broken or loose. Make sure that handrails are as long as the stairways.  Check any carpeting to make sure that it is firmly attached to the stairs.  Fix any carpet that is loose or worn.  Avoid having throw rugs at the top or bottom of the stairs. If you do have throw rugs, attach them to the floor with carpet tape.  Make sure that you have a light switch at the top of the stairs and the bottom of the stairs. If you do not have them, ask someone to add them for you. What else can I do to help prevent falls?  Wear shoes that:  Do not have high heels.  Have rubber bottoms.  Are comfortable and fit you well.  Are closed at the toe. Do not wear sandals.  If you use a stepladder:  Make sure that it is fully opened. Do not climb a closed stepladder.  Make sure that both sides of the stepladder are locked into place.  Ask someone to hold it for you, if possible.  Clearly mark and make sure that you can see:  Any grab bars or handrails.  First and last steps.  Where the edge of each step is.  Use tools that help you move around (mobility aids) if they are needed. These include:  Canes.  Walkers.  Scooters.  Crutches.  Turn on the lights when you go into a dark area. Replace any light bulbs as soon as they burn out.  Set up your furniture so you have a clear path. Avoid moving your furniture around.  If any of your floors are uneven, fix them.  If there are any pets around you, be aware of where they are.  Review your medicines with your doctor. Some medicines can make you feel dizzy. This can increase your chance of falling. Ask your doctor what other things that you can do to help prevent falls. This information is not intended to replace advice given to you by your health care provider. Make sure you discuss any questions you have with your health care provider. Document Released: 09/15/2009 Document Revised: 04/26/2016 Document Reviewed: 12/24/2014 Elsevier Interactive Patient Education  2017 [employer].

## 2017-06-17 NOTE — Progress Notes (Signed)
Subjective:   [name] is a 61 y.o. female who presents for Medicare Annual (Subsequent) preventive examination.  Review of Systems:  Cardiac Risk Factors include: diabetes mellitus;dyslipidemia;hypertension;obesity (BMI >30kg/m2)     Objective:     Vitals: BP 124/78   Pulse 80   Temp 98.4 F (36.9 C) (Oral)   Ht '5\' 4"'  (1.626 m)   Wt 175 lb 0.6 oz (79.4 kg)   BMI 30.05 kg/m   Body mass index is 30.05 kg/m.   Tobacco History  Smoking Status  . Never Smoker  Smokeless Tobacco  . Never Used     Counseling given: Not Answered   Past Medical History:  Diagnosis Date  . Allergic rhinitis   . Anemia, deficiency   . Diabetes mellitus   . GERD (gastroesophageal reflux disease)   . Hyperlipidemia   . Hypertension   . Obesity    Past Surgical History:  Procedure Laterality Date  . EXTRACTION OF SEVENTH TEETH  2007  . REMOVAL OF GREAT TOENAIL  2005  . SPLENECTOMY  2002   Family History  Problem Relation Age of Onset  . Diabetes Mother   . Hypertension Mother   . Hyperlipidemia Mother   . Lung disease Father   . Hypertension Sister   . Hyperlipidemia Sister   . Hypertension Brother   . Sickle cell trait Son   . Arthritis Unknown   . Asthma Unknown   . Cancer Unknown    History  Sexual Activity  . Sexual activity: Yes  . Birth control/ protection: Post-menopausal    Outpatient Encounter Prescriptions as of 06/17/2017  Medication Sig  . aspirin 81 MG tablet Take 81 mg by mouth daily.    . Calcium-Vitamin D (SUPER CALCIUM/D) 600-125 MG-UNIT TABS Take 1 tablet by mouth daily.   Marland Kitchen Cod Liver Oil CAPS Take 1 capsule by mouth daily.   Marland Kitchen Echinacea-Goldenseal (ECHINACEA COMB/GOLDEN SEAL PO) Take by mouth.  . Garlic Oil 712 MG CAPS Take 1 capsule by mouth daily.   . hydrocortisone (PROCTOCORT) 1 % CREA Apply sparingly twice daily to left great toe nailbed for 1 week, then as needed  . Multiple Vitamins-Minerals (MULTIPLE VITAMINS/WOMENS) tablet Take 1  tablet by mouth daily.    . Omega-3 Fatty Acids (FISH OIL) 1000 MG CAPS Take by mouth 2 (two) times daily.    Marland Kitchen ACCU-CHEK AVIVA PLUS test strip USE TO TEST SUGAR DAILY (Patient not taking: Reported on 06/17/2017)  . Blood Glucose Monitoring Suppl (BAYER CONTOUR MONITOR) W/DEVICE KIT by Does not apply route daily.    Marland Kitchen glipiZIDE (GLUCOTROL XL) 10 MG 24 hr tablet Take 1 tablet (10 mg total) by mouth daily with breakfast. (Patient not taking: Reported on 06/17/2017)  . metFORMIN (GLUCOPHAGE) 500 MG tablet Take 1 tablet (500 mg total) by mouth daily with supper. (Patient not taking: Reported on 06/17/2017)  . pravastatin (PRAVACHOL) 40 MG tablet Take 1 tablet (40 mg total) by mouth daily. (Patient not taking: Reported on 06/17/2017)  . ramipril (ALTACE) 2.5 MG capsule Take 1 capsule (2.5 mg total) by mouth daily. (Patient not taking: Reported on 06/17/2017)  . SURE COMFORT LANCETS 30G MISC TEST BLOOD SUGAR ONCE DAILY (Patient not taking: Reported on 06/17/2017)   No facility-administered encounter medications on file as of 06/17/2017.     Activities of Daily Living In your present state of health, do you have any difficulty performing the following activities: 06/17/2017  Hearing? N  Vision? N  Difficulty concentrating or  making decisions? N  Walking or climbing stairs? N  Dressing or bathing? N  Doing errands, shopping? N  Preparing Food and eating ? N  Using the Toilet? N  In the past six months, have you accidently leaked urine? N  Do you have problems with loss of bowel control? N  Managing your Medications? N  Managing your Finances? N  Housekeeping or managing your Housekeeping? N  Some recent data might be hidden    Patient Care Team: [name], MD as PCP - General Carole Civil, MD as Consulting Physician (Orthopedic Surgery)    Assessment:    Exercise Activities and Dietary recommendations Current Exercise Habits: Home exercise routine, Type of exercise: stretching,  Time (Minutes): 15, Frequency (Times/Week): 7, Weekly Exercise (Minutes/Week): 105, Intensity: Mild  Goals    None     Fall Risk Fall Risk  06/17/2017 06/12/2016 04/14/2015  Falls in the past year? No No No   Depression Screen PHQ 2/9 Scores 06/17/2017 06/12/2016 04/14/2015 04/07/2014  PHQ - 2 Score 0 0 0 0  PHQ- 9 Score 0 - - -     Cognitive Function     6CIT Screen 06/17/2017  What Year? 0 points  What month? 0 points  What time? 0 points  Count back from 20 0 points  Months in reverse 0 points  Repeat phrase 0 points  Total Score 0    Immunization History  Administered Date(s) Administered  . H1N1 12/28/2008  . Influenza Split 10/01/2011, 10/09/2012  . Influenza Whole 08/13/2007, 09/06/2008, 08/24/2009, 11/21/2010  . Influenza,inj,Quad PF,36+ Mos 09/10/2013, 08/11/2014, 08/02/2015, 09/13/2016  . PPD Test 06/09/2013  . Pneumococcal Conjugate-13 04/14/2015  . Pneumococcal Polysaccharide-23 10/25/2004, 09/10/2013  . Td 12/04/1995, 08/24/2009  . Tdap 12/25/2012   Screening Tests Health Maintenance  Topic Date Due  . OPHTHALMOLOGY EXAM  01/25/2016  . MAMMOGRAM  06/18/2019 (Originally 01/14/2015)  . INFLUENZA VACCINE  07/03/2017  . HEMOGLOBIN A1C  08/21/2017  . FOOT EXAM  10/09/2017  . COLONOSCOPY  01/04/2018  . PAP SMEAR  08/14/2018  . TETANUS/TDAP  12/25/2022  . PNEUMOCOCCAL POLYSACCHARIDE VACCINE  Completed  . Hepatitis C Screening  Completed  . HIV Screening  Completed      Plan:   I have personally reviewed and noted the following in the patient's chart:   . Medical and social history . Use of alcohol, tobacco or illicit drugs  . Current medications and supplements . Functional ability and status . Nutritional status . Physical activity . Advanced directives . List of other physicians . Hospitalizations, surgeries, and ER visits in previous 12 months . Vitals . Screenings to include cognitive, depression, and falls . Referrals and appointments  In  addition, I have reviewed and discussed with patient certain preventive protocols, quality metrics, and best practice recommendations. A written personalized care plan for preventive services as well as general preventive health recommendations were provided to patient.     Stormy Fabian, LPN  9/79/1504

## 2017-06-18 NOTE — Addendum Note (Signed)
Addended by: Stormy Fabian D on: 06/18/2017 09:15 AM   Modules accepted: Orders

## 2017-07-11 ENCOUNTER — Ambulatory Visit: Payer: Medicare HMO | Admitting: Family Medicine

## 2017-07-12 ENCOUNTER — Telehealth: Payer: Self-pay | Admitting: Family Medicine

## 2017-07-19 DIAGNOSIS — E118 Type 2 diabetes mellitus with unspecified complications: Secondary | ICD-10-CM | POA: Diagnosis not present

## 2017-07-19 DIAGNOSIS — I10 Essential (primary) hypertension: Secondary | ICD-10-CM | POA: Diagnosis not present

## 2017-07-19 DIAGNOSIS — E782 Mixed hyperlipidemia: Secondary | ICD-10-CM | POA: Diagnosis not present

## 2017-07-20 LAB — COMPLETE METABOLIC PANEL WITH GFR
ALT: 21 U/L (ref 6–29)
AST: 16 U/L (ref 10–35)
Albumin: 3.8 g/dL (ref 3.6–5.1)
Alkaline Phosphatase: 90 U/L (ref 33–130)
BUN: 16 mg/dL (ref 7–25)
CALCIUM: 9.1 mg/dL (ref 8.6–10.4)
CHLORIDE: 102 mmol/L (ref 98–110)
CO2: 24 mmol/L (ref 20–32)
CREATININE: 0.67 mg/dL (ref 0.50–0.99)
GFR, Est African American: >89 mL/min (ref >=60)
GFR, Est Non African American: >89 mL/min (ref >=60)
Glucose, Bld: 350 mg/dL — ABNORMAL HIGH (ref 65–99)
Potassium: 3.9 mmol/L (ref 3.5–5.3)
Sodium: 136 mmol/L (ref 135–146)
Total Bilirubin: 0.4 mg/dL (ref 0.2–1.2)
Total Protein: 7.2 g/dL (ref 6.1–8.1)

## 2017-07-20 LAB — LIPID PANEL
CHOLESTEROL: 175 mg/dL (ref <200)
HDL: 35 mg/dL — ABNORMAL LOW (ref >50)
LDL CALC: 118 mg/dL — AB (ref <100)
Total CHOL/HDL Ratio: 5 Ratio — ABNORMAL HIGH (ref <5.0)
Triglycerides: 108 mg/dL (ref <150)
VLDL: 22 mg/dL (ref <30)

## 2017-07-20 LAB — HEPATIC FUNCTION PANEL
ALT: 21 U/L (ref 6–29)
AST: 16 U/L (ref 10–35)
Albumin: 3.8 g/dL (ref 3.6–5.1)
Alkaline Phosphatase: 90 U/L (ref 33–130)
BILIRUBIN DIRECT: 0.1 mg/dL (ref <=0.2)
BILIRUBIN INDIRECT: 0.3 mg/dL (ref 0.2–1.2)
Total Bilirubin: 0.4 mg/dL (ref 0.2–1.2)
Total Protein: 7.2 g/dL (ref 6.1–8.1)

## 2017-07-20 LAB — HEMOGLOBIN A1C
Hgb A1c MFr Bld: 14 % — ABNORMAL HIGH (ref <5.7)
MEAN PLASMA GLUCOSE: 355 mg/dL

## 2017-07-23 ENCOUNTER — Ambulatory Visit: Payer: Medicare HMO | Admitting: Family Medicine

## 2017-07-24 ENCOUNTER — Ambulatory Visit (INDEPENDENT_AMBULATORY_CARE_PROVIDER_SITE_OTHER): Payer: Medicare HMO | Admitting: Family Medicine

## 2017-07-24 ENCOUNTER — Encounter: Payer: Self-pay | Admitting: Family Medicine

## 2017-07-24 VITALS — BP 118/62 | HR 87 | Temp 98.4°F | Resp 96 | Ht 64.0 in | Wt 177.2 lb

## 2017-07-24 DIAGNOSIS — E782 Mixed hyperlipidemia: Secondary | ICD-10-CM | POA: Diagnosis not present

## 2017-07-24 DIAGNOSIS — Z91199 Patient's noncompliance with other medical treatment and regimen due to unspecified reason: Secondary | ICD-10-CM

## 2017-07-24 DIAGNOSIS — Z9119 Patient's noncompliance with other medical treatment and regimen: Secondary | ICD-10-CM

## 2017-07-24 DIAGNOSIS — I10 Essential (primary) hypertension: Secondary | ICD-10-CM

## 2017-07-24 DIAGNOSIS — E118 Type 2 diabetes mellitus with unspecified complications: Secondary | ICD-10-CM | POA: Diagnosis not present

## 2017-07-24 MED ORDER — GLIPIZIDE ER 10 MG PO TB24: 10.0000 mg | ORAL_TABLET | Freq: Every day | ORAL | 5 refills | Status: DC

## 2017-07-24 MED ORDER — ROSUVASTATIN CALCIUM 10 MG PO TABS: 10.0000 mg | ORAL_TABLET | Freq: Every day | ORAL | 5 refills | Status: DC

## 2017-07-24 NOTE — Patient Instructions (Addendum)
Physical exam  Nov 28 or after, call if you need me sooner  Fasting lipid, cmp and EGFR HBA1C 3 to 5 daysdays before next visit  Start glipizide 10 mg one daily at breakfast for diabetes  Check and write down morning blood sugar before you eat, goal is 80 to 130  New is cretor at bedtime for cholsterol  Stop cakes, sweets, ice cream, fried and fatty foods, reduc breads  INCREASE vegetables, and for sweet eat fruit , fresh or frozen, no added sugar  WATER, wAter and water  It is important that you exercise regularly at least 30 minutes 7 times a week. If you develop chest pain, have severe difficulty breathing, or feel very tired, stop exercising immediately and seek medical attention

## 2017-07-24 NOTE — Progress Notes (Signed)
[name]     MRN: [id number]      DOB: 1956/06/21   HPI [name] is here for follow up and re-evaluation of chronic medical conditions, medication management and review of any available recent lab and radiology data.  Preventive health is updated, specifically  Cancer screening and Immunization. Refuses mammogram and delaying flu vaccine Questions or concerns regarding consultations or procedures which the PT has had in the interim are  addressed. The PT has been taking no medications, has ot been testing blood sugar and feels as though "all is well" Denies polyuria, polydipsia, blurred vision , or hypoglycemic episodes.   ROS Denies recent fever or chills. Denies sinus pressure, nasal congestion, ear pain or sore throat. Denies chest congestion, productive cough or wheezing. Denies chest pains, palpitations and leg swelling Denies abdominal pain, nausea, vomiting,diarrhea or constipation.   Denies dysuria, frequency, hesitancy or incontinence. Denies joint pain, swelling and limitation in mobility. Denies headaches, seizures, numbness, or tingling. Denies depression, anxiety or insomnia. Denies skin break down or rash.   PE  BP 118/62 (BP Location: Left Arm, Patient Position: Sitting, Cuff Size: Normal)   Pulse 87   Temp 98.4 F (36.9 C) (Other (Comment))   Resp (!) 96   Ht 5\' 4"  (1.626 m)   Wt 177 lb 4 oz (80.4 kg)   SpO2 96%   BMI 30.42 kg/m   Patient alert and oriented and in no cardiopulmonary distress.  HEENT: No facial asymmetry, EOMI,   oropharynx pink and moist.  Neck supple no JVD, no mass.  Chest: Clear to auscultation bilaterally.  CVS: S1, S2 no murmurs, no S3.Regular rate.  ABD: Soft non tender.   Ext: No edema  MS: Adequate ROM spine, shoulders, hips and knees.  Skin: Intact, no ulcerations or rash noted.  Psych: Good eye contact, normal affect. Memory intact not anxious or depressed appearing.  CNS: CN 2-12 intact, power,  normal  throughout.no focal deficits noted.   Assessment & Plan  Non compliance with medical treatment Ongoing challenge, off medication for months, and hBA1c over 10 Abnormal mammogram for years refusing f/u  Re education attempted, states she will take diabetic medications   Type II diabetes mellitus ([place]) Uncontrolled Pt to start medicatio [name] is reminded of the importance of commitment to daily physical activity for 30 minutes or more, as able and the need to limit carbohydrate intake to 30 to 60 grams per meal to help with blood sugar control.   The need to take medication as prescribed, test blood sugar as directed, and to call between visits if there is a concern that blood sugar is uncontrolled is also discussed.   [name] is reminded of the importance of daily foot exam, annual eye examination, and good blood sugar, blood pressure and cholesterol control.  Diabetic Labs Latest Ref Rng & Units 07/19/2017 02/18/2017 10/09/2016 09/13/2016 06/12/2016  HbA1c <5.7 % 14.0(H) 11.1(H) - 9.4(H) 12.8(H)  Microalbumin Not Estab. ug/mL - - 23.9(H) - -  Micro/Creat Ratio 0.0 - 30.0 mg/g creat - - 28.7 - -  Chol <200 mg/dL 175 - - 190 193  HDL >50 mg/dL 35(L) - - 48 32(L)  Calc LDL <100 mg/dL 118(H) - - 118 108  Triglycerides <150 mg/dL 108 - - 121 265(H)  Creatinine 0.50 - 0.99 mg/dL 0.67 0.67 - 0.60 0.73   BP/Weight 07/24/2017 06/17/2017 11/19/2016 10/09/2016 09/13/2016 06/12/2016 1/60/1093  Systolic BP 235 573 220 254 270 623 762  Diastolic  BP 62 78 72 80 60 68 78  Wt. (Lbs) 177.25 175.04 181 182.12 177.04 176 188  BMI 30.42 30.05 31.07 31.26 30.39 30.2 32.25   Foot/eye exam completion dates Latest Ref Rng & Units 10/09/2016 08/15/2015  Eye Exam No Retinopathy - -  Foot exam Order - - -  Foot Form Completion - Done Done      rept lab in 3 months  Hyperlipidemia Hyperlipidemia:Low fat diet discussed and encouraged.   Lipid Panel  Lab Results  Component Value Date   CHOL 175  07/19/2017   HDL 35 (L) 07/19/2017   LDLCALC 118 (H) 07/19/2017   TRIG 108 07/19/2017   CHOLHDL 5.0 (H) 07/19/2017   High CV risk Follow low fat diet Start crestor 10 mg daily    Obesity Deteriorated. Patient re-educated about  the importance of commitment to a  minimum of 150 minutes of exercise per week.  The importance of healthy food choices with portion control discussed. Encouraged to start a food diary, count calories and to consider  joining a support group. Sample diet sheets offered. Goals set by the patient for the next several months.   Weight /BMI 07/24/2017 06/17/2017 11/19/2016  WEIGHT 177 lb 4 oz 175 lb 0.6 oz 181 lb  HEIGHT 5\' 4"  5\' 4"  5\' 4"   BMI 30.42 kg/m2 30.05 kg/m2 31.07 kg/m2

## 2017-07-27 NOTE — Assessment & Plan Note (Signed)
Deteriorated. Patient re-educated about  the importance of commitment to a  minimum of 150 minutes of exercise per week.  The importance of healthy food choices with portion control discussed. Encouraged to start a food diary, count calories and to consider  joining a support group. Sample diet sheets offered. Goals set by the patient for the next several months.   Weight /BMI 07/24/2017 06/17/2017 11/19/2016  WEIGHT 177 lb 4 oz 175 lb 0.6 oz 181 lb  HEIGHT 5\' 4"  5\' 4"  5\' 4"   BMI 30.42 kg/m2 30.05 kg/m2 31.07 kg/m2

## 2017-07-27 NOTE — Assessment & Plan Note (Signed)
Uncontrolled Pt to start medicatio [name] is reminded of the importance of commitment to daily physical activity for 30 minutes or more, as able and the need to limit carbohydrate intake to 30 to 60 grams per meal to help with blood sugar control.   The need to take medication as prescribed, test blood sugar as directed, and to call between visits if there is a concern that blood sugar is uncontrolled is also discussed.   [name] is reminded of the importance of daily foot exam, annual eye examination, and good blood sugar, blood pressure and cholesterol control.  Diabetic Labs Latest Ref Rng & Units 07/19/2017 02/18/2017 10/09/2016 09/13/2016 06/12/2016  HbA1c <5.7 % 14.0(H) 11.1(H) - 9.4(H) 12.8(H)  Microalbumin Not Estab. ug/mL - - 23.9(H) - -  Micro/Creat Ratio 0.0 - 30.0 mg/g creat - - 28.7 - -  Chol <200 mg/dL 175 - - 190 193  HDL >50 mg/dL 35(L) - - 48 32(L)  Calc LDL <100 mg/dL 118(H) - - 118 108  Triglycerides <150 mg/dL 108 - - 121 265(H)  Creatinine 0.50 - 0.99 mg/dL 0.67 0.67 - 0.60 0.73   BP/Weight 07/24/2017 06/17/2017 11/19/2016 10/09/2016 09/13/2016 06/12/2016 5/95/6387  Systolic BP 564 332 951 884 166 063 016  Diastolic BP 62 78 72 80 60 68 78  Wt. (Lbs) 177.25 175.04 181 182.12 177.04 176 188  BMI 30.42 30.05 31.07 31.26 30.39 30.2 32.25   Foot/eye exam completion dates Latest Ref Rng & Units 10/09/2016 08/15/2015  Eye Exam No Retinopathy - -  Foot exam Order - - -  Foot Form Completion - Done Done      rept lab in 3 months

## 2017-07-27 NOTE — Assessment & Plan Note (Signed)
Hyperlipidemia:Low fat diet discussed and encouraged.   Lipid Panel  Lab Results  Component Value Date   CHOL 175 07/19/2017   HDL 35 (L) 07/19/2017   LDLCALC 118 (H) 07/19/2017   TRIG 108 07/19/2017   CHOLHDL 5.0 (H) 07/19/2017   High CV risk Follow low fat diet Start crestor 10 mg daily

## 2017-07-27 NOTE — Assessment & Plan Note (Signed)
Ongoing challenge, off medication for months, and hBA1c over 10 Abnormal mammogram for years refusing f/u  Re education attempted, states she will take diabetic medications

## 2017-07-27 NOTE — Assessment & Plan Note (Signed)
" >>  ASSESSMENT AND PLAN FOR TYPE II DIABETES MELLITUS ([facility]) WRITTEN ON 07/27/2017 12:13 PM BY Elazar Argabright E, MD  Uncontrolled Pt to start medicatio [name] is reminded of the importance of commitment to daily physical activity for 30 minutes or more, as able and the need to limit carbohydrate intake to 30 to 60 grams per meal to help with blood sugar control.   The need to take medication as prescribed, test blood sugar as directed, and to call between visits if there is a concern that blood sugar is uncontrolled is also discussed.   [name] is reminded of the importance of daily foot exam, annual eye examination, and good blood sugar, blood pressure and cholesterol control.  Diabetic Labs Latest Ref Rng & Units 07/19/2017 02/18/2017 10/09/2016 09/13/2016 06/12/2016  HbA1c <5.7 % 14.0(H) 11.1(H) - 9.4(H) 12.8(H)  Microalbumin Not Estab. ug/mL - - 23.9(H) - -  Micro/Creat Ratio 0.0 - 30.0 mg/g creat - - 28.7 - -  Chol <200 mg/dL 824 - - 809 806  HDL >49 mg/dL 64(O) - - 48 67(O)  Calc LDL <100 mg/dL 881(Y) - - 881 891  Triglycerides <150 mg/dL 891 - - 878 734(Y)  Creatinine 0.50 - 0.99 mg/dL 9.32 9.32 - 9.39 9.26   BP/Weight 07/24/2017 06/17/2017 11/19/2016 10/09/2016 09/13/2016 06/12/2016 12/22/2015  Systolic BP 118 124 123 122 104 110 128  Diastolic BP 62 78 72 80 60 68 78  Wt. (Lbs) 177.25 175.04 181 182.12 177.04 176 188  BMI 30.42 30.05 31.07 31.26 30.39 30.2 32.25   Foot/eye exam completion dates Latest Ref Rng & Units 10/09/2016 08/15/2015  Eye Exam No Retinopathy - -  Foot exam Order - - -  Foot Form Completion - Done Done      rept lab in 3 months "

## 2017-10-28 DIAGNOSIS — E782 Mixed hyperlipidemia: Secondary | ICD-10-CM | POA: Diagnosis not present

## 2017-10-28 DIAGNOSIS — I10 Essential (primary) hypertension: Secondary | ICD-10-CM | POA: Diagnosis not present

## 2017-10-28 DIAGNOSIS — E118 Type 2 diabetes mellitus with unspecified complications: Secondary | ICD-10-CM | POA: Diagnosis not present

## 2017-10-29 ENCOUNTER — Ambulatory Visit (INDEPENDENT_AMBULATORY_CARE_PROVIDER_SITE_OTHER): Payer: Medicare HMO | Admitting: Family Medicine

## 2017-10-29 ENCOUNTER — Encounter: Payer: Self-pay | Admitting: Family Medicine

## 2017-10-29 VITALS — BP 118/78 | HR 82 | Resp 16 | Ht 64.0 in | Wt 169.0 lb

## 2017-10-29 DIAGNOSIS — E782 Mixed hyperlipidemia: Secondary | ICD-10-CM | POA: Diagnosis not present

## 2017-10-29 DIAGNOSIS — Z0000 Encounter for general adult medical examination without abnormal findings: Secondary | ICD-10-CM | POA: Diagnosis not present

## 2017-10-29 DIAGNOSIS — Z23 Encounter for immunization: Secondary | ICD-10-CM

## 2017-10-29 DIAGNOSIS — Z9119 Patient's noncompliance with other medical treatment and regimen: Secondary | ICD-10-CM | POA: Diagnosis not present

## 2017-10-29 DIAGNOSIS — E118 Type 2 diabetes mellitus with unspecified complications: Secondary | ICD-10-CM

## 2017-10-29 DIAGNOSIS — Z91199 Patient's noncompliance with other medical treatment and regimen due to unspecified reason: Secondary | ICD-10-CM

## 2017-10-29 DIAGNOSIS — Z1211 Encounter for screening for malignant neoplasm of colon: Secondary | ICD-10-CM | POA: Diagnosis not present

## 2017-10-29 LAB — LIPID PANEL
CHOLESTEROL: 198 mg/dL (ref <200)
HDL: 33 mg/dL — ABNORMAL LOW (ref >50)
LDL Cholesterol (Calc): 131 mg/dL (calc) — ABNORMAL HIGH
Non-HDL Cholesterol (Calc): 165 mg/dL (calc) — ABNORMAL HIGH (ref <130)
TRIGLYCERIDES: 206 mg/dL — AB (ref <150)
Total CHOL/HDL Ratio: 6 (calc) — ABNORMAL HIGH (ref <5.0)

## 2017-10-29 LAB — COMPLETE METABOLIC PANEL WITH GFR
AG RATIO: 1.1 (calc) (ref 1.0–2.5)
ALT: 20 U/L (ref 6–29)
AST: 18 U/L (ref 10–35)
Albumin: 4.1 g/dL (ref 3.6–5.1)
Alkaline phosphatase (APISO): 101 U/L (ref 33–130)
BUN: 17 mg/dL (ref 7–25)
CALCIUM: 9.1 mg/dL (ref 8.6–10.4)
CO2: 29 mmol/L (ref 20–32)
CREATININE: 0.56 mg/dL (ref 0.50–0.99)
Chloride: 101 mmol/L (ref 98–110)
GFR, EST AFRICAN AMERICAN: 116 mL/min/{1.73_m2} (ref >=60)
GFR, EST NON AFRICAN AMERICAN: 100 mL/min/{1.73_m2} (ref >=60)
GLOBULIN: 3.6 g/dL (ref 1.9–3.7)
Glucose, Bld: 367 mg/dL — ABNORMAL HIGH (ref 65–99)
POTASSIUM: 4.2 mmol/L (ref 3.5–5.3)
SODIUM: 137 mmol/L (ref 135–146)
Total Bilirubin: 0.3 mg/dL (ref 0.2–1.2)
Total Protein: 7.7 g/dL (ref 6.1–8.1)

## 2017-10-29 LAB — HEMOGLOBIN A1C
HEMOGLOBIN A1C: 13.6 %{Hb} — AB (ref <5.7)
Mean Plasma Glucose: 344 (calc)
eAG (mmol/L): 19 (calc)

## 2017-10-29 LAB — POC HEMOCCULT BLD/STL (OFFICE/1-CARD/DIAGNOSTIC): FECAL OCCULT BLD: NEGATIVE

## 2017-10-29 NOTE — Patient Instructions (Addendum)
F/u in 3 months, call if you need me before  NEED to take your medication prescribed, blood sugar and cholesterol are too high  Goal for fasting blood sugar ranges from 80 to 120 and 2 hours after any meal or at bedtime should be between 130 to 170.   This is increases your risk of heart attack, stroke and kidney failure and loss of limb  Microalb today  Fasting lipid, cmpa and EGFR and HBa1C 1 wqeek before next visit  It is important that you exercise regularly at least 30 minutes 5 times a week. If you develop chest pain, have severe difficulty breathing, or feel very tired, stop exercising immediately and seek medical attention  Thank you  for choosing [facility]. We consider it a privelige to serve you.  Delivering excellent health care in a caring and  compassionate way is our goal.  Partnering with you,  so that together we can achieve this goal is our strategy.

## 2017-10-30 DIAGNOSIS — E118 Type 2 diabetes mellitus with unspecified complications: Secondary | ICD-10-CM | POA: Diagnosis not present

## 2017-10-31 LAB — MICROALBUMIN / CREATININE URINE RATIO
CREATININE, URINE: 108 mg/dL (ref 20–275)
MICROALB UR: 3.9 mg/dL
MICROALB/CREAT RATIO: 36 ug/mg{creat} — AB (ref <30)

## 2017-11-01 ENCOUNTER — Encounter: Payer: Self-pay | Admitting: Family Medicine

## 2017-11-01 NOTE — Assessment & Plan Note (Signed)
" >>  ASSESSMENT AND PLAN FOR TYPE II DIABETES MELLITUS ([facility]) WRITTEN ON 11/01/2017  6:51 AM BY Preslyn Warr E, MD  Uncontrolled due to medical non compliance as far as medication and dietary adherence as well as testing [name] is reminded of the importance of commitment to daily physical activity for 30 minutes or more, as able and the need to limit carbohydrate intake to 30 to 60 grams per meal to help with blood sugar control.   The need to take medication as prescribed, test blood sugar as directed, and to call between visits if there is a concern that blood sugar is uncontrolled is also discussed.   [name] is reminded of the importance of daily foot exam, annual eye examination, and good blood sugar, blood pressure and cholesterol control.  Diabetic Labs Latest Ref Rng & Units 10/30/2017 10/28/2017 07/19/2017 02/18/2017 10/09/2016  HbA1c <5.7 % of total Hgb - 13.6(H) 14.0(H) 11.1(H) -  Microalbumin mg/dL 3.9 - - - 23.9(H)  Micro/Creat Ratio <30 mcg/mg creat 36(H) - - - 28.7  Chol <200 mg/dL - 801 824 - -  HDL >49 mg/dL - 66(O) 64(O) - -  Calc LDL <100 mg/dL - - 881(Y) - -  Triglycerides <150 mg/dL - 793(Y) 891 - -  Creatinine 0.50 - 0.99 mg/dL - 9.43 9.32 9.32 -   BP/Weight 10/29/2017 07/24/2017 06/17/2017 11/19/2016 10/09/2016 09/13/2016 06/12/2016  Systolic BP 118 118 124 123 122 104 110  Diastolic BP 78 62 78 72 80 60 68  Wt. (Lbs) 169 177.25 175.04 181 182.12 177.04 176  BMI 29.01 30.42 30.05 31.07 31.26 30.39 30.2   Foot/eye exam completion dates Latest Ref Rng & Units 10/29/2017 10/09/2016  Eye Exam No Retinopathy - -  Foot exam Order - - -  Foot Form Completion - Done Done       "

## 2017-11-01 NOTE — Assessment & Plan Note (Signed)
Hyperlipidemia:Low fat diet discussed and encouraged.   Lipid Panel  Lab Results  Component Value Date   CHOL 198 10/28/2017   HDL 33 (L) 10/28/2017   LDLCALC 118 (H) 07/19/2017   TRIG 206 (H) 10/28/2017   CHOLHDL 6.0 (H) 10/28/2017  uncontrolled due to medical non compliance , re educated re the need to

## 2017-11-01 NOTE — Assessment & Plan Note (Signed)
Annual exam as documented. Counseling done  re healthy lifestyle involving commitment to 150 minutes exercise per week, heart healthy diet, and attaining healthy weight.The importance of adequate sleep also discussed. Changes in health habits are decided on by the patient with goals and time frames  set for achieving them. Immunization and cancer screening needs are specifically addressed at this visit. 

## 2017-11-01 NOTE — Assessment & Plan Note (Signed)
Ongoing challenge, presenting with persistently uncontrolled diabetes and hyperlipidemia. Has refused to have further testing on breast which was recommended some time ago, as there is a high suspicion that she has breast cancer Effort again made to engage patient in following through with medial recommendations as far as health decisions are concerned

## 2017-11-01 NOTE — Assessment & Plan Note (Addendum)
Uncontrolled due to medical non compliance as far as medication and dietary adherence as well as testing [name] is reminded of the importance of commitment to daily physical activity for 30 minutes or more, as able and the need to limit carbohydrate intake to 30 to 60 grams per meal to help with blood sugar control.   The need to take medication as prescribed, test blood sugar as directed, and to call between visits if there is a concern that blood sugar is uncontrolled is also discussed.   [name] is reminded of the importance of daily foot exam, annual eye examination, and good blood sugar, blood pressure and cholesterol control.  Diabetic Labs Latest Ref Rng & Units 10/30/2017 10/28/2017 07/19/2017 02/18/2017 10/09/2016  HbA1c <5.7 % of total Hgb - 13.6(H) 14.0(H) 11.1(H) -  Microalbumin mg/dL 3.9 - - - 23.9(H)  Micro/Creat Ratio <30 mcg/mg creat 36(H) - - - 28.7  Chol <200 mg/dL - 198 175 - -  HDL >50 mg/dL - 33(L) 35(L) - -  Calc LDL <100 mg/dL - - 118(H) - -  Triglycerides <150 mg/dL - 206(H) 108 - -  Creatinine 0.50 - 0.99 mg/dL - 0.56 0.67 0.67 -   BP/Weight 10/29/2017 07/24/2017 06/17/2017 11/19/2016 10/09/2016 09/13/2016 1/77/9390  Systolic BP 300 923 300 762 263 335 456  Diastolic BP 78 62 78 72 80 60 68  Wt. (Lbs) 169 177.25 175.04 181 182.12 177.04 176  BMI 29.01 30.42 30.05 31.07 31.26 30.39 30.2   Foot/eye exam completion dates Latest Ref Rng & Units 10/29/2017 10/09/2016  Eye Exam No Retinopathy - -  Foot exam Order - - -  Foot Form Completion - Done Done

## 2017-11-01 NOTE — Progress Notes (Signed)
[name]     MRN: [id number]      DOB: 18-Feb-1956  HPI: Patient is in for annual physical exam. No other health concerns are expressed or addressed at the visit. Recent labs, reviewed. Immunization is reviewed , and  updated    PE: BP 118/78   Pulse 82   Resp 16   Ht 5\' 4"  (1.626 m)   Wt 169 lb (76.7 kg)   SpO2 94%   BMI 29.01 kg/m   Pleasant  female, alert and oriented x 3, in no cardio-pulmonary distress. Afebrile. HEENT No facial trauma or asymetry. Sinuses non tender.  Extra occullar muscles intact, pupils equally reactive to light. External ears normal, tympanic membranes clear. Oropharynx moist, no exudate. Neck: supple, no adenopathy,JVD or thyromegaly.No bruits.  Chest: Clear to ascultation bilaterally.No crackles or wheezes. Non tender to palpation  Breast: No asymetry,no masses or lumps. No tenderness. No nipple discharge or inversion. No axillary or supraclavicular adenopathy  Cardiovascular system; Heart sounds normal,  S1 and  S2 ,no S3.  No murmur, or thrill. Apical beat not displaced Peripheral pulses normal.  Abdomen: Soft, non tender, no organomegaly or masses. No bruits. Bowel sounds normal. No guarding, tenderness or rebound.  Rectal:  Normal sphincter tone. No rectal mass. Guaiac negative stool.  GU: Not examined   Musculoskeletal exam: Full ROM of spine, hips , shoulders and knees. No deformity ,swelling or crepitus noted. No muscle wasting or atrophy.   Neurologic: Cranial nerves 2 to 12 intact. Power, tone ,sensation and reflexes normal throughout. No disturbance in gait. No tremor.  Skin: Intact, no ulceration, erythema , scaling or rash noted. Pigmentation normal throughout  Psych; Normal mood and affect. Judgement and concentration normal   Assessment & Plan:  Annual physical exam Annual exam as documented. Counseling done  re healthy lifestyle involving commitment to 150 minutes exercise per week, heart  healthy diet, and attaining healthy weight.The importance of adequate sleep also discussed.  Changes in health habits are decided on by the patient with goals and time frames  set for achieving them. Immunization and cancer screening needs are specifically addressed at this visit.   Type II diabetes mellitus ([place]) Uncontrolled due to medical non compliance as far as medication and dietary adherence as well as testing [name] is reminded of the importance of commitment to daily physical activity for 30 minutes or more, as able and the need to limit carbohydrate intake to 30 to 60 grams per meal to help with blood sugar control.   The need to take medication as prescribed, test blood sugar as directed, and to call between visits if there is a concern that blood sugar is uncontrolled is also discussed.   [name] is reminded of the importance of daily foot exam, annual eye examination, and good blood sugar, blood pressure and cholesterol control.  Diabetic Labs Latest Ref Rng & Units 10/30/2017 10/28/2017 07/19/2017 02/18/2017 10/09/2016  HbA1c <5.7 % of total Hgb - 13.6(H) 14.0(H) 11.1(H) -  Microalbumin mg/dL 3.9 - - - 23.9(H)  Micro/Creat Ratio <30 mcg/mg creat 36(H) - - - 28.7  Chol <200 mg/dL - 198 175 - -  HDL >50 mg/dL - 33(L) 35(L) - -  Calc LDL <100 mg/dL - - 118(H) - -  Triglycerides <150 mg/dL - 206(H) 108 - -  Creatinine 0.50 - 0.99 mg/dL - 0.56 0.67 0.67 -   BP/Weight 10/29/2017 07/24/2017 06/17/2017 11/19/2016 10/09/2016 09/13/2016 4/31/5400  Systolic BP 867 619 509  123 336 122 449  Diastolic BP 78 62 78 72 80 60 68  Wt. (Lbs) 169 177.25 175.04 181 182.12 177.04 176  BMI 29.01 30.42 30.05 31.07 31.26 30.39 30.2   Foot/eye exam completion dates Latest Ref Rng & Units 10/29/2017 10/09/2016  Eye Exam No Retinopathy - -  Foot exam Order - - -  Foot Form Completion - Done Done        Hyperlipidemia Hyperlipidemia:Low fat diet discussed and encouraged.   Lipid Panel  Lab  Results  Component Value Date   CHOL 198 10/28/2017   HDL 33 (L) 10/28/2017   LDLCALC 118 (H) 07/19/2017   TRIG 206 (H) 10/28/2017   CHOLHDL 6.0 (H) 10/28/2017  uncontrolled due to medical non compliance , re educated re the need to     Non compliance with medical treatment Ongoing challenge, presenting with persistently uncontrolled diabetes and hyperlipidemia. Has refused to have further testing on breast which was recommended some time ago, as there is a high suspicion that she has breast cancer Effort again made to engage patient in following through with medial recommendations as far as health decisions are concerned

## 2017-11-26 NOTE — Telephone Encounter (Signed)
No notes for this encounter

## 2018-01-29 ENCOUNTER — Ambulatory Visit: Payer: Medicare HMO | Admitting: Family Medicine

## 2018-06-23 ENCOUNTER — Ambulatory Visit: Payer: Self-pay

## 2018-06-23 ENCOUNTER — Ambulatory Visit: Payer: Medicare HMO

## 2018-07-01 ENCOUNTER — Ambulatory Visit: Payer: Self-pay

## 2018-07-03 ENCOUNTER — Encounter: Payer: Self-pay | Admitting: Family Medicine

## 2018-12-19 DIAGNOSIS — E079 Disorder of thyroid, unspecified: Secondary | ICD-10-CM | POA: Diagnosis not present

## 2018-12-19 DIAGNOSIS — N390 Urinary tract infection, site not specified: Secondary | ICD-10-CM | POA: Diagnosis not present

## 2018-12-19 DIAGNOSIS — R799 Abnormal finding of blood chemistry, unspecified: Secondary | ICD-10-CM | POA: Diagnosis not present

## 2018-12-19 DIAGNOSIS — Z131 Encounter for screening for diabetes mellitus: Secondary | ICD-10-CM | POA: Diagnosis not present

## 2019-03-10 ENCOUNTER — Ambulatory Visit (INDEPENDENT_AMBULATORY_CARE_PROVIDER_SITE_OTHER): Payer: Self-pay | Admitting: Family Medicine

## 2019-03-10 ENCOUNTER — Encounter: Payer: Self-pay | Admitting: Family Medicine

## 2019-03-10 ENCOUNTER — Other Ambulatory Visit: Payer: Self-pay

## 2019-03-10 DIAGNOSIS — Z0000 Encounter for general adult medical examination without abnormal findings: Secondary | ICD-10-CM

## 2019-03-10 NOTE — Progress Notes (Addendum)
Subjective:   [name] is a 63 y.o. female who presents for Medicare Annual (Subsequent) preventive examination.  Location of Patient: Home Location of Provider: Telehealth Consent was obtain for visit to be over via telehealth. A telephone visit via telemedicine application was used and I verified that I am speaking with the correct person using two identifiers.  Review of Systems:  Review of Systems  Constitutional: Negative.   HENT: Negative.   Eyes: Negative.   Respiratory: Negative.   Cardiovascular: Negative.   Gastrointestinal: Negative.   Genitourinary: Negative.   Musculoskeletal: Negative.   Skin: Negative.   Neurological: Negative.   Endo/Heme/Allergies: Negative.   Psychiatric/Behavioral: Negative.   All other systems reviewed and are negative.         Objective:     Vitals: There were no vitals taken for this visit.  There is no height or weight on file to calculate BMI.  Advanced Directives 06/17/2017 04/14/2015  Does Patient Have a Medical Advance Directive? Yes No  Type of Advance Directive Living will;Healthcare Power of Attorney -  Does patient want to make changes to medical advance directive? No - Patient declined -  Copy of [place] in Chart? Yes -    Tobacco Social History   Tobacco Use  Smoking Status Never Smoker  Smokeless Tobacco Never Used     Counseling given: Not Answered   Clinical Intake:                       Past Medical History:  Diagnosis Date   Allergic rhinitis    Anemia, deficiency    Diabetes mellitus    GERD (gastroesophageal reflux disease)    Hyperlipidemia    Hypertension    Obesity    Past Surgical History:  Procedure Laterality Date   EXTRACTION OF SEVENTH TEETH  2007   REMOVAL OF GREAT TOENAIL  2005   SPLENECTOMY  2002   Family History  Problem Relation Age of Onset   Diabetes Mother    Hypertension Mother    Hyperlipidemia Mother    Lung  disease Father    Hypertension Sister    Hyperlipidemia Sister    Hypertension Brother    Sickle cell trait Son    Arthritis Unknown    Asthma Unknown    Cancer Unknown    Social History   Socioeconomic History   Marital status: Divorced    Spouse name: Not on file   Number of children: 1   Years of education: Not on file   Highest education level: Not on file  Occupational History   Occupation: DISABLED   Social Designer, fashion/clothing strain: Not on file   Food insecurity:    Worry: Not on file    Inability: Not on file   Transportation needs:    Medical: Not on file    Non-medical: Not on file  Tobacco Use   Smoking status: Never Smoker   Smokeless tobacco: Never Used  Substance and Sexual Activity   Alcohol use: No   Drug use: No   Sexual activity: Yes    Birth control/protection: Post-menopausal  Lifestyle   Physical activity:    Days per week: Not on file    Minutes per session: Not on file   Stress: Not on file  Relationships   Social connections:    Talks on phone: Not on file    Gets together: Not on file  Attends religious service: Not on file    Active member of club or organization: Not on file    Attends meetings of clubs or organizations: Not on file    Relationship status: Not on file  Other Topics Concern   Not on file  Social History Narrative   Not on file    Outpatient Encounter Medications as of 03/10/2019  Medication Sig   ACCU-CHEK AVIVA PLUS test strip USE TO TEST SUGAR DAILY   aspirin 81 MG tablet Take 81 mg by mouth daily.     Blood Glucose Monitoring Suppl (BAYER CONTOUR MONITOR) W/DEVICE KIT by Does not apply route daily.     Calcium-Vitamin D (SUPER CALCIUM/D) 600-125 MG-UNIT TABS Take 1 tablet by mouth daily.    Cod Liver Oil CAPS Take 1 capsule by mouth daily.    Echinacea-Goldenseal (ECHINACEA COMB/GOLDEN SEAL PO) Take by mouth.   Garlic Oil 662 MG CAPS Take 1 capsule by mouth daily.     glipiZIDE (GLUCOTROL XL) 10 MG 24 hr tablet Take 1 tablet (10 mg total) by mouth daily with breakfast.   Multiple Vitamins-Minerals (MULTIPLE VITAMINS/WOMENS) tablet Take 1 tablet by mouth daily.     rosuvastatin (CRESTOR) 10 MG tablet Take 1 tablet (10 mg total) by mouth daily.   SURE COMFORT LANCETS 30G MISC TEST BLOOD SUGAR ONCE DAILY   Turmeric 500 MG TABS Take 2 tablets by mouth 3 (three) times daily.   No facility-administered encounter medications on file as of 03/10/2019.     Activities of Daily Living No flowsheet data found.  Patient Care Team: [name], MD as PCP - General Carole Civil, MD as Consulting Physician (Orthopedic Surgery)    Assessment:   This is a routine wellness examination for [place].  Exercise Activities and Dietary recommendations    Goals   None     Fall Risk Fall Risk  03/10/2019 06/17/2017 06/12/2016 04/14/2015  Falls in the past year? 0 No No No  Number falls in past yr: 0 - - -  Injury with Fall? 0 - - -   Is the patient's home free of loose throw rugs in walkways, pet beds, electrical cords, etc?   yes      Grab bars in the bathroom? no      Handrails on the stairs?   no      Adequate lighting?   yes  Timed Get Up and Go performed: unable to assess  Depression Screen PHQ 2/9 Scores 03/10/2019 06/17/2017 06/12/2016 04/14/2015  PHQ - 2 Score 0 0 0 0  PHQ- 9 Score - 0 - -     Cognitive Function     6CIT Screen 06/17/2017  What Year? 0 points  What month? 0 points  What time? 0 points  Count back from 20 0 points  Months in reverse 0 points  Repeat phrase 0 points  Total Score 0    Immunization History  Administered Date(s) Administered   H1N1 12/28/2008   Influenza Split 10/01/2011, 10/09/2012   Influenza Whole 08/13/2007, 09/06/2008, 08/24/2009, 11/21/2010   Influenza,inj,Quad PF,6+ Mos 09/10/2013, 08/11/2014, 08/02/2015, 09/13/2016, 10/29/2017   PPD Test 06/09/2013   Pneumococcal Conjugate-13  04/14/2015   Pneumococcal Polysaccharide-23 10/25/2004, 09/10/2013   Td 12/04/1995, 08/24/2009   Tdap 12/25/2012    Qualifies for Shingles Vaccine? Yes, but declines  Screening Tests Health Maintenance  Topic Date Due   OPHTHALMOLOGY EXAM  01/25/2016   COLONOSCOPY  01/04/2018   HEMOGLOBIN A1C  04/27/2018  PAP SMEAR-Modifier  08/14/2018   URINE MICROALBUMIN  10/30/2018   FOOT EXAM  11/01/2018   MAMMOGRAM  06/18/2019 (Originally 01/14/2015)   INFLUENZA VACCINE  07/04/2019   TETANUS/TDAP  12/25/2022   Hepatitis C Screening  Completed   HIV Screening  Completed    Cancer Screenings: Lung: Low Dose CT Chest recommended if Age 69-80 years, 30 pack-year currently smoking OR have quit w/in 15years. Patient does not qualify. Breast:  Up to date on Mammogram? No   Up to date of Bone Density/Dexa? No Colorectal: no   Additional Screenings:  Hepatitis C Screening: negative 2016     Plan:  She is currently refusing all screening. Advised her to talk with Dr Moshe Cipro about this at her next appt.   I have personally reviewed and noted the following in the patients chart:    Medical and social history  Use of alcohol, tobacco or illicit drugs   Current medications and supplements  Functional ability and status  Nutritional status  Physical activity  Advanced directives  List of other physicians  Hospitalizations, surgeries, and ER visits in previous 12 months  Vitals  Screenings to include cognitive, depression, and falls  Referrals and appointments  In addition, I have reviewed and discussed with patient certain preventive protocols, quality metrics, and best practice recommendations. A written personalized care plan for preventive services as well as general preventive health recommendations were provided to patient.   I provided 25 minutes of non-face-to-face time during this encounter.   Perlie Mayo, NP 03/10/2019

## 2019-03-10 NOTE — Patient Instructions (Signed)
[name] , Thank you for taking time to come for your Medicare Wellness Visit. I appreciate your ongoing commitment to your health goals. Please review the following plan we discussed and let me know if I can assist you in the future.   Screening recommendations/referrals: Please talk with Dr Moshe Cipro about your screenings.  Colonoscopy: due Mammogram: due Bone Density: due Recommended yearly ophthalmology/optometry visit for glaucoma screening and checkup Recommended yearly dental visit for hygiene and checkup  Advanced directives: Living will-need a copy when you can.  Preventive Care 40-64 Years, Female Preventive care refers to lifestyle choices and visits with your health care provider that can promote health and wellness. What does preventive care include?  A yearly physical exam. This is also called an annual well check.  Dental exams once or twice a year.  Routine eye exams. Ask your health care provider how often you should have your eyes checked.  Personal lifestyle choices, including:  Daily care of your teeth and gums.  Regular physical activity.  Eating a healthy diet.  Avoiding tobacco and drug use.  Limiting alcohol use.  Practicing safe sex.  Taking low-dose aspirin daily starting at age 81.  Taking vitamin and mineral supplements as recommended by your health care provider. What happens during an annual well check? The services and screenings done by your health care provider during your annual well check will depend on your age, overall health, lifestyle risk factors, and family history of disease. Counseling  Your health care provider may ask you questions about your:  Alcohol use.  Tobacco use.  Drug use.  Emotional well-being.  Home and relationship well-being.  Sexual activity.  Eating habits.  Work and work Statistician.  Method of birth control.  Menstrual cycle.  Pregnancy history. Screening  You may have the following tests or  measurements:  Height, weight, and BMI.  Blood pressure.  Lipid and cholesterol levels. These may be checked every 5 years, or more frequently if you are over 21 years old.  Skin check.  Lung cancer screening. You may have this screening every year starting at age 81 if you have a 30-pack-year history of smoking and currently smoke or have quit within the past 15 years.  Fecal occult blood test (FOBT) of the stool. You may have this test every year starting at age 90.  Flexible sigmoidoscopy or colonoscopy. You may have a sigmoidoscopy every 5 years or a colonoscopy every 10 years starting at age 80.  Hepatitis C blood test.  Hepatitis B blood test.  Sexually transmitted disease (STD) testing.  Diabetes screening. This is done by checking your blood sugar (glucose) after you have not eaten for a while (fasting). You may have this done every 1-3 years.  Mammogram. This may be done every 1-2 years. Talk to your health care provider about when you should start having regular mammograms. This may depend on whether you have a family history of breast cancer.  BRCA-related cancer screening. This may be done if you have a family history of breast, ovarian, tubal, or peritoneal cancers.  Pelvic exam and Pap test. This may be done every 3 years starting at age 62. Starting at age 23, this may be done every 5 years if you have a Pap test in combination with an HPV test.  Bone density scan. This is done to screen for osteoporosis. You may have this scan if you are at high risk for osteoporosis. Discuss your test results, treatment options, and if necessary,  the need for more tests with your health care provider. Vaccines  Your health care provider may recommend certain vaccines, such as:  Influenza vaccine. This is recommended every year.  Tetanus, diphtheria, and acellular pertussis (Tdap, Td) vaccine. You may need a Td booster every 10 years.  Zoster vaccine. You may need this after age  85.  Pneumococcal 13-valent conjugate (PCV13) vaccine. You may need this if you have certain conditions and were not previously vaccinated.  Pneumococcal polysaccharide (PPSV23) vaccine. You may need one or two doses if you smoke cigarettes or if you have certain conditions. Talk to your health care provider about which screenings and vaccines you need and how often you need them. This information is not intended to replace advice given to you by your health care provider. Make sure you discuss any questions you have with your health care provider. Document Released: 12/16/2015 Document Revised: 08/08/2016 Document Reviewed: 09/20/2015 Elsevier Interactive Patient Education  2017 [employer] Prevention in the Home Falls can cause injuries. They can happen to people of all ages. There are many things you can do to make your home safe and to help prevent falls. What can I do on the outside of my home?  Regularly fix the edges of walkways and driveways and fix any cracks.  Remove anything that might make you trip as you walk through a door, such as a raised step or threshold.  Trim any bushes or trees on the path to your home.  Use bright outdoor lighting.  Clear any walking paths of anything that might make someone trip, such as rocks or tools.  Regularly check to see if handrails are loose or broken. Make sure that both sides of any steps have handrails.  Any raised decks and porches should have guardrails on the edges.  Have any leaves, snow, or ice cleared regularly.  Use sand or salt on walking paths during winter.  Clean up any spills in your garage right away. This includes oil or grease spills. What can I do in the bathroom?  Use night lights.  Install grab bars by the toilet and in the tub and shower. Do not use towel bars as grab bars.  Use non-skid mats or decals in the tub or shower.  If you need to sit down in the shower, use a plastic, non-slip stool.   Keep the floor dry. Clean up any water that spills on the floor as soon as it happens.  Remove soap buildup in the tub or shower regularly.  Attach bath mats securely with double-sided non-slip rug tape.  Do not have throw rugs and other things on the floor that can make you trip. What can I do in the bedroom?  Use night lights.  Make sure that you have a light by your bed that is easy to reach.  Do not use any sheets or blankets that are too big for your bed. They should not hang down onto the floor.  Have a firm chair that has side arms. You can use this for support while you get dressed.  Do not have throw rugs and other things on the floor that can make you trip. What can I do in the kitchen?  Clean up any spills right away.  Avoid walking on wet floors.  Keep items that you use a lot in easy-to-reach places.  If you need to reach something above you, use a strong step stool that has a grab bar.  Keep electrical cords out of the way.  Do not use floor polish or wax that makes floors slippery. If you must use wax, use non-skid floor wax.  Do not have throw rugs and other things on the floor that can make you trip. What can I do with my stairs?  Do not leave any items on the stairs.  Make sure that there are handrails on both sides of the stairs and use them. Fix handrails that are broken or loose. Make sure that handrails are as long as the stairways.  Check any carpeting to make sure that it is firmly attached to the stairs. Fix any carpet that is loose or worn.  Avoid having throw rugs at the top or bottom of the stairs. If you do have throw rugs, attach them to the floor with carpet tape.  Make sure that you have a light switch at the top of the stairs and the bottom of the stairs. If you do not have them, ask someone to add them for you. What else can I do to help prevent falls?  Wear shoes that:  Do not have high heels.  Have rubber bottoms.  Are  comfortable and fit you well.  Are closed at the toe. Do not wear sandals.  If you use a stepladder:  Make sure that it is fully opened. Do not climb a closed stepladder.  Make sure that both sides of the stepladder are locked into place.  Ask someone to hold it for you, if possible.  Clearly mark and make sure that you can see:  Any grab bars or handrails.  First and last steps.  Where the edge of each step is.  Use tools that help you move around (mobility aids) if they are needed. These include:  Canes.  Walkers.  Scooters.  Crutches.  Turn on the lights when you go into a dark area. Replace any light bulbs as soon as they burn out.  Set up your furniture so you have a clear path. Avoid moving your furniture around.  If any of your floors are uneven, fix them.  If there are any pets around you, be aware of where they are.  Review your medicines with your doctor. Some medicines can make you feel dizzy. This can increase your chance of falling. Ask your doctor what other things that you can do to help prevent falls. This information is not intended to replace advice given to you by your health care provider. Make sure you discuss any questions you have with your health care provider. Document Released: 09/15/2009 Document Revised: 04/26/2016 Document Reviewed: 12/24/2014 Elsevier Interactive Patient Education  2017 [employer].

## 2019-04-30 ENCOUNTER — Other Ambulatory Visit: Payer: Self-pay

## 2019-04-30 ENCOUNTER — Encounter: Payer: Self-pay | Admitting: Family Medicine

## 2019-04-30 ENCOUNTER — Encounter (INDEPENDENT_AMBULATORY_CARE_PROVIDER_SITE_OTHER): Payer: Self-pay

## 2019-04-30 ENCOUNTER — Ambulatory Visit (INDEPENDENT_AMBULATORY_CARE_PROVIDER_SITE_OTHER): Payer: Self-pay | Admitting: Family Medicine

## 2019-04-30 VITALS — BP 102/67 | Ht 64.0 in | Wt 153.0 lb

## 2019-04-30 DIAGNOSIS — Z7189 Other specified counseling: Secondary | ICD-10-CM

## 2019-04-30 DIAGNOSIS — E559 Vitamin D deficiency, unspecified: Secondary | ICD-10-CM

## 2019-04-30 DIAGNOSIS — I10 Essential (primary) hypertension: Secondary | ICD-10-CM

## 2019-04-30 DIAGNOSIS — Z91199 Patient's noncompliance with other medical treatment and regimen due to unspecified reason: Secondary | ICD-10-CM

## 2019-04-30 DIAGNOSIS — E782 Mixed hyperlipidemia: Secondary | ICD-10-CM

## 2019-04-30 DIAGNOSIS — E1140 Type 2 diabetes mellitus with diabetic neuropathy, unspecified: Secondary | ICD-10-CM

## 2019-04-30 DIAGNOSIS — C50911 Malignant neoplasm of unspecified site of right female breast: Secondary | ICD-10-CM

## 2019-04-30 DIAGNOSIS — Z9119 Patient's noncompliance with other medical treatment and regimen: Secondary | ICD-10-CM

## 2019-04-30 NOTE — Patient Instructions (Addendum)
Physical with pap with MD Sept 13,   Please keep regular exercise and healthy food choice going  Please get fasting cBC, lipid, cmp and eGFR, hBA1C, tSH and vit D in the next 1 to 2 weeks ( [facility]), also microalb  Social distancing.Limit exposure to people you do not live with and maintain at least a 6 ft distance Frequent hand washing with soap and water Keeping your hands off of your face.Wear a face mask in public These 3 practices will help to keep both you and your community healthy during this time. Please practice them faithfully!  Thanks for choosing [facility], we consider it a privelige to serve you.

## 2019-04-30 NOTE — Progress Notes (Signed)
Virtual Visit via Telephone Note  I connected with [name] on 04/30/19 at  2:20 PM EDT by telephone and verified that I am speaking with the correct person using two identifiers.  Location: Patient:home Provider:office   I discussed the limitations, risks, security and privacy concerns of performing an evaluation and management service by telephone and the availability of in person appointments. I also discussed with the patient that there may be a patient responsible charge related to this service. The patient expressed understanding and agreed to proceed. This visit type is conducted due to national recommendations for restrictions regarding the COVID -19 Pandemic. Due to the patient's age and / or co morbidities, this format is felt to be most appropriate at this time without adequate follow up. The patient has no access to video technology/ had technical difficulties with video, requiring transitioning to audio format  only ( telephone ). All issues noted this document were discussed and addressed,no physical exam can be performed in this format.    History of Present Illness: F/U chronic problems Labs overdue Pt reports taking no prescription and weight loss and good blood pressure and blood sugar values because of her lifestyle change Refuses mamamogram a, but will have other age appropriate cancer screening tests States she feels good, denies fatigue , thirst , dry mouth or exceesive urination Denies recent fever or chills. Denies sinus pressure, nasal congestion, ear pain or sore throat. Denies chest congestion, productive cough or wheezing. Denies chest pains, palpitations and leg swelling Denies abdominal pain, nausea, vomiting,diarrhea or constipation.   Denies dysuria, frequency, hesitancy or incontinence. Denies joint pain, swelling and limitation in mobility. Denies headaches, seizures, numbness, or tingling. Denies depression, anxiety or insomnia. Denies skin  break down or rash.       Observations/Objective: BP 102/67   Ht 5\' 4"  (1.626 m)   Wt 153 lb (69.4 kg)   BMI 26.26 kg/m   Good communication with no confusion and intact memory. Alert and oriented x 3 No signs of respiratory distress during sppech   Assessment and Plan:  Hyperlipidemia upfdtaed labs past due will review when completed in next 1 to 2 weeks Hyperlipidemia:Low fat diet discussed and encouraged.   Lipid Panel  Lab Results  Component Value Date   CHOL 198 10/28/2017   HDL 33 (L) 10/28/2017   LDLCALC 131 (H) 10/28/2017   TRIG 206 (H) 10/28/2017   CHOLHDL 6.0 (H) 10/28/2017       Type II diabetes mellitus ([place]) Updated lab past due Reports corerection because of change in diet [name] is reminded of the importance of commitment to daily physical activity for 30 minutes or more, as able and the need to limit carbohydrate intake to 30 to 60 grams per meal to help with blood sugar control.   The need to take medication as prescribed, test blood sugar as directed, and to call between visits if there is a concern that blood sugar is uncontrolled is also discussed.   [name] is reminded of the importance of daily foot exam, annual eye examination, and good blood sugar, blood pressure and cholesterol control.  Diabetic Labs Latest Ref Rng & Units 10/30/2017 10/28/2017 07/19/2017 02/18/2017 10/09/2016  HbA1c <5.7 % of total Hgb - 13.6(H) 14.0(H) 11.1(H) -  Microalbumin mg/dL 3.9 - - - 23.9(H)  Micro/Creat Ratio <30 mcg/mg creat 36(H) - - - 28.7  Chol <200 mg/dL - 198 175 - -  HDL >50 mg/dL - 33(L) 35(L) - -  Calc LDL mg/dL (calc) - 131(H) 118(H) - -  Triglycerides <150 mg/dL - 206(H) 108 - -  Creatinine 0.50 - 0.99 mg/dL - 0.56 0.67 0.67 -   BP/Weight 04/30/2019 10/29/2017 07/24/2017 06/17/2017 11/19/2016 10/09/2016 00/51/1021  Systolic BP 117 356 701 410 301 314 388  Diastolic BP 67 78 62 78 72 80 60  Wt. (Lbs) 153 169 177.25 175.04 181 182.12 177.04  BMI  26.26 29.01 30.42 30.05 31.07 31.26 30.39   Foot/eye exam completion dates Latest Ref Rng & Units 10/29/2017 10/09/2016  Eye Exam No Retinopathy - -  Foot exam Order - - -  Foot Form Completion - Done Done        Non compliance with medical treatment Not taking any proscription meds for her chronic health conditions ,but does agree to have labs and to come in for her pap which is due in Sept  Malignant neoplasm of breast (female) Till sttates will not follow up on this, states that the Oncologist she saw years ago " scared her"Will encourage her to change her strategyn when she comes in to office   Follow Up Instructions:    I discussed the assessment and treatment plan with the patient. The patient was provided an opportunity to ask questions and all were answered. The patient agreed with the plan and demonstrated an understanding of the instructions.   The patient was advised to call back or seek an in-person evaluation if the symptoms worsen or if the condition fails to improve as anticipated.  I provided 25 minutes of non-face-to-face time during this encounter.   Tula Nakayama, MD

## 2019-05-01 ENCOUNTER — Encounter: Payer: Self-pay | Admitting: Family Medicine

## 2019-05-01 DIAGNOSIS — Z7189 Other specified counseling: Secondary | ICD-10-CM | POA: Insufficient documentation

## 2019-05-01 HISTORY — DX: Other specified counseling: Z71.89

## 2019-05-01 NOTE — Assessment & Plan Note (Signed)
Updated lab past due Reports corerection because of change in diet [name] is reminded of the importance of commitment to daily physical activity for 30 minutes or more, as able and the need to limit carbohydrate intake to 30 to 60 grams per meal to help with blood sugar control.   The need to take medication as prescribed, test blood sugar as directed, and to call between visits if there is a concern that blood sugar is uncontrolled is also discussed.   [name] is reminded of the importance of daily foot exam, annual eye examination, and good blood sugar, blood pressure and cholesterol control.  Diabetic Labs Latest Ref Rng & Units 10/30/2017 10/28/2017 07/19/2017 02/18/2017 10/09/2016  HbA1c <5.7 % of total Hgb - 13.6(H) 14.0(H) 11.1(H) -  Microalbumin mg/dL 3.9 - - - 23.9(H)  Micro/Creat Ratio <30 mcg/mg creat 36(H) - - - 28.7  Chol <200 mg/dL - 198 175 - -  HDL >50 mg/dL - 33(L) 35(L) - -  Calc LDL mg/dL (calc) - 131(H) 118(H) - -  Triglycerides <150 mg/dL - 206(H) 108 - -  Creatinine 0.50 - 0.99 mg/dL - 0.56 0.67 0.67 -   BP/Weight 04/30/2019 10/29/2017 07/24/2017 06/17/2017 11/19/2016 10/09/2016 16/83/7290  Systolic BP 211 155 208 022 336 122 449  Diastolic BP 67 78 62 78 72 80 60  Wt. (Lbs) 153 169 177.25 175.04 181 182.12 177.04  BMI 26.26 29.01 30.42 30.05 31.07 31.26 30.39   Foot/eye exam completion dates Latest Ref Rng & Units 10/29/2017 10/09/2016  Eye Exam No Retinopathy - -  Foot exam Order - - -  Foot Form Completion - Done Done

## 2019-05-01 NOTE — Assessment & Plan Note (Signed)
Till sttates will not follow up on this, states that the Oncologist she saw years ago " scared her"Will encourage her to change her strategyn when she comes in to office

## 2019-05-01 NOTE — Assessment & Plan Note (Signed)
upfdtaed labs past due will review when completed in next 1 to 2 weeks Hyperlipidemia:Low fat diet discussed and encouraged.   Lipid Panel  Lab Results  Component Value Date   CHOL 198 10/28/2017   HDL 33 (L) 10/28/2017   LDLCALC 131 (H) 10/28/2017   TRIG 206 (H) 10/28/2017   CHOLHDL 6.0 (H) 10/28/2017

## 2019-05-01 NOTE — Assessment & Plan Note (Signed)
Covid-19 Education  The signs and symptoms of of COVID -19 were discussed with the patient and how to seek care for testing. ( follow up with PCP or arrange  E-visit) The importance of social  distancing is discussed today.  

## 2019-05-01 NOTE — Assessment & Plan Note (Signed)
" >>  ASSESSMENT AND PLAN FOR TYPE II DIABETES MELLITUS ([facility]) WRITTEN ON 05/01/2019  1:05 AM BY Shakeisha Horine E, MD  Updated lab past due Reports corerection because of change in diet [name] is reminded of the importance of commitment to daily physical activity for 30 minutes or more, as able and the need to limit carbohydrate intake to 30 to 60 grams per meal to help with blood sugar control.   The need to take medication as prescribed, test blood sugar as directed, and to call between visits if there is a concern that blood sugar is uncontrolled is also discussed.   [name] is reminded of the importance of daily foot exam, annual eye examination, and good blood sugar, blood pressure and cholesterol control.  Diabetic Labs Latest Ref Rng & Units 10/30/2017 10/28/2017 07/19/2017 02/18/2017 10/09/2016  HbA1c <5.7 % of total Hgb - 13.6(H) 14.0(H) 11.1(H) -  Microalbumin mg/dL 3.9 - - - 23.9(H)  Micro/Creat Ratio <30 mcg/mg creat 36(H) - - - 28.7  Chol <200 mg/dL - 801 824 - -  HDL >49 mg/dL - 66(O) 64(O) - -  Calc LDL mg/dL (calc) - 868(Y) 881(Y) - -  Triglycerides <150 mg/dL - 793(Y) 891 - -  Creatinine 0.50 - 0.99 mg/dL - 9.43 9.32 9.32 -   BP/Weight 04/30/2019 10/29/2017 07/24/2017 06/17/2017 11/19/2016 10/09/2016 09/13/2016  Systolic BP 102 118 118 124 123 122 104  Diastolic BP 67 78 62 78 72 80 60  Wt. (Lbs) 153 169 177.25 175.04 181 182.12 177.04  BMI 26.26 29.01 30.42 30.05 31.07 31.26 30.39   Foot/eye exam completion dates Latest Ref Rng & Units 10/29/2017 10/09/2016  Eye Exam No Retinopathy - -  Foot exam Order - - -  Foot Form Completion - Done Done       "

## 2019-05-01 NOTE — Assessment & Plan Note (Signed)
Not taking any proscription meds for her chronic health conditions ,but does agree to have labs and to come in for her pap which is due in Sept

## 2019-07-23 ENCOUNTER — Telehealth: Payer: Self-pay | Admitting: Family Medicine

## 2019-07-23 ENCOUNTER — Other Ambulatory Visit: Payer: Self-pay

## 2019-07-23 ENCOUNTER — Ambulatory Visit (INDEPENDENT_AMBULATORY_CARE_PROVIDER_SITE_OTHER): Payer: Medicare Other | Admitting: Family Medicine

## 2019-07-23 ENCOUNTER — Other Ambulatory Visit (HOSPITAL_COMMUNITY)
Admission: RE | Admit: 2019-07-23 | Discharge: 2019-07-23 | Disposition: A | Discharge status: Home / Self Care | Payer: Medicare Other | Source: Ambulatory Visit | Attending: Family Medicine | Admitting: Family Medicine

## 2019-07-23 ENCOUNTER — Encounter (HOSPITAL_COMMUNITY): Payer: Self-pay | Admitting: Emergency Medicine

## 2019-07-23 ENCOUNTER — Emergency Department (HOSPITAL_COMMUNITY)
Admission: EM | Admit: 2019-07-23 | Discharge: 2019-07-24 | Disposition: A | Discharge status: Home / Self Care | Payer: Medicare Other | Attending: Emergency Medicine | Admitting: Emergency Medicine

## 2019-07-23 ENCOUNTER — Encounter: Payer: Self-pay | Admitting: Family Medicine

## 2019-07-23 VITALS — BP 127/85 | HR 110 | Temp 98.7°F | Resp 12 | Ht 64.0 in | Wt 154.1 lb

## 2019-07-23 DIAGNOSIS — E1165 Type 2 diabetes mellitus with hyperglycemia: Secondary | ICD-10-CM | POA: Insufficient documentation

## 2019-07-23 DIAGNOSIS — N390 Urinary tract infection, site not specified: Secondary | ICD-10-CM | POA: Diagnosis not present

## 2019-07-23 DIAGNOSIS — R110 Nausea: Secondary | ICD-10-CM

## 2019-07-23 DIAGNOSIS — I10 Essential (primary) hypertension: Secondary | ICD-10-CM | POA: Insufficient documentation

## 2019-07-23 DIAGNOSIS — R350 Frequency of micturition: Secondary | ICD-10-CM | POA: Diagnosis not present

## 2019-07-23 DIAGNOSIS — E1140 Type 2 diabetes mellitus with diabetic neuropathy, unspecified: Secondary | ICD-10-CM | POA: Insufficient documentation

## 2019-07-23 DIAGNOSIS — R739 Hyperglycemia, unspecified: Secondary | ICD-10-CM

## 2019-07-23 DIAGNOSIS — Z7982 Long term (current) use of aspirin: Secondary | ICD-10-CM | POA: Insufficient documentation

## 2019-07-23 LAB — BASIC METABOLIC PANEL
Anion gap: 14 (ref 5–15)
BUN: 20 mg/dL (ref 8–23)
CO2: 22 mmol/L (ref 22–32)
Calcium: 8.9 mg/dL (ref 8.9–10.3)
Chloride: 87 mmol/L — ABNORMAL LOW (ref 98–111)
Creatinine, Ser: 0.82 mg/dL (ref 0.44–1.00)
GFR calc Af Amer: >60 mL/min (ref >60)
GFR calc non Af Amer: >60 mL/min (ref >60)
Glucose, Bld: 971 mg/dL (ref 70–99)
Potassium: 4.7 mmol/L (ref 3.5–5.1)
Sodium: 123 mmol/L — ABNORMAL LOW (ref 135–145)

## 2019-07-23 LAB — CBC
HCT: 43.3 % (ref 36.0–46.0)
HCT: 42.3 % (ref 36.0–46.0)
Hemoglobin: 14 g/dL (ref 12.0–15.0)
Hemoglobin: 13.5 g/dL (ref 12.0–15.0)
MCH: 26.5 pg (ref 26.0–34.0)
MCH: 26.2 pg (ref 26.0–34.0)
MCHC: 32.3 g/dL (ref 30.0–36.0)
MCHC: 31.9 g/dL (ref 30.0–36.0)
MCV: 81.9 fL (ref 80.0–100.0)
MCV: 82.1 fL (ref 80.0–100.0)
Platelets: 202 10*3/uL (ref 150–400)
Platelets: 188 10*3/uL (ref 150–400)
RBC: 5.29 MIL/uL — ABNORMAL HIGH (ref 3.87–5.11)
RBC: 5.15 MIL/uL — ABNORMAL HIGH (ref 3.87–5.11)
RDW: 12.9 % (ref 11.5–15.5)
RDW: 13 % (ref 11.5–15.5)
WBC: 11.9 10*3/uL — ABNORMAL HIGH (ref 4.0–10.5)
WBC: 11 10*3/uL — ABNORMAL HIGH (ref 4.0–10.5)
nRBC: 0 % (ref 0.0–0.2)
nRBC: 0 % (ref 0.0–0.2)

## 2019-07-23 LAB — POCT URINALYSIS DIP (CLINITEK)
Bilirubin, UA: NEGATIVE
Glucose, UA: 500 mg/dL — AB
Ketones, POC UA: NEGATIVE mg/dL
Nitrite, UA: NEGATIVE
POC PROTEIN,UA: NEGATIVE
Spec Grav, UA: <=1.005 — AB (ref 1.010–1.025)
Urobilinogen, UA: 0.2 E.U./dL
pH, UA: 6.5 (ref 5.0–8.0)

## 2019-07-23 LAB — CBG MONITORING, ED
Glucose-Capillary: >600 mg/dL (ref 70–99)
Glucose-Capillary: 542 mg/dL (ref 70–99)
Glucose-Capillary: 377 mg/dL — ABNORMAL HIGH (ref 70–99)

## 2019-07-23 LAB — URINALYSIS, ROUTINE W REFLEX MICROSCOPIC
Bacteria, UA: NONE SEEN
Bilirubin Urine: NEGATIVE
Glucose, UA: >=500 mg/dL — AB
Ketones, ur: NEGATIVE mg/dL
Nitrite: NEGATIVE
Protein, ur: NEGATIVE mg/dL
Specific Gravity, Urine: 1.029 (ref 1.005–1.030)
pH: 6 (ref 5.0–8.0)

## 2019-07-23 LAB — COMPREHENSIVE METABOLIC PANEL
ALT: 53 U/L — ABNORMAL HIGH (ref 0–44)
AST: 32 U/L (ref 15–41)
Albumin: 3.9 g/dL (ref 3.5–5.0)
Alkaline Phosphatase: 107 U/L (ref 38–126)
Anion gap: 14 (ref 5–15)
BUN: 20 mg/dL (ref 8–23)
CO2: 22 mmol/L (ref 22–32)
Calcium: 9 mg/dL (ref 8.9–10.3)
Chloride: 89 mmol/L — ABNORMAL LOW (ref 98–111)
Creatinine, Ser: 0.7 mg/dL (ref 0.44–1.00)
GFR calc Af Amer: >60 mL/min (ref >60)
GFR calc non Af Amer: >60 mL/min (ref >60)
Glucose, Bld: 857 mg/dL (ref 70–99)
Potassium: 4.4 mmol/L (ref 3.5–5.1)
Sodium: 125 mmol/L — ABNORMAL LOW (ref 135–145)
Total Bilirubin: 0.6 mg/dL (ref 0.3–1.2)
Total Protein: 7.9 g/dL (ref 6.5–8.1)

## 2019-07-23 LAB — GLUCOSE, POCT (MANUAL RESULT ENTRY)

## 2019-07-23 MED ORDER — METFORMIN HCL 500 MG PO TABS: 500.0000 mg | ORAL_TABLET | Freq: Two times a day (BID) | ORAL | 2 refills | Status: DC

## 2019-07-23 MED ORDER — SITAGLIPTIN PHOSPHATE 50 MG PO TABS: 50.0000 mg | ORAL_TABLET | Freq: Every day | ORAL | 0 refills | Status: DC

## 2019-07-23 MED ORDER — CEPHALEXIN 500 MG PO CAPS: 500.0000 mg | ORAL_CAPSULE | Freq: Four times a day (QID) | ORAL | 0 refills | Status: AC

## 2019-07-23 MED ORDER — SODIUM CHLORIDE 0.9 % IV BOLUS (SEPSIS)
1000.0000 mL | Freq: Once | INTRAVENOUS | Status: AC
  Administered 2019-07-23: 1000 mL via INTRAVENOUS

## 2019-07-23 MED ORDER — INSULIN ASPART 100 UNIT/ML ~~LOC~~ SOLN
12.0000 [IU] | Freq: Once | SUBCUTANEOUS | Status: AC
  Administered 2019-07-23: 20:00:00 12 [IU] via SUBCUTANEOUS
  Filled 2019-07-23: qty 1

## 2019-07-23 MED ORDER — SODIUM CHLORIDE 0.9 % IV SOLN
1000.0000 mL | INTRAVENOUS | Status: DC
  Administered 2019-07-23: 1000 mL via INTRAVENOUS

## 2019-07-23 MED ORDER — SODIUM CHLORIDE 0.9 % IV BOLUS
1000.0000 mL | Freq: Once | INTRAVENOUS | Status: AC
  Administered 2019-07-23: 22:00:00 1000 mL via INTRAVENOUS

## 2019-07-23 MED ORDER — SODIUM CHLORIDE 0.9 % IV BOLUS (SEPSIS)
1000.0000 mL | Freq: Once | INTRAVENOUS | Status: AC
  Administered 2019-07-23: 19:00:00 1000 mL via INTRAVENOUS

## 2019-07-23 NOTE — Discharge Instructions (Signed)
Follow up with your Physician for recheck  

## 2019-07-23 NOTE — Patient Instructions (Signed)
    Thank you for coming into the office today. I appreciate the opportunity to provide you with the care for your health and wellness. Today we discussed: DM, elevated BS Follow Up: 1-2 weeks  LABS STAT today  Pending labs we may or may nor recommend you going back to the ED. We will give you medications base of the results.  If you start to feel worse please go ahead to the ED.  Please continue to practice social distancing to keep you, your family, and our community safe.  If you must go out, please wear a Mask and practice good handwashing.  [place] YOUR HANDS WELL AND FREQUENTLY. AVOID TOUCHING YOUR FACE, UNLESS YOUR HANDS ARE FRESHLY WASHED.  GET FRESH AIR DAILY. STAY HYDRATED WITH WATER.   It was a pleasure to see you and I look forward to continuing to work together on your health and well-being. Please do not hesitate to call the office if you need care or have questions about your care.  Have a wonderful day and week.  With Gratitude,  Cherly Beach, DNP, AGNP-BC

## 2019-07-23 NOTE — ED Provider Notes (Signed)
[facility] EMERGENCY DEPARTMENT Provider Note   CSN: [id number] Arrival date & time: 07/23/19  1741     History   Chief Complaint Chief Complaint  Patient presents with  . Hyperglycemia    HPI [name] is a 63 y.o. female.     The history is provided by the patient. No language interpreter was used.  Hyperglycemia Blood sugar level PTA:  900 Severity:  Severe Onset quality:  Gradual Timing:  Constant Progression:  Worsening Diabetes status:  Unable to specify Relieved by:  Nothing Ineffective treatments:  None tried Associated symptoms: increased appetite, increased thirst and polyuria   Risk factors: no family hx of diabetes   Pt taking multiple supplements.  Pt's Md sent her in because blood glucose is high   Past Medical History:  Diagnosis Date  . Allergic rhinitis   . Anemia, deficiency   . Diabetes mellitus   . GERD (gastroesophageal reflux disease)   . Hyperlipidemia   . Hypertension   . Obesity     Patient Active Problem List   Diagnosis Date Noted  . Educated About Covid-19 Virus Infection 05/01/2019  . Post-menopausal bleeding 08/15/2015  . Non compliance with medical treatment 08/15/2015  . Malignant neoplasm of breast (female) ([place]) 11/28/2010  . Abnormal mammogram 06/12/2010  . Hyperlipidemia 05/22/2010  . Type II diabetes mellitus ([place]) 02/27/2008  . Essential hypertension 02/27/2008  . ALLERGIC RHINITIS, SEASONAL 02/27/2008  . GERD 02/27/2008    Past Surgical History:  Procedure Laterality Date  . EXTRACTION OF SEVENTH TEETH  2007  . REMOVAL OF GREAT TOENAIL  2005  . SPLENECTOMY  2002     OB History    Gravida  1   Para  1   Term  1   Preterm      AB      Living        SAB      TAB      Ectopic      Multiple      Live Births               Home Medications    Prior to Admission medications   Medication Sig Start Date End Date Taking? Authorizing Provider  acetaminophen (TYLENOL) 500 MG tablet  Take 500 mg by mouth every 6 (six) hours as needed for mild pain or moderate pain.    [provider]  Alpha-D-Galactosidase (BEANO PO) Take 1 capsule by mouth daily.    [provider]  aspirin 81 MG tablet Take 81 mg by mouth daily.      [provider]  Blood Glucose Monitoring Suppl (BAYER CONTOUR MONITOR) W/DEVICE KIT by Does not apply route daily.      [provider]  Calcium-Vitamin D (SUPER CALCIUM/D) 600-125 MG-UNIT TABS Take 1 tablet by mouth daily.     [provider]  [facility] Liver Oil CAPS Take 1 capsule by mouth daily.     [provider]  Echinacea-Goldenseal (ECHINACEA COMB/GOLDEN SEAL PO) Take by mouth.    [provider]  Garlic Oil 631 MG CAPS Take 1 capsule by mouth daily.     [provider]  Multiple Vitamins-Minerals (MULTIPLE VITAMINS/WOMENS) tablet Take 1 tablet by mouth daily.      [provider]  OVER THE COUNTER MEDICATION Take 1 capsule by mouth daily. Leafy Half    [provider]  OVER THE COUNTER MEDICATION Take 200 mcg by mouth daily. Chromium    [provider]  OVER THE COUNTER MEDICATION Take 4 capsules by mouth daily. Colostrum    [provider]  SURE COMFORT LANCETS 30G MISC TEST BLOOD SUGAR ONCE DAILY 03/11/15   [name], MD  Turmeric 500 MG TABS Take 2 tablets by mouth 3 (three) times daily.    [provider]    Family History Family History  Problem Relation Age of Onset  . Diabetes Mother   . Hypertension Mother   . Hyperlipidemia Mother   . Lung disease Father   . Hypertension Sister   . Hyperlipidemia Sister   . Hypertension Brother   . Sickle cell trait Son   . Arthritis Other   . Asthma Other   . Cancer Other     Social History Social History   Tobacco Use  . Smoking status: Never Smoker  . Smokeless tobacco: Never Used  Substance Use Topics  . Alcohol use: No  . Drug use: No     Allergies   Codeine    Review of Systems Review of Systems  Endocrine: Positive for polydipsia and polyuria.  All other systems reviewed and are negative.    Physical Exam Updated Vital Signs BP 129/84 (BP Location: Right Arm)   Pulse 98   Temp 98.1 F (36.7 C) (Oral)   Resp 16   SpO2 94%   Physical Exam Vitals signs and nursing note reviewed.  Constitutional:      Appearance: She is well-developed.  HENT:     Head: Normocephalic.     Right Ear: Tympanic membrane normal.     Left Ear: Tympanic membrane normal.     Nose: Nose normal.     Mouth/Throat:     Mouth: Mucous membranes are moist.  Eyes:     Pupils: Pupils are equal, round, and reactive to light.  Neck:     Musculoskeletal: Normal range of motion and neck supple.  Cardiovascular:     Rate and Rhythm: Normal rate and regular rhythm.  Pulmonary:     Effort: Pulmonary effort is normal.  Abdominal:     General: Abdomen is flat. There is no distension.  Musculoskeletal: Normal range of motion.  Neurological:     Mental Status: She is alert and oriented to person, place, and time.  Psychiatric:        Mood and Affect: Mood normal.      ED Treatments / Results  Labs (all labs ordered are listed, but only abnormal results are displayed) Labs Reviewed  BASIC METABOLIC PANEL - Abnormal; Notable for the following components:      Result Value   Sodium 123 (*)    Chloride 87 (*)    Glucose, Bld 971 (*)    All other components within normal limits  CBC - Abnormal; Notable for the following components:   WBC 11.0 (*)    RBC 5.15 (*)    All other components within normal limits  URINALYSIS, ROUTINE W REFLEX MICROSCOPIC - Abnormal; Notable for the following components:   Color, Urine STRAW (*)    Glucose, UA >=500 (*)    Hgb urine dipstick SMALL (*)    Leukocytes,Ua SMALL (*)    All other components within normal limits  CBG MONITORING, ED - Abnormal; Notable for the following components:   Glucose-Capillary >600 (*)    All  other components within normal limits  CBG MONITORING, ED    EKG None  Radiology No results found.  Procedures Procedures (including critical care time)  Medications Ordered in ED Medications  sodium chloride 0.9 % bolus 1,000 mL (0 mLs Intravenous Stopped 07/23/19 2010)    Followed by  sodium chloride 0.9 % bolus 1,000 mL (0 mLs Intravenous Stopped 07/23/19 2010)    Followed by  0.9 %  sodium chloride infusion (1,000 mLs Intravenous New Bag/Given 07/23/19 2011)  insulin aspart (novoLOG) injection 12 Units (12 Units Subcutaneous Given 07/23/19 2012)     Initial Impression / Assessment and Plan / ED Course  I have reviewed the triage vital signs and the nursing notes.  Pertinent labs & imaging results that were available during my care of the patient were reviewed by me and considered in my medical decision making (see chart for details).        MDM  Pt had a glucose of 971.  Pt has been eating watermelon and soda.  Pt states she thought she had a uti she has been trying to treat.  Pt has no signs of dka.  Glucose decreased with Iv fluids and insulin.  Pt is not taking any diabetes medication.  She states she does not want to be on metformin or glipizide.  The only drug pt is interested in taking she heard about on TV.  Pt request Januvia.  (I advised I do not normally prescribe this medication)  I do not know if insurance will cover)  Pt gives a rambling history of all the problems with medications she has been prescribed and states she has been taking supplements from [facility].    I discussed with pt the complications from untreated diabetes.  I am concerned pt is not going to take medication  I have advised her she needs to see her MD for recheck.  Final Clinical Impressions(s) / ED Diagnoses   Final diagnoses:  Hyperglycemia  Urinary tract infection without hematuria, site unspecified    ED Discharge Orders         Ordered    metFORMIN (GLUCOPHAGE) 500 MG tablet  2  times daily with meals     07/23/19 2302    sitaGLIPtin (JANUVIA) 50 MG tablet  Daily     07/23/19 2319    cephALEXin (KEFLEX) 500 MG capsule  4 times daily     07/23/19 2319        An After Visit Summary was printed and given to the patient.   Fransico Meadow, Hershal Coria 07/23/19 2358    Margette Fast, MD 07/24/19 1248

## 2019-07-23 NOTE — Progress Notes (Signed)
  Subjective:     Patient ID: [name], female   DOB: 09/20/1956, 63 y.o.   MRN: [id number]  [name] presents for blood sugars have been dropping (has been dieting, doing Adkins diet, and drinking [employer] protein shakes with low carbs)  History of hypertension, GERD, type 2 diabetes nonadherent to medication, hyperlipidemia, abnormal mammogram with refusal to have follow-up treatment.  Diabetes follow-up:  She has not been checking her blood sugars. She comes in today thinking BS are dropping. Unable to use machine she has. Has not been adherent in the past with medications Dr Simpson has tried numerous times to help her DM get under control.   Denies non-healing wounds or rashes. Denies signs of UTI (does report increase urination) or other infections.  Was trying to lose weight with [employer] Shakes.  She reports that someone told her to not drink those because he had no carbohydrates or sugars in them.  And that might be why she was having low blood sugars based off of how she "felt" as she has not been checking her blood sugars.  Now is on Boost Shakes due to no sugar in the others.   She reports having visual disturbances, polydipsia, polyphagia, polyuria, headaches, some dizziness lightheadedness, nausea without vomiting.  Appetite change related to the fact that she was trying to lose weight.  Today patient denies signs and symptoms of COVID 19 infection including fever, chills, cough, shortness of breath, and headache.  Past Medical, Surgical, Social History, Allergies, and Medications have been Reviewed.    Past Medical History:  Diagnosis Date  . Allergic rhinitis   . Anemia, deficiency   . Diabetes mellitus   . GERD (gastroesophageal reflux disease)   . Hyperlipidemia   . Hypertension   . Obesity    Past Surgical History:  Procedure Laterality Date  . EXTRACTION OF SEVENTH TEETH  2007  . REMOVAL OF GREAT TOENAIL  2005  . SPLENECTOMY  2002   Social History    Socioeconomic History  . Marital status: Divorced    Spouse name: Not on file  . Number of children: 1  . Years of education: Not on file  . Highest education level: Not on file  Occupational History  . Occupation: DISABLED   Social Needs  . Financial resource strain: Not on file  . Food insecurity    Worry: Not on file    Inability: Not on file  . Transportation needs    Medical: Not on file    Non-medical: Not on file  Tobacco Use  . Smoking status: Never Smoker  . Smokeless tobacco: Never Used  Substance and Sexual Activity  . Alcohol use: No  . Drug use: No  . Sexual activity: Yes    Birth control/protection: Post-menopausal  Lifestyle  . Physical activity    Days per week: Not on file    Minutes per session: Not on file  . Stress: Not on file  Relationships  . Social connections    Talks on phone: Not on file    Gets together: Not on file    Attends religious service: Not on file    Active member of club or organization: Not on file    Attends meetings of clubs or organizations: Not on file    Relationship status: Not on file  . Intimate partner violence    Fear of current or ex partner: Not on file    Emotionally abused: Not on file      Physically abused: Not on file    Forced sexual activity: Not on file  Other Topics Concern  . Not on file  Social History Narrative  . Not on file    Outpatient Encounter Medications as of 07/23/2019  Medication Sig  . acetaminophen (TYLENOL) 500 MG tablet Take 500 mg by mouth every 6 (six) hours as needed for mild pain or moderate pain.  . Alpha-D-Galactosidase (BEANO PO) Take 1 capsule by mouth daily.  . aspirin 81 MG tablet Take 81 mg by mouth daily.    . Blood Glucose Monitoring Suppl (BAYER CONTOUR MONITOR) W/DEVICE KIT by Does not apply route daily.    . Calcium-Vitamin D (SUPER CALCIUM/D) 600-125 MG-UNIT TABS Take 1 tablet by mouth daily.   . Cod Liver Oil CAPS Take 1 capsule by mouth daily.   .  Echinacea-Goldenseal (ECHINACEA COMB/GOLDEN SEAL PO) Take by mouth.  . Garlic Oil 500 MG CAPS Take 1 capsule by mouth daily.   . Multiple Vitamins-Minerals (MULTIPLE VITAMINS/WOMENS) tablet Take 1 tablet by mouth daily.    . OVER THE COUNTER MEDICATION Take 1 capsule by mouth daily. Urisense  . OVER THE COUNTER MEDICATION Take 200 mcg by mouth daily. Chromium  . OVER THE COUNTER MEDICATION Take 4 capsules by mouth daily. Colostrum  . SURE COMFORT LANCETS 30G MISC TEST BLOOD SUGAR ONCE DAILY  . Turmeric 500 MG TABS Take 2 tablets by mouth 3 (three) times daily.   No facility-administered encounter medications on file as of 07/23/2019.    Allergies  Allergen Reactions  . Codeine     Review of Systems  Constitutional: Positive for appetite change. Negative for chills and fever.  HENT: Negative.   Eyes: Positive for visual disturbance.  Respiratory: Negative.  Negative for cough and shortness of breath.   Cardiovascular: Negative.  Negative for chest pain, palpitations and leg swelling.  Gastrointestinal: Positive for nausea.  Endocrine: Positive for polydipsia, polyphagia and polyuria.  Genitourinary: Negative.   Musculoskeletal: Negative.   Skin: Negative.   Allergic/Immunologic: Negative.   Neurological: Positive for headaches.  Hematological: Negative.   Psychiatric/Behavioral: Negative.   All other systems reviewed and are negative.       Objective:     BP 127/85   Pulse (!) 110   Temp 98.7 F (37.1 C) (Oral)   Resp 12   Ht 5' 4" (1.626 m)   Wt 154 lb 1.9 oz (69.9 kg)   SpO2 95%   BMI 26.45 kg/m   Physical Exam Vitals signs and nursing note reviewed.  Constitutional:      Appearance: Normal appearance. She is well-developed and well-groomed.  HENT:     Head: Normocephalic and atraumatic.     Right Ear: External ear normal.     Left Ear: External ear normal.     Nose: Nose normal.     Mouth/Throat:     Mouth: Mucous membranes are moist.     Pharynx:  Oropharynx is clear.  Eyes:     General:        Right eye: No discharge.        Left eye: No discharge.     Conjunctiva/sclera: Conjunctivae normal.  Neck:     Musculoskeletal: Normal range of motion and neck supple.  Cardiovascular:     Rate and Rhythm: Regular rhythm. Tachycardia present.     Pulses: Normal pulses.     Heart sounds: Normal heart sounds.  Pulmonary:     Effort: Pulmonary effort is normal.       Breath sounds: Normal breath sounds.  Musculoskeletal: Normal range of motion.  Skin:    General: Skin is warm.  Neurological:     General: No focal deficit present.     Mental Status: She is alert and oriented to person, place, and time.  Psychiatric:        Attention and Perception: Attention normal.        Mood and Affect: Mood normal.        Speech: Speech normal.        Behavior: Behavior normal. Behavior is cooperative.        Thought Content: Thought content normal.        Cognition and Memory: Cognition normal.        Judgment: Judgment normal.        Assessment and Plan              1. Type 2 diabetes mellitus with diabetic neuropathy, without long-term current use of insulin (HCC) She reports feeling bad, thinks sugar is dropping. Machine didn't work for her and has not been medically adherent in the past Checking UA in office and sending for STAT labs.  Lab closed so sent to ED. Discussed case with Dr Simpson. We believe she would be better served going to the ED due to all the complaints she had with possible elevated BG and tachycardia, and now WBC elevation. CMET glucose read over 800. Dr Simpson has spoke to her and advised her to go to the ED.   - COMPLETE METABOLIC PANEL WITH GFR - CBC - Hemoglobin A1c - POCT CBG monitoring - POCT URINALYSIS DIP (CLINITEK) - Microalbumin / creatinine urine ratio  2. Increased frequency of urination S&S of uncontrolled DM and maybe UTI  - POCT CBG monitoring - POCT URINALYSIS DIP (CLINITEK)  3.  Elevated blood sugar level Unable to get reading today in office, due to blood sugar being so hi. CBG only read HI in office. STAT labs ordered  - Hemoglobin A1c - POCT CBG monitoring  4. Nausea Symptoms of uncontrolled BS See above   - POCT CBG monitoring  Follow Up: 1-2 weeks  Hannah M. Mills, DNP, AGNP-BC [facility] [address], [address] [place], [place] [address] Office Hours: Mon-Thurs 8 am-5 pm; Fri 8 am-12 pm Office Phone:  [phone]  Office Fax: [phone]     

## 2019-07-23 NOTE — ED Triage Notes (Signed)
Patient reports blood sugar over 800.

## 2019-07-23 NOTE — ED Notes (Signed)
Date and time results received: 07/23/19 1918 (use smartphrase ".now" to insert current time)  Test: glucose Critical Value: 971  Name of Provider Notified: [email or web address]  Orders Received? Or Actions Taken?:

## 2019-07-23 NOTE — Telephone Encounter (Signed)
Call from a stat lab, blood sugar is 837, needs to go directly to the ED , I spoke with her , she has transportaion / someone to take her, she agrees to go directly to [facility] ED, I will call the MD to expect her

## 2019-07-24 ENCOUNTER — Other Ambulatory Visit (HOSPITAL_COMMUNITY)
Admission: RE | Admit: 2019-07-24 | Discharge: 2019-07-24 | Disposition: A | Discharge status: Home / Self Care | Payer: Medicare Other | Source: Ambulatory Visit | Attending: *Deleted | Admitting: *Deleted

## 2019-07-25 LAB — MICROALBUMIN / CREATININE URINE RATIO
Creatinine, Urine: 10.8 mg/dL
Microalb Creat Ratio: <28 mg/g creat (ref 0–29)
Microalb, Ur: <3 ug/mL — ABNORMAL HIGH

## 2019-07-27 ENCOUNTER — Emergency Department (HOSPITAL_COMMUNITY)
Admission: EM | Admit: 2019-07-27 | Discharge: 2019-07-28 | Disposition: A | Discharge status: Home / Self Care | Payer: Medicare Other | Attending: Emergency Medicine | Admitting: Emergency Medicine

## 2019-07-27 ENCOUNTER — Encounter (HOSPITAL_COMMUNITY): Payer: Self-pay | Admitting: Emergency Medicine

## 2019-07-27 ENCOUNTER — Emergency Department (HOSPITAL_COMMUNITY): Payer: Medicare Other

## 2019-07-27 ENCOUNTER — Other Ambulatory Visit: Payer: Self-pay

## 2019-07-27 DIAGNOSIS — M25561 Pain in right knee: Secondary | ICD-10-CM | POA: Diagnosis not present

## 2019-07-27 DIAGNOSIS — Z79899 Other long term (current) drug therapy: Secondary | ICD-10-CM | POA: Insufficient documentation

## 2019-07-27 DIAGNOSIS — I10 Essential (primary) hypertension: Secondary | ICD-10-CM | POA: Insufficient documentation

## 2019-07-27 DIAGNOSIS — Z7984 Long term (current) use of oral hypoglycemic drugs: Secondary | ICD-10-CM | POA: Insufficient documentation

## 2019-07-27 DIAGNOSIS — E119 Type 2 diabetes mellitus without complications: Secondary | ICD-10-CM | POA: Diagnosis not present

## 2019-07-27 DIAGNOSIS — Z7982 Long term (current) use of aspirin: Secondary | ICD-10-CM | POA: Diagnosis not present

## 2019-07-27 LAB — HEMOGLOBIN A1C
Hgb A1c MFr Bld: >15.5 % — ABNORMAL HIGH (ref 4.8–5.6)
Mean Plasma Glucose: >398 mg/dL

## 2019-07-27 MED ORDER — NAPROXEN 250 MG PO TABS
500.0000 mg | ORAL_TABLET | Freq: Once | ORAL | Status: AC
  Administered 2019-07-28: 500 mg via ORAL
  Filled 2019-07-27: qty 2

## 2019-07-27 MED ORDER — HYDROCODONE-ACETAMINOPHEN 5-325 MG PO TABS
1.0000 | ORAL_TABLET | Freq: Once | ORAL | Status: AC
  Administered 2019-07-28: 1 via ORAL
  Filled 2019-07-27: qty 1

## 2019-07-27 NOTE — ED Triage Notes (Signed)
Pain to RT knee after wearing compression stockings, denies any injury

## 2019-07-28 MED ORDER — NAPROXEN 500 MG PO TABS: 500.0000 mg | ORAL_TABLET | Freq: Two times a day (BID) | ORAL | 0 refills | Status: DC

## 2019-07-28 NOTE — Discharge Instructions (Addendum)
You were seen today for knee pain.  Your x-rays are negative.  Ice your knee and use NSAIDs.  Take naproxen for the next 3 to 5 days to see if this helps.

## 2019-07-28 NOTE — ED Provider Notes (Signed)
[facility] EMERGENCY DEPARTMENT Provider Note   CSN: [id number] Arrival date & time: 07/27/19  1743     History   Chief Complaint Chief Complaint  Patient presents with  . Knee Pain    HPI [name] is a 63 y.o. female.     HPI  This is a 62 year old female with a history of diabetes, hypertension, hyperlipidemia who presents with right knee pain.  Patient reports onset of right knee pain after wearing some compression hose 1 to 2 weeks ago.  Pain is over the lateral aspect of her right knee.  She denies any injury.  She denies any swelling.  She reports the pain is worse with ambulation.  When she is laying down pain is 8 out of 10.  She denies numbness or tingling in the foot.  No known history of arthritis.  Denies any fevers or overlying skin changes.  She has taken some over-the-counter pain relief with minimal relief.  Past Medical History:  Diagnosis Date  . Allergic rhinitis   . Anemia, deficiency   . Diabetes mellitus   . GERD (gastroesophageal reflux disease)   . Hyperlipidemia   . Hypertension   . Obesity     Patient Active Problem List   Diagnosis Date Noted  . Educated About Covid-19 Virus Infection 05/01/2019  . Post-menopausal bleeding 08/15/2015  . Non compliance with medical treatment 08/15/2015  . Malignant neoplasm of breast (female) ([place]) 11/28/2010  . Abnormal mammogram 06/12/2010  . Hyperlipidemia 05/22/2010  . Type II diabetes mellitus ([place]) 02/27/2008  . Essential hypertension 02/27/2008  . ALLERGIC RHINITIS, SEASONAL 02/27/2008  . GERD 02/27/2008    Past Surgical History:  Procedure Laterality Date  . EXTRACTION OF SEVENTH TEETH  2007  . REMOVAL OF GREAT TOENAIL  2005  . SPLENECTOMY  2002     OB History    Gravida  1   Para  1   Term  1   Preterm      AB      Living        SAB      TAB      Ectopic      Multiple      Live Births               Home Medications    Prior to Admission medications    Medication Sig Start Date End Date Taking? Authorizing Provider  acetaminophen (TYLENOL) 500 MG tablet Take 500 mg by mouth every 6 (six) hours as needed for mild pain or moderate pain.    [provider]  Alpha-D-Galactosidase (BEANO PO) Take 1 capsule by mouth daily.    [provider]  aspirin 81 MG tablet Take 162 mg by mouth daily.     [provider]  Calcium-Vitamin D (SUPER CALCIUM/D) 600-125 MG-UNIT TABS Take 1 tablet by mouth daily.     [provider]  cephALEXin (KEFLEX) 500 MG capsule Take 1 capsule (500 mg total) by mouth 4 (four) times daily for 7 days. 07/23/19 07/30/19  Fransico Meadow, PA-C  Cod Liver Oil CAPS Take 1 capsule by mouth daily.     [provider]  Echinacea-Goldenseal (ECHINACEA COMB/GOLDEN SEAL PO) Take by mouth.    [provider]  Garlic Oil XX123456 MG CAPS Take 1 capsule by mouth daily.     [provider]  metFORMIN (GLUCOPHAGE) 500 MG tablet Take 1 tablet (500 mg total) by mouth 2 (two) times daily with  a meal for 60 doses. 07/23/19 08/22/19  Fransico Meadow, PA-C  Multiple Vitamins-Minerals (MULTIPLE VITAMINS/WOMENS) tablet Take 1 tablet by mouth daily.      [provider]  naproxen (NAPROSYN) 500 MG tablet Take 1 tablet (500 mg total) by mouth 2 (two) times daily. Limit use to 3-5 days 07/28/19   Aleksey Newbern, Barbette Hair, MD  OVER THE COUNTER MEDICATION Take 1 capsule by mouth daily. Leafy Half    [provider]  OVER THE COUNTER MEDICATION Take 200 mcg by mouth daily. Chromium    [provider]  OVER THE COUNTER MEDICATION Take 4 capsules by mouth daily. Colostrum    [provider]  sitaGLIPtin (JANUVIA) 50 MG tablet Take 1 tablet (50 mg total) by mouth daily. 07/23/19   Fransico Meadow, PA-C  Turmeric 500 MG TABS Take 2 tablets by mouth 3 (three) times daily.    [provider]    Family History Family History  Problem Relation Age of Onset  . Diabetes Mother    . Hypertension Mother   . Hyperlipidemia Mother   . Lung disease Father   . Hypertension Sister   . Hyperlipidemia Sister   . Hypertension Brother   . Sickle cell trait Son   . Arthritis Other   . Asthma Other   . Cancer Other     Social History Social History   Tobacco Use  . Smoking status: Never Smoker  . Smokeless tobacco: Never Used  Substance Use Topics  . Alcohol use: No  . Drug use: No     Allergies   Codeine   Review of Systems Review of Systems  Constitutional: Negative for fever.  Respiratory: Negative for shortness of breath.   Cardiovascular: Negative for chest pain and leg swelling.  Musculoskeletal:       Right knee pain  Skin: Negative for color change.  All other systems reviewed and are negative.    Physical Exam Updated Vital Signs BP 126/82 (BP Location: Right Arm)   Pulse 100   Temp 98.5 F (36.9 C) (Oral)   Resp 16   Ht 1.6 m (5\' 3" )   Wt 68 kg   SpO2 98%   BMI 26.57 kg/m   Physical Exam Vitals signs and nursing note reviewed.  Constitutional:      Appearance: She is well-developed.  HENT:     Head: Normocephalic and atraumatic.  Eyes:     Pupils: Pupils are equal, round, and reactive to light.  Cardiovascular:     Rate and Rhythm: Normal rate and regular rhythm.  Pulmonary:     Effort: Pulmonary effort is normal. No respiratory distress.  Musculoskeletal:     Comments: Focused examination of the right knee with tenderness to palpation over the lateral joint line of the knee, no obvious deformities, no effusion or swelling, no overlying skin changes, normal range of motion with flexion and extension of the knee, no calf or thigh tenderness to palpation  Skin:    General: Skin is warm and dry.  Neurological:     Mental Status: She is alert and oriented to person, place, and time.  Psychiatric:        Mood and Affect: Mood normal.      ED Treatments / Results  Labs (all labs ordered are listed, but only abnormal  results are displayed) Labs Reviewed - No data to display  EKG None  Radiology Dg Knee Complete 4 Views Right  Result Date: 07/27/2019 CLINICAL DATA:  Pain, difficulty walking EXAM: RIGHT KNEE - COMPLETE 4+ VIEW COMPARISON:  None. FINDINGS: Enthesopathic changes along the superior and inferior poles of the patella. No acute bony abnormality. Specifically, no fracture, subluxation, or dislocation. No joint effusion. Joint spaces maintained. IMPRESSION: No acute bony abnormality. Electronically Signed   By: Rolm Baptise M.D.   On: 07/27/2019 18:44    Procedures Procedures (including critical care time)  Medications Ordered in ED Medications  naproxen (NAPROSYN) tablet 500 mg (500 mg Oral Given 07/28/19 0010)  HYDROcodone-acetaminophen (NORCO/VICODIN) 5-325 MG per tablet 1 tablet (1 tablet Oral Given 07/28/19 0010)     Initial Impression / Assessment and Plan / ED Course  I have reviewed the triage vital signs and the nursing notes.  Pertinent labs & imaging results that were available during my care of the patient were reviewed by me and considered in my medical decision making (see chart for details).        Patient presents with right knee pain.  Atraumatic.  Knee exam is largely benign.  She has no tenderness of the calf or thigh and no swelling to suggest DVT.  Suspect arthritis.  X-rays are negative for acute fracture.  Recommend cervicus measures.  Limited course of NSAIDs.  Chart reviewed and patient with normal creatinine within the past month.  After history, exam, and medical workup I feel the patient has been appropriately medically screened and is safe for discharge home. Pertinent diagnoses were discussed with the patient. Patient was given return precautions.   Final Clinical Impressions(s) / ED Diagnoses   Final diagnoses:  Acute pain of right knee    ED Discharge Orders         Ordered    naproxen (NAPROSYN) 500 MG tablet  2 times daily     07/28/19 0013            Samyah Bilbo, Barbette Hair, MD 07/28/19 [phone]

## 2019-07-29 ENCOUNTER — Other Ambulatory Visit: Payer: Self-pay | Admitting: Family Medicine

## 2019-07-29 DIAGNOSIS — R7309 Other abnormal glucose: Secondary | ICD-10-CM

## 2019-07-29 DIAGNOSIS — E1165 Type 2 diabetes mellitus with hyperglycemia: Secondary | ICD-10-CM

## 2019-07-29 NOTE — Progress Notes (Signed)
A1c is extremely high at 15.5. Referral to endocrinology needs to be placed ASAP, please let her know. She is at a level that insulin is needed daily. Is she willing to start this?

## 2019-08-05 ENCOUNTER — Encounter: Payer: Self-pay | Admitting: "Endocrinology

## 2019-08-05 ENCOUNTER — Ambulatory Visit (INDEPENDENT_AMBULATORY_CARE_PROVIDER_SITE_OTHER): Payer: Medicare Other | Admitting: "Endocrinology

## 2019-08-05 ENCOUNTER — Other Ambulatory Visit: Payer: Self-pay

## 2019-08-05 VITALS — BP 117/79 | HR 86 | Ht 64.0 in | Wt 158.0 lb

## 2019-08-05 DIAGNOSIS — Z9119 Patient's noncompliance with other medical treatment and regimen: Secondary | ICD-10-CM

## 2019-08-05 DIAGNOSIS — E1165 Type 2 diabetes mellitus with hyperglycemia: Secondary | ICD-10-CM | POA: Diagnosis not present

## 2019-08-05 DIAGNOSIS — Z91199 Patient's noncompliance with other medical treatment and regimen due to unspecified reason: Secondary | ICD-10-CM

## 2019-08-05 MED ORDER — SITAGLIPTIN PHOSPHATE 50 MG PO TABS: 50.0000 mg | ORAL_TABLET | Freq: Every day | ORAL | 2 refills | Status: DC

## 2019-08-05 MED ORDER — GLIPIZIDE ER 5 MG PO TB24: 5.0000 mg | ORAL_TABLET | Freq: Every day | ORAL | 3 refills | Status: DC

## 2019-08-05 NOTE — Patient Instructions (Signed)

## 2019-08-05 NOTE — Progress Notes (Signed)
Endocrinology Consult Note       08/05/2019, 5:03 PM   Subjective:    Patient ID: [name], female    DOB: 11-May-1956.  [name] is being seen in consultation for management of currently uncontrolled symptomatic diabetes requested by  [name], MD.   Past Medical History:  Diagnosis Date  . Allergic rhinitis   . Anemia, deficiency   . Diabetes mellitus   . GERD (gastroesophageal reflux disease)   . Hyperlipidemia   . Hypertension   . Obesity     Past Surgical History:  Procedure Laterality Date  . EXTRACTION OF SEVENTH TEETH  2007  . REMOVAL OF GREAT TOENAIL  2005  . SPLENECTOMY  2002    Social History   Socioeconomic History  . Marital status: Divorced    Spouse name: Not on file  . Number of children: 1  . Years of education: Not on file  . Highest education level: Not on file  Occupational History  . Occupation: DISABLED   Social Needs  . Financial resource strain: Not on file  . Food insecurity    Worry: Not on file    Inability: Not on file  . Transportation needs    Medical: Not on file    Non-medical: Not on file  Tobacco Use  . Smoking status: Never Smoker  . Smokeless tobacco: Never Used  Substance and Sexual Activity  . Alcohol use: No  . Drug use: No  . Sexual activity: Yes    Birth control/protection: Post-menopausal  Lifestyle  . Physical activity    Days per week: Not on file    Minutes per session: Not on file  . Stress: Not on file  Relationships  . Social Herbalist on phone: Not on file    Gets together: Not on file    Attends religious service: Not on file    Active member of club or organization: Not on file    Attends meetings of clubs or organizations: Not on file    Relationship status: Not on file  Other Topics Concern  . Not on file  Social History Narrative  . Not on file    Family History  Problem  Relation Age of Onset  . Diabetes Mother   . Hypertension Mother   . Hyperlipidemia Mother   . Lung disease Father   . Hypertension Sister   . Hyperlipidemia Sister   . Hypertension Brother   . Sickle cell trait Son   . Arthritis Other   . Asthma Other   . Cancer Other     Outpatient Encounter Medications as of 08/05/2019  Medication Sig  . acetaminophen (TYLENOL) 500 MG tablet Take 500 mg by mouth every 6 (six) hours as needed for mild pain or moderate pain.  . Alpha-D-Galactosidase (BEANO PO) Take 1 capsule by mouth daily.  Marland Kitchen aspirin 81 MG tablet Take 162 mg by mouth daily.   . Calcium-Vitamin D (SUPER CALCIUM/D) 600-125 MG-UNIT TABS Take 1 tablet by mouth daily.   Marland Kitchen Cod Liver Oil CAPS Take 1 capsule by mouth daily.   Marland Kitchen  Echinacea-Goldenseal (ECHINACEA COMB/GOLDEN SEAL PO) Take by mouth.  . Garlic Oil [id number] MG CAPS Take 1 capsule by mouth daily.   Marland Kitchen glipiZIDE (GLUCOTROL XL) 5 MG 24 hr tablet Take 1 tablet (5 mg total) by mouth daily with breakfast.  . metFORMIN (GLUCOPHAGE) 500 MG tablet Take 1 tablet (500 mg total) by mouth 2 (two) times daily with a meal for 60 doses.  . Multiple Vitamins-Minerals (MULTIPLE VITAMINS/WOMENS) tablet Take 1 tablet by mouth daily.    . naproxen (NAPROSYN) 500 MG tablet Take 1 tablet (500 mg total) by mouth 2 (two) times daily. Limit use to 3-5 days  . OVER THE COUNTER MEDICATION Take 1 capsule by mouth daily. Leafy Half  . OVER THE COUNTER MEDICATION Take 200 mcg by mouth daily. Chromium  . OVER THE COUNTER MEDICATION Take 4 capsules by mouth daily. Colostrum  . sitaGLIPtin (JANUVIA) 50 MG tablet Take 1 tablet (50 mg total) by mouth daily. (Patient not taking: Reported on 08/05/2019)  . Turmeric 500 MG TABS Take 2 tablets by mouth 3 (three) times daily.   No facility-administered encounter medications on file as of 08/05/2019.     ALLERGIES: Allergies  Allergen Reactions  . Codeine     VACCINATION STATUS: Immunization History  Administered Date(s)  Administered  . H1N1 12/28/2008  . Influenza Split 10/01/2011, 10/09/2012  . Influenza Whole 08/13/2007, 09/06/2008, 08/24/2009, 11/21/2010  . Influenza,inj,Quad PF,6+ Mos 09/10/2013, 08/11/2014, 08/02/2015, 09/13/2016, 10/29/2017  . PPD Test 06/09/2013  . Pneumococcal Conjugate-13 04/14/2015  . Pneumococcal Polysaccharide-23 10/25/2004, 09/10/2013  . Td 12/04/1995, 08/24/2009  . Tdap 12/25/2012    Diabetes She presents for her initial diabetic visit. She has type 2 diabetes mellitus. Onset time: She was diagnosed at approximate age of 63 years. Her disease course has been worsening. There are no hypoglycemic associated symptoms. Pertinent negatives for hypoglycemia include no confusion, headaches, pallor or seizures. Associated symptoms include blurred vision, fatigue, polydipsia and polyuria. Pertinent negatives for diabetes include no chest pain and no polyphagia. There are no hypoglycemic complications. Symptoms are worsening. There are no diabetic complications. Risk factors for coronary artery disease include diabetes mellitus, post-menopausal, sedentary lifestyle and dyslipidemia. Current diabetic treatments: She is taking metformin 500 mg p.o. daily.  She was supposed to take  Januvia, however is not taking it. Her weight is increasing steadily. She is following a generally unhealthy diet. When asked about meal planning, she reported none. She has not had a previous visit with a dietitian. (She did not present any meter nor logs to review.  Her most recent A1c was found to be greater than 15.5%. The prior to measurements are 13.6%, and 14%.) An ACE inhibitor/angiotensin II receptor blocker is not being taken. Eye exam is not current.  Hyperlipidemia This is a chronic problem. The current episode started more than 1 year ago. Exacerbating diseases include diabetes. Pertinent negatives include no chest pain, myalgias or shortness of breath. She is currently on no antihyperlipidemic treatment.  Risk factors for coronary artery disease include dyslipidemia, diabetes mellitus, a sedentary lifestyle, post-menopausal and family history.     Review of Systems  Constitutional: Positive for fatigue. Negative for chills, fever and unexpected weight change.  HENT: Negative for trouble swallowing and voice change.   Eyes: Positive for blurred vision. Negative for visual disturbance.  Respiratory: Negative for cough, shortness of breath and wheezing.   Cardiovascular: Negative for chest pain, palpitations and leg swelling.  Gastrointestinal: Negative for diarrhea, nausea and vomiting.  Endocrine: Positive for polydipsia and  polyuria. Negative for cold intolerance, heat intolerance and polyphagia.  Musculoskeletal: Negative for arthralgias and myalgias.  Skin: Negative for color change, pallor, rash and wound.  Neurological: Negative for seizures and headaches.  Psychiatric/Behavioral: Negative for confusion and suicidal ideas.    Objective:    BP 117/79   Pulse 86   Ht 5\' 4"  (1.626 m)   Wt 158 lb (71.7 kg)   BMI 27.12 kg/m   Wt Readings from Last 3 Encounters:  08/05/19 158 lb (71.7 kg)  07/27/19 150 lb (68 kg)  07/23/19 154 lb 1.9 oz (69.9 kg)     Physical Exam Constitutional:      Appearance: She is well-developed.  HENT:     Head: Normocephalic and atraumatic.  Neck:     Musculoskeletal: Normal range of motion and neck supple.     Thyroid: No thyromegaly.     Trachea: No tracheal deviation.  Cardiovascular:     Rate and Rhythm: Normal rate.  Pulmonary:     Effort: Pulmonary effort is normal.  Abdominal:     Tenderness: There is no abdominal tenderness. There is no guarding.  Musculoskeletal: Normal range of motion.  Skin:    General: Skin is warm and dry.     Coloration: Skin is not pale.     Findings: No erythema or rash.  Neurological:     Mental Status: She is alert and oriented to person, place, and time.     Cranial Nerves: No cranial nerve deficit.      Coordination: Coordination normal.     Deep Tendon Reflexes: Reflexes are normal and symmetric.  Psychiatric:        Judgment: Judgment normal.     Comments: Noncompliant, unconcerned affect.  Patient is in denial of her diabetes.  She appears to minimize her symptoms from hypoglycemia.     CMP ( most recent) CMP     Component Value Date/Time   NA 123 (L) 07/23/2019 1825   K 4.7 07/23/2019 1825   CL 87 (L) 07/23/2019 1825   CO2 22 07/23/2019 1825   GLUCOSE 971 (HH) 07/23/2019 1825   BUN 20 07/23/2019 1825   CREATININE 0.82 07/23/2019 1825   CREATININE 0.56 10/28/2017 0854   CALCIUM 8.9 07/23/2019 1825   PROT 7.9 07/23/2019 1616   ALBUMIN 3.9 07/23/2019 1616   AST 32 07/23/2019 1616   ALT 53 (H) 07/23/2019 1616   ALKPHOS 107 07/23/2019 1616   BILITOT 0.6 07/23/2019 1616   GFRNONAA >60 07/23/2019 1825   GFRNONAA 100 10/28/2017 0854   GFRAA >60 07/23/2019 1825   GFRAA 116 10/28/2017 0854     Diabetic Labs (most recent): Lab Results  Component Value Date   HGBA1C >15.5 (H) 07/23/2019   HGBA1C 13.6 (H) 10/28/2017   HGBA1C 14.0 (H) 07/19/2017     Lipid Panel ( most recent) Lipid Panel     Component Value Date/Time   CHOL 198 10/28/2017 0854   TRIG 206 (H) 10/28/2017 0854   HDL 33 (L) 10/28/2017 0854   CHOLHDL 6.0 (H) 10/28/2017 0854   VLDL 22 07/19/2017 1102   LDLCALC 131 (H) 10/28/2017 0854   LDLCALC 94 12/07/2014      Lab Results  Component Value Date   TSH 0.86 02/18/2017   TSH 2.126 04/14/2015   TSH 2.070 04/14/2014   TSH 2.190 04/15/2013   TSH 1.638 10/01/2011   TSH 1.831 03/09/2010   TSH 2.191 12/28/2008      Assessment & Plan:   1.  Uncontrolled type 2 diabetes mellitus with hyperglycemia ([place])  - DAZIYAH HUY has currently uncontrolled symptomatic type 2 DM since  63 years of age,  with most recent A1c of >15.5 %. Recent labs reviewed.  On July 23, 2019 her CMP showed glucose of 971. - I had a long discussion with her about the  progressive nature of diabetes and the pathology behind its complications. -her diabetes is complicated by noncompliance and she remains at a high risk for more acute and chronic complications which include CAD, CVA, CKD, retinopathy, and neuropathy. These are all discussed in detail with her.  - I have counseled her on diet  and weight management  by adopting a carbohydrate restricted/protein rich diet. Patient is encouraged to switch to  unprocessed or minimally processed     complex starch and increased protein intake (animal or plant source), fruits, and vegetables. -  she is advised to stick to a routine mealtimes to eat 3 meals  a day and avoid unnecessary snacks ( to snack only to correct hypoglycemia).   - she admits that there is a room for improvement in her food and drink choices. - Suggestion is made for her to avoid simple carbohydrates  from her diet including Cakes, Sweet Desserts, Ice Cream, Soda (diet and regular), Sweet Tea, Candies, Chips, Cookies, Store Bought Juices, Alcohol in Excess of  1-2 drinks a day, Artificial Sweeteners,  Coffee Creamer, and "Sugar-free" Products. This will help patient to have more stable blood glucose profile and potentially avoid unintended weight gain.  - she will be scheduled with Jearld Fenton, RDN, CDE for diabetes education.  -Given her current and prevailing glycemic burden, she is an immediate candidate for insulin treatment, however tragically she declined his offer at this time. -She states that she has "no symptoms from her diabetes whatsoever" at this time. - she agrees to resume Januvia 50 mg p.o. daily, continue metformin 500 mg p.o. twice daily, and added glipizide 5 mg XL p.o. daily at breakfast.    -She is also approached to start monitoring blood glucose 4 times a day-before meals and at bedtime and will be engaged over the phone in 10 days.  She accepts this plan reluctantly.  - she is encouraged to call clinic for blood glucose  levels less than 70 or above 300 mg /dl.  - Specific targets for  A1c;  LDL, HDL, Triglycerides, and  Waist Circumference were discussed with the patient.  2) Blood Pressure /Hypertension:  her blood pressure is controlled to target.  She is not on antihypertensive medication at this time.   3) Lipids/Hyperlipidemia: Review of her recent lipid panel showed hypertriglyceridemia and high LDL.  She does not want to start statin treatment, she is on different herbal medications for high cholesterol.    4)  Weight/Diet:  Body mass index is 27.12 kg/m.  -   I discussed with her the fact that loss of 5 - 10% of her  current body weight will have the most impact on her diabetes management.  Exercise, and detailed carbohydrates information provided  -  detailed on discharge instructions.  5) Chronic Care/Health Maintenance:  -she  Is not on ACEI/ARB and Statin medications and  is encouraged to initiate and continue to follow up with Ophthalmology, Dentist,  Podiatrist at least yearly or according to recommendations, and advised to   stay away from smoking. I have recommended yearly flu vaccine and pneumonia vaccine at least every 5 years; moderate intensity exercise  for up to 150 minutes weekly; and  sleep for at least 7 hours a day.  - she is  advised to maintain close follow up with [name], MD for primary care needs, as well as her other providers for optimal and coordinated care.  - Time spent with the patient: 45 minutes, of which >50% was spent in obtaining information about her symptoms, reviewing her previous labs/studies, evaluations, and treatments, counseling her about her currently uncontrolled type 2 diabetes, hyperlipidemia, and developing plans for long term treatment based on the latest standards of care/guidelines.  Please refer to " Patient Self Inventory" in the Media  tab for reviewed elements of pertinent patient history.  Coralie Common participated in the discussions,  expressed understanding, and voiced agreement with the above plans.  All questions were answered to her satisfaction. she is encouraged to contact clinic should she have any questions or concerns prior to her return visit.  Follow up plan: - Return in about 10 days (around 08/15/2019) for Follow up with Meter and Logs Only - no Labs.  Glade Lloyd, MD [facility] [address] [place], [place] [address] Phone: [phone]  Fax: [phone]    08/05/2019, 5:03 PM  This note was partially dictated with voice recognition software. Similar sounding words can be transcribed inadequately or may not  be corrected upon review.

## 2019-08-17 ENCOUNTER — Other Ambulatory Visit: Payer: Self-pay

## 2019-08-17 ENCOUNTER — Encounter: Payer: Self-pay | Admitting: "Endocrinology

## 2019-08-17 ENCOUNTER — Ambulatory Visit (INDEPENDENT_AMBULATORY_CARE_PROVIDER_SITE_OTHER): Payer: Medicare Other | Admitting: "Endocrinology

## 2019-08-17 DIAGNOSIS — E1165 Type 2 diabetes mellitus with hyperglycemia: Secondary | ICD-10-CM | POA: Diagnosis not present

## 2019-08-17 DIAGNOSIS — Z9119 Patient's noncompliance with other medical treatment and regimen: Secondary | ICD-10-CM | POA: Diagnosis not present

## 2019-08-17 DIAGNOSIS — Z91199 Patient's noncompliance with other medical treatment and regimen due to unspecified reason: Secondary | ICD-10-CM

## 2019-08-17 MED ORDER — GLIPIZIDE ER 10 MG PO TB24: 10.0000 mg | ORAL_TABLET | Freq: Every day | ORAL | 3 refills | Status: DC

## 2019-08-17 MED ORDER — LANCETS MISC: 1.0000 | 3 refills | Status: DC

## 2019-08-17 NOTE — Progress Notes (Signed)
08/17/2019, 6:29 PM                                                    Endocrinology Telehealth Visit Follow up Note -During COVID -19 Pandemic  This visit type was conducted due to national recommendations for restrictions regarding the COVID-19 Pandemic  in an effort to limit this patient's exposure and mitigate transmission of the corona virus.  Due to her co-morbid illnesses, [name] is at  moderate to high risk for complications without adequate follow up.  This format is felt to be most appropriate for her at this time.  I connected with this patient on 08/17/2019   by telephone and verified that I am speaking with the correct person using two identifiers. [name], 04/01/1956. she has verbally consented to this visit. All issues noted in this document were discussed and addressed. The format was not optimal for physical exam.    Subjective:    Patient ID: [name], female    DOB: 22-Jun-1956.  [name] is being engaged in telehealth via telephone for follow-up after she was seen in consultation for management of currently uncontrolled symptomatic diabetes requested by  [name], MD.   Past Medical History:  Diagnosis Date  . Allergic rhinitis   . Anemia, deficiency   . Diabetes mellitus   . GERD (gastroesophageal reflux disease)   . Hyperlipidemia   . Hypertension   . Obesity     Past Surgical History:  Procedure Laterality Date  . EXTRACTION OF SEVENTH TEETH  2007  . REMOVAL OF GREAT TOENAIL  2005  . SPLENECTOMY  2002    Social History   Socioeconomic History  . Marital status: Divorced    Spouse name: Not on file  . Number of children: 1  . Years of education: Not on file  . Highest education level: Not on file  Occupational History  . Occupation: DISABLED   Social Needs  . Financial resource strain: Not on file  . Food insecurity    Worry: Not on  file    Inability: Not on file  . Transportation needs    Medical: Not on file    Non-medical: Not on file  Tobacco Use  . Smoking status: Never Smoker  . Smokeless tobacco: Never Used  Substance and Sexual Activity  . Alcohol use: No  . Drug use: No  . Sexual activity: Yes    Birth control/protection: Post-menopausal  Lifestyle  . Physical activity    Days per week: Not on file    Minutes per session: Not on file  . Stress: Not on file  Relationships  . Social Herbalist on phone: Not on file    Gets together: Not on file    Attends religious service: Not on file    Active member of club or organization: Not on file    Attends meetings of clubs or organizations: Not on file  Relationship status: Not on file  Other Topics Concern  . Not on file  Social History Narrative  . Not on file    Family History  Problem Relation Age of Onset  . Diabetes Mother   . Hypertension Mother   . Hyperlipidemia Mother   . Lung disease Father   . Hypertension Sister   . Hyperlipidemia Sister   . Hypertension Brother   . Sickle cell trait Son   . Arthritis Other   . Asthma Other   . Cancer Other     Outpatient Encounter Medications as of 08/17/2019  Medication Sig  . acetaminophen (TYLENOL) 500 MG tablet Take 500 mg by mouth every 6 (six) hours as needed for mild pain or moderate pain.  . Alpha-D-Galactosidase (BEANO PO) Take 1 capsule by mouth daily.  Marland Kitchen aspirin 81 MG tablet Take 162 mg by mouth daily.   . Calcium-Vitamin D (SUPER CALCIUM/D) 600-125 MG-UNIT TABS Take 1 tablet by mouth daily.   Marland Kitchen Cod Liver Oil CAPS Take 1 capsule by mouth daily.   Marland Kitchen Echinacea-Goldenseal (ECHINACEA COMB/GOLDEN SEAL PO) Take by mouth.  . Garlic Oil [id number] MG CAPS Take 1 capsule by mouth daily.   Marland Kitchen glipiZIDE (GLUCOTROL XL) 10 MG 24 hr tablet Take 1 tablet (10 mg total) by mouth daily with breakfast.  . Lancets MISC 1 each by Does not apply route as directed.  . metFORMIN (GLUCOPHAGE) 500 MG  tablet Take 1 tablet (500 mg total) by mouth 2 (two) times daily with a meal for 60 doses.  . Multiple Vitamins-Minerals (MULTIPLE VITAMINS/WOMENS) tablet Take 1 tablet by mouth daily.    . naproxen (NAPROSYN) 500 MG tablet Take 1 tablet (500 mg total) by mouth 2 (two) times daily. Limit use to 3-5 days  . OVER THE COUNTER MEDICATION Take 1 capsule by mouth daily. Leafy Half  . OVER THE COUNTER MEDICATION Take 200 mcg by mouth daily. Chromium  . OVER THE COUNTER MEDICATION Take 4 capsules by mouth daily. Colostrum  . Turmeric 500 MG TABS Take 2 tablets by mouth 3 (three) times daily.  . [DISCONTINUED] glipiZIDE (GLUCOTROL XL) 5 MG 24 hr tablet Take 1 tablet (5 mg total) by mouth daily with breakfast.  . [DISCONTINUED] sitaGLIPtin (JANUVIA) 50 MG tablet Take 1 tablet (50 mg total) by mouth daily.   No facility-administered encounter medications on file as of 08/17/2019.     ALLERGIES: Allergies  Allergen Reactions  . Codeine     VACCINATION STATUS: Immunization History  Administered Date(s) Administered  . H1N1 12/28/2008  . Influenza Split 10/01/2011, 10/09/2012  . Influenza Whole 08/13/2007, 09/06/2008, 08/24/2009, 11/21/2010  . Influenza,inj,Quad PF,6+ Mos 09/10/2013, 08/11/2014, 08/02/2015, 09/13/2016, 10/29/2017  . PPD Test 06/09/2013  . Pneumococcal Conjugate-13 04/14/2015  . Pneumococcal Polysaccharide-23 10/25/2004, 09/10/2013  . Td 12/04/1995, 08/24/2009  . Tdap 12/25/2012    Diabetes She presents for her follow-up diabetic visit. She has type 2 diabetes mellitus. Onset time: She was diagnosed at approximate age of 63 years. Her disease course has been improving. There are no hypoglycemic associated symptoms. Pertinent negatives for hypoglycemia include no confusion, headaches, pallor or seizures. Associated symptoms include blurred vision, fatigue, polydipsia and polyuria. Pertinent negatives for diabetes include no chest pain and no polyphagia. There are no hypoglycemic  complications. Symptoms are improving. There are no diabetic complications. Risk factors for coronary artery disease include diabetes mellitus, post-menopausal, sedentary lifestyle and dyslipidemia. Current diabetic treatments: She is taking metformin 500 mg p.o. daily.  She was supposed to  take  Januvia, however is not taking it. Her weight is increasing steadily. She is following a generally unhealthy diet. When asked about meal planning, she reported none. She has not had a previous visit with a dietitian. Her breakfast blood glucose range is generally >200 mg/dl. Her lunch blood glucose range is generally >200 mg/dl. Her dinner blood glucose range is generally >200 mg/dl. Her bedtime blood glucose range is generally >200 mg/dl. Her overall blood glucose range is >200 mg/dl. (She reports glycemic profile between 210-269 fasting, 209-220 prelunch, 215-291 presupper, 227-367 bedtime.  Her most recent A1c was found to be greater than 15.5%. The prior to measurements are 13.6%, and 14%.) An ACE inhibitor/angiotensin II receptor blocker is not being taken. Eye exam is not current.  Hyperlipidemia This is a chronic problem. The current episode started more than 1 year ago. Exacerbating diseases include diabetes. Pertinent negatives include no chest pain, myalgias or shortness of breath. She is currently on no antihyperlipidemic treatment. Risk factors for coronary artery disease include dyslipidemia, diabetes mellitus, a sedentary lifestyle, post-menopausal and family history.   Review of systems: Limited as above.  Objective:    There were no vitals taken for this visit.  Wt Readings from Last 3 Encounters:  08/05/19 158 lb (71.7 kg)  07/27/19 150 lb (68 kg)  07/23/19 154 lb 1.9 oz (69.9 kg)     Physical Exam Constitutional:      Appearance: She is well-developed.  HENT:     Head: Normocephalic and atraumatic.  Neck:     Musculoskeletal: Normal range of motion and neck supple.     Thyroid: No  thyromegaly.     Trachea: No tracheal deviation.  Cardiovascular:     Rate and Rhythm: Normal rate.  Pulmonary:     Effort: Pulmonary effort is normal.  Abdominal:     Tenderness: There is no abdominal tenderness. There is no guarding.  Musculoskeletal: Normal range of motion.  Skin:    General: Skin is warm and dry.     Coloration: Skin is not pale.     Findings: No erythema or rash.  Neurological:     Mental Status: She is alert and oriented to person, place, and time.     Cranial Nerves: No cranial nerve deficit.     Coordination: Coordination normal.     Deep Tendon Reflexes: Reflexes are normal and symmetric.  Psychiatric:        Judgment: Judgment normal.     Comments: Noncompliant, unconcerned affect.  Patient is in denial of her diabetes.  She appears to minimize her symptoms from hypoglycemia.     CMP ( most recent) CMP     Component Value Date/Time   NA 123 (L) 07/23/2019 1825   K 4.7 07/23/2019 1825   CL 87 (L) 07/23/2019 1825   CO2 22 07/23/2019 1825   GLUCOSE 971 (HH) 07/23/2019 1825   BUN 20 07/23/2019 1825   CREATININE 0.82 07/23/2019 1825   CREATININE 0.56 10/28/2017 0854   CALCIUM 8.9 07/23/2019 1825   PROT 7.9 07/23/2019 1616   ALBUMIN 3.9 07/23/2019 1616   AST 32 07/23/2019 1616   ALT 53 (H) 07/23/2019 1616   ALKPHOS 107 07/23/2019 1616   BILITOT 0.6 07/23/2019 1616   GFRNONAA >60 07/23/2019 1825   GFRNONAA 100 10/28/2017 0854   GFRAA >60 07/23/2019 1825   GFRAA 116 10/28/2017 0854     Diabetic Labs (most recent): Lab Results  Component Value Date   HGBA1C >15.5 (H) 07/23/2019  HGBA1C 13.6 (H) 10/28/2017   HGBA1C 14.0 (H) 07/19/2017     Lipid Panel ( most recent) Lipid Panel     Component Value Date/Time   CHOL 198 10/28/2017 0854   TRIG 206 (H) 10/28/2017 0854   HDL 33 (L) 10/28/2017 0854   CHOLHDL 6.0 (H) 10/28/2017 0854   VLDL 22 07/19/2017 1102   LDLCALC 131 (H) 10/28/2017 0854   LDLCALC 94 12/07/2014      Lab Results   Component Value Date   TSH 0.86 02/18/2017   TSH 2.126 04/14/2015   TSH 2.070 04/14/2014   TSH 2.190 04/15/2013   TSH 1.638 10/01/2011   TSH 1.831 03/09/2010   TSH 2.191 12/28/2008      Assessment & Plan:   1. Uncontrolled type 2 diabetes mellitus with hyperglycemia ([place])  - RIETA LEHN has currently uncontrolled symptomatic type 2 DM since  63 years of age,  with most recent A1c of >15.5 %.   She refused insulin treatment of her last visit.  She is responding to her glipizide addition, with average blood glucose between 250-300 mg per DL.  Recent labs reviewed.  On July 23, 2019 her CMP showed glucose of 971. - I had a long discussion with her about the progressive nature of diabetes and the pathology behind its complications. -her diabetes is complicated by noncompliance and she remains at a high risk for more acute and chronic complications which include CAD, CVA, CKD, retinopathy, and neuropathy. These are all discussed in detail with her.  - I have counseled her on diet  and weight management  by adopting a carbohydrate restricted/protein rich diet. Patient is encouraged to switch to  unprocessed or minimally processed     complex starch and increased protein intake (animal or plant source), fruits, and vegetables. -  she is advised to stick to a routine mealtimes to eat 3 meals  a day and avoid unnecessary snacks ( to snack only to correct hypoglycemia).    - she  admits there is a room for improvement in her diet and drink choices. -  Suggestion is made for her to avoid simple carbohydrates  from her diet including Cakes, Sweet Desserts / Pastries, Ice Cream, Soda (diet and regular), Sweet Tea, Candies, Chips, Cookies, Sweet Pastries,  Store Bought Juices, Alcohol in Excess of  1-2 drinks a day, Artificial Sweeteners, Coffee Creamer, and "Sugar-free" Products. This will help patient to have stable blood glucose profile and potentially avoid unintended weight gain.  - she  will be scheduled with Jearld Fenton, RDN, CDE for diabetes education.  -Given her current and prevailing glycemic burden, she is an immediate candidate for insulin treatment, however declines treatment with insulin at this time.    - she did not afford the co-pay for Januvia, was discontinued.  She is advised to continue metformin  500 mg p.o. twice daily, and increase glipizide to 10 mg XL p.o. daily at breakfast.   -She is also approached to start monitoring blood glucose 2 times a day-before BKF and at bedtime and will be engaged over the phone in 10 days.  She accepts this plan reluctantly.  - she is encouraged to call clinic for blood glucose levels less than 70 or above 300 mg /dl.  - Specific targets for  A1c;  LDL, HDL, Triglycerides, and  Waist Circumference were discussed with the patient.  2) Blood Pressure /Hypertension:  her blood pressure is controlled to target.  She is not on antihypertensive medication  at this time.   3) Lipids/Hyperlipidemia: Review of her recent lipid panel showed hypertriglyceridemia and high LDL.  She does not want to start statin treatment, she is on different herbal medications for high cholesterol.    4)  Weight/Diet: Her BMI is 27, not a candidate for major weight loss.  However, I discussed with her the fact that loss of 5 - 10% of her  current body weight will have the most impact on her diabetes management.  Exercise, and detailed carbohydrates information provided  -  detailed on discharge instructions.  5) Chronic Care/Health Maintenance:  -she  Is not on ACEI/ARB and Statin medications and  is encouraged to initiate and continue to follow up with Ophthalmology, Dentist,  Podiatrist at least yearly or according to recommendations, and advised to   stay away from smoking. I have recommended yearly flu vaccine and pneumonia vaccine at least every 5 years; moderate intensity exercise for up to 150 minutes weekly; and  sleep for at least 7 hours a  day.  - she is  advised to maintain close follow up with [name], MD for primary care needs, as well as her other providers for optimal and coordinated care.  - Patient Care Time Today:  25 min, of which >50% was spent in  counseling and the rest reviewing her  current and  previous labs/studies, previous treatments, her blood glucose readings, and medications' doses and developing a plan for long-term care based on the latest recommendations for standards of care.   [name] participated in the discussions, expressed understanding, and voiced agreement with the above plans.  All questions were answered to her satisfaction. she is encouraged to contact clinic should she have any questions or concerns prior to her return visit.   Follow up plan: - Return in about 3 months (around 11/16/2019) for Bring Meter and Logs- A1c in Office, Include 8 log sheets.  Glade Lloyd, MD [facility] [address] [place], [place] [address] Phone: [phone]  Fax: [phone]    08/17/2019, 6:29 PM  This note was partially dictated with voice recognition software. Similar sounding words can be transcribed inadequately or may not  be corrected upon review.

## 2019-08-19 ENCOUNTER — Other Ambulatory Visit: Payer: Self-pay

## 2019-08-19 ENCOUNTER — Other Ambulatory Visit (HOSPITAL_COMMUNITY)
Admission: RE | Admit: 2019-08-19 | Discharge: 2019-08-19 | Disposition: A | Discharge status: Home / Self Care | Payer: Medicare Other | Source: Ambulatory Visit | Attending: Family Medicine | Admitting: Family Medicine

## 2019-08-19 ENCOUNTER — Ambulatory Visit (INDEPENDENT_AMBULATORY_CARE_PROVIDER_SITE_OTHER): Payer: Medicare Other | Admitting: Family Medicine

## 2019-08-19 ENCOUNTER — Encounter: Payer: Self-pay | Admitting: Family Medicine

## 2019-08-19 VITALS — BP 118/82 | Temp 97.6°F | Resp 15 | Ht 64.0 in | Wt 156.0 lb

## 2019-08-19 DIAGNOSIS — Z1211 Encounter for screening for malignant neoplasm of colon: Secondary | ICD-10-CM | POA: Diagnosis not present

## 2019-08-19 DIAGNOSIS — Z0000 Encounter for general adult medical examination without abnormal findings: Secondary | ICD-10-CM

## 2019-08-19 DIAGNOSIS — Z1151 Encounter for screening for human papillomavirus (HPV): Secondary | ICD-10-CM | POA: Insufficient documentation

## 2019-08-19 DIAGNOSIS — E782 Mixed hyperlipidemia: Secondary | ICD-10-CM | POA: Diagnosis not present

## 2019-08-19 DIAGNOSIS — Z124 Encounter for screening for malignant neoplasm of cervix: Secondary | ICD-10-CM | POA: Diagnosis not present

## 2019-08-19 DIAGNOSIS — E1165 Type 2 diabetes mellitus with hyperglycemia: Secondary | ICD-10-CM

## 2019-08-19 MED ORDER — RAMIPRIL 2.5 MG PO CAPS: 2.5000 mg | ORAL_CAPSULE | Freq: Every day | ORAL | 5 refills | Status: DC

## 2019-08-19 MED ORDER — ROSUVASTATIN CALCIUM 5 MG PO TABS: 5.0000 mg | ORAL_TABLET | Freq: Every day | ORAL | 3 refills | Status: DC

## 2019-08-19 NOTE — Patient Instructions (Addendum)
F/U in office with MD in 5 months, call if you need me sooner  Please schedule flu vaccine at checkout if she  will commit  Pap sent today  You are referred to Dr Sydell Axon for your colonoscopy, his office will call you  You are referred for eye exam , need this vision is not good as it should be, this will improve when diabetes improves also  New medication, ramipril for kidney protection one every morning Crestor 5 mg one at night for heart protection  Take aspirin 81 mg oNE daily  Please get fasting lipid , cmp and EGFR, when you next get labs for Dr Dorris Fetch

## 2019-08-19 NOTE — Assessment & Plan Note (Signed)

## 2019-08-19 NOTE — Assessment & Plan Note (Addendum)
Managed by endo, uncontrolled, reports improvement [name] is reminded of the importance of commitment to daily physical activity for 30 minutes or more, as able and the need to limit carbohydrate intake to 30 to 60 grams per meal to help with blood sugar control.   The need to take medication as prescribed, test blood sugar as directed, and to call between visits if there is a concern that blood sugar is uncontrolled is also discussed.   [name] is reminded of the importance of daily foot exam, annual eye examination, and good blood sugar, blood pressure and cholesterol control.  Diabetic Labs Latest Ref Rng & Units 07/24/2019 07/23/2019 07/23/2019 10/30/2017 10/28/2017  HbA1c 4.8 - 5.6 % - - >15.5(H) - 13.6(H)  Microalbumin Not Estab. ug/mL <3.0(H) - - 3.9 -  Micro/Creat Ratio 0 - 29 mg/g creat <28 - - 36(H) -  Chol <200 mg/dL - - - - 198  HDL >50 mg/dL - - - - 33(L)  Calc LDL mg/dL (calc) - - - - 131(H)  Triglycerides <150 mg/dL - - - - 206(H)  Creatinine 0.44 - 1.00 mg/dL - 0.82 0.70 - 0.56   BP/Weight 08/19/2019 08/05/2019 07/28/2019 07/27/2019 07/23/2019 07/23/2019 [id number]  Systolic BP 123456 [id number] - [id number]  Diastolic BP 82 79 78 - 84 85 67  Wt. (Lbs) 156 158 - 150 - 154.12 153  BMI 26.78 27.12 - 26.57 - 26.45 26.26   Foot/eye exam completion dates Latest Ref Rng & Units 08/19/2019 10/29/2017  Eye Exam No Retinopathy - -  Foot exam Order - - -  Foot Form Completion - Done Done   Start aspirin, ACE and statin, educated re the importance of each

## 2019-08-23 ENCOUNTER — Encounter: Payer: Self-pay | Admitting: Family Medicine

## 2019-08-23 NOTE — Progress Notes (Signed)
[name]     MRN: [id number]      DOB: 1956/01/29  HPI: Patient is in for annual physical exam. No other health concerns are expressed or addressed at the visit.  Immunization is reviewed , and  updated    PE: BP 118/82   Temp 97.6 F (36.4 C) (Temporal)   Resp 15   Ht 5\' 4"  (1.626 m)   Wt 156 lb (70.8 kg)   SpO2 96%   BMI 26.78 kg/m   Pleasant  female, alert and oriented x 3, in no cardio-pulmonary distress. Afebrile. HEENT No facial trauma or asymetry. Sinuses non tender.  Extra occullar muscles intact,  External ears normal,. Neck: supple, no adenopathy,JVD or thyromegaly.No bruits.  Chest: Clear to ascultation bilaterally.No crackles or wheezes. Non tender to palpation  Breast: Not examined, patient refuses, also continues to refuse mammograms, has presumptive dx of breast cancer which she will not confirm and treat for over 4 years  Cardiovascular system; Heart sounds normal,  S1 and  S2 ,no S3.  No murmur, or thrill. Apical beat not displaced Peripheral pulses normal.  Abdomen: Soft, non tender, no organomegaly or masses. No bruits. Bowel sounds normal. No guarding, tenderness or rebound.    GU: External genitalia normal female genitalia , normal female distribution of hair. No lesions. Urethral meatus normal in size, no  Prolapse, no lesions visibly  Present. Bladder non tender. Vagina pink and moist , with no visible lesions , discharge present . Adequate pelvic support no  cystocele or rectocele noted Cervix pink and appears healthy, no lesions or ulcerations noted, no discharge noted from os Uterus normal size, no adnexal masses, no cervical motion or adnexal tenderness.   Musculoskeletal exam: Full ROM of spine, hips , shoulders and knees. No deformity ,swelling or crepitus noted. No muscle wasting or atrophy.   Neurologic: Cranial nerves 2 to 12 intact. Power, tone ,sensation and reflexes normal throughout. No disturbance in  gait. No tremor.  Skin: Intact, no ulceration, erythema , scaling or rash noted. Pigmentation normal throughout  Psych; Normal mood and affect. l   Assessment & Plan:  Annual physical exam Annual exam as documented. Counseling done  re healthy lifestyle involving commitment to 150 minutes exercise per week, heart healthy diet, and attaining healthy weight.The importance of adequate sleep also discussed. Regular seat belt use and home safety, is also discussed. Changes in health habits are decided on by the patient with goals and time frames  set for achieving them. Immunization and cancer screening needs are specifically addressed at this visit.   Uncontrolled type 2 diabetes mellitus with hyperglycemia ([facility]) [facility] Managed by endo, uncontrolled, reports improvement [name] is reminded of the importance of commitment to daily physical activity for 30 minutes or more, as able and the need to limit carbohydrate intake to 30 to 60 grams per meal to help with blood sugar control.   The need to take medication as prescribed, test blood sugar as directed, and to call between visits if there is a concern that blood sugar is uncontrolled is also discussed.   [name] is reminded of the importance of daily foot exam, annual eye examination, and good blood sugar, blood pressure and cholesterol control.  Diabetic Labs Latest Ref Rng & Units 07/24/2019 07/23/2019 07/23/2019 10/30/2017 10/28/2017  HbA1c 4.8 - 5.6 % - - >15.5(H) - 13.6(H)  Microalbumin Not Estab. ug/mL <3.0(H) - - 3.9 -  Micro/Creat Ratio 0 - 29 mg/g creat <28 - -  36(H) -  Chol <200 mg/dL - - - - 198  HDL >50 mg/dL - - - - 33(L)  Calc LDL mg/dL (calc) - - - - 131(H)  Triglycerides <150 mg/dL - - - - 206(H)  Creatinine 0.44 - 1.00 mg/dL - 0.82 0.70 - 0.56   BP/Weight 08/19/2019 08/05/2019 07/28/2019 07/27/2019 07/23/2019 07/23/2019 [id number]  Systolic BP 123456 [id number] - [id number]  Diastolic BP 82 79 78 - 84 85 67  Wt. (Lbs) 156 158 -  150 - 154.12 153  BMI 26.78 27.12 - 26.57 - 26.45 26.26   Foot/eye exam completion dates Latest Ref Rng & Units 08/19/2019 10/29/2017  Eye Exam No Retinopathy - -  Foot exam Order - - -  Foot Form Completion - Done Done

## 2019-08-31 LAB — CYTOLOGY - PAP
Diagnosis: NEGATIVE
High risk HPV: NEGATIVE
Molecular Disclaimer: 56
Molecular Disclaimer: NORMAL

## 2019-09-01 ENCOUNTER — Encounter: Payer: Self-pay | Admitting: Internal Medicine

## 2019-09-03 ENCOUNTER — Ambulatory Visit: Payer: Medicare Other | Admitting: Nutrition

## 2019-09-25 ENCOUNTER — Other Ambulatory Visit: Payer: Self-pay

## 2019-09-25 NOTE — Patient Outreach (Signed)
[facility]) Care Management  09/25/2019  JAILEN WHITON [birth year] [id number]   Medication Adherence call to Mrs. [name]  Spoke with patient she is past due on Ramipril 2.5 mg patient ask to call her at a later time or next week. Mrs. Steinbeck is showing past due under [place].   [facility] Management Direct Dial [phone]  Fax [phone] [email or web address]

## 2019-09-29 ENCOUNTER — Ambulatory Visit: Payer: Medicare Other | Admitting: Nutrition

## 2019-10-05 ENCOUNTER — Ambulatory Visit (INDEPENDENT_AMBULATORY_CARE_PROVIDER_SITE_OTHER): Payer: Medicare Other | Admitting: "Endocrinology

## 2019-10-05 ENCOUNTER — Other Ambulatory Visit: Payer: Self-pay

## 2019-10-05 ENCOUNTER — Encounter: Payer: Self-pay | Admitting: "Endocrinology

## 2019-10-05 VITALS — BP 138/72 | HR 76 | Ht 64.0 in | Wt 149.0 lb

## 2019-10-05 DIAGNOSIS — E1165 Type 2 diabetes mellitus with hyperglycemia: Secondary | ICD-10-CM

## 2019-10-05 NOTE — Progress Notes (Signed)
10/05/2019, 5:20 PM   Endocrinology follow-up note    Subjective:    Patient ID: [name], female    DOB: 1956/05/30.  [name] is here for foot exam, and  for follow-up after she was seen in consultation for management of currently uncontrolled symptomatic diabetes requested by  [name], MD.   Past Medical History:  Diagnosis Date  . Allergic rhinitis   . Anemia, deficiency   . Diabetes mellitus   . GERD (gastroesophageal reflux disease)   . Hyperlipidemia   . Hypertension   . Obesity     Past Surgical History:  Procedure Laterality Date  . EXTRACTION OF SEVENTH TEETH  2007  . REMOVAL OF GREAT TOENAIL  2005  . SPLENECTOMY  2002    Social History   Socioeconomic History  . Marital status: Divorced    Spouse name: Not on file  . Number of children: 1  . Years of education: Not on file  . Highest education level: Not on file  Occupational History  . Occupation: DISABLED   Social Needs  . Financial resource strain: Not on file  . Food insecurity    Worry: Not on file    Inability: Not on file  . Transportation needs    Medical: Not on file    Non-medical: Not on file  Tobacco Use  . Smoking status: Never Smoker  . Smokeless tobacco: Never Used  Substance and Sexual Activity  . Alcohol use: No  . Drug use: No  . Sexual activity: Yes    Birth control/protection: Post-menopausal  Lifestyle  . Physical activity    Days per week: Not on file    Minutes per session: Not on file  . Stress: Not on file  Relationships  . Social Herbalist on phone: Not on file    Gets together: Not on file    Attends religious service: Not on file    Active member of club or organization: Not on file    Attends meetings of clubs or organizations: Not on file    Relationship status: Not on file  Other Topics Concern  . Not on file  Social History Narrative  .  Not on file    Family History  Problem Relation Age of Onset  . Diabetes Mother   . Hypertension Mother   . Hyperlipidemia Mother   . Lung disease Father   . Hypertension Sister   . Hyperlipidemia Sister   . Hypertension Brother   . Sickle cell trait Son   . Arthritis Other   . Asthma Other   . Cancer Other     Outpatient Encounter Medications as of 10/05/2019  Medication Sig  . acetaminophen (TYLENOL) 500 MG tablet Take 500 mg by mouth every 6 (six) hours as needed for mild pain or moderate pain.  . Alpha-D-Galactosidase (BEANO PO) Take 1 capsule by mouth daily.  Marland Kitchen aspirin 81 MG tablet Take 162 mg by mouth daily.   . Calcium-Vitamin D (SUPER CALCIUM/D) 600-125 MG-UNIT TABS Take 1 tablet by mouth daily.   Marland Kitchen Cod Liver Oil CAPS Take 1 capsule by  mouth daily.   Marland Kitchen Echinacea-Goldenseal (ECHINACEA COMB/GOLDEN SEAL PO) Take by mouth.  . Garlic Oil [id number] MG CAPS Take 1 capsule by mouth daily.   Marland Kitchen glipiZIDE (GLUCOTROL XL) 10 MG 24 hr tablet Take 1 tablet (10 mg total) by mouth daily with breakfast.  . Lancets MISC 1 each by Does not apply route as directed.  . metFORMIN (GLUCOPHAGE) 500 MG tablet Take 1 tablet (500 mg total) by mouth 2 (two) times daily with a meal for 60 doses.  . Multiple Vitamins-Minerals (MULTIPLE VITAMINS/WOMENS) tablet Take 1 tablet by mouth daily.    . naproxen (NAPROSYN) 500 MG tablet Take 1 tablet (500 mg total) by mouth 2 (two) times daily. Limit use to 3-5 days (Patient not taking: Reported on 08/19/2019)  . OVER THE COUNTER MEDICATION Take 1 capsule by mouth daily. Leafy Half  . OVER THE COUNTER MEDICATION Take 200 mcg by mouth daily. Chromium  . OVER THE COUNTER MEDICATION Take 4 capsules by mouth daily. Colostrum  . ramipril (ALTACE) 2.5 MG capsule Take 1 capsule (2.5 mg total) by mouth daily.  . rosuvastatin (CRESTOR) 5 MG tablet Take 1 tablet (5 mg total) by mouth daily.  . Turmeric 500 MG TABS Take 2 tablets by mouth 3 (three) times daily.   No  facility-administered encounter medications on file as of 10/05/2019.     ALLERGIES: Allergies  Allergen Reactions  . Codeine     VACCINATION STATUS: Immunization History  Administered Date(s) Administered  . H1N1 12/28/2008  . Influenza Split 10/01/2011, 10/09/2012  . Influenza Whole 08/13/2007, 09/06/2008, 08/24/2009, 11/21/2010  . Influenza,inj,Quad PF,6+ Mos 09/10/2013, 08/11/2014, 08/02/2015, 09/13/2016, 10/29/2017  . PPD Test 06/09/2013  . Pneumococcal Conjugate-13 04/14/2015  . Pneumococcal Polysaccharide-23 10/25/2004, 09/10/2013  . Td 12/04/1995, 08/24/2009  . Tdap 12/25/2012    Diabetes She presents for her follow-up diabetic visit. She has type 2 diabetes mellitus. Onset time: She was diagnosed at approximate age of 36 years. There are no hypoglycemic associated symptoms. Pertinent negatives for hypoglycemia include no confusion, headaches, pallor or seizures. Associated symptoms include blurred vision, fatigue, polydipsia and polyuria. Pertinent negatives for diabetes include no chest pain and no polyphagia. There are no hypoglycemic complications. There are no diabetic complications. Risk factors for coronary artery disease include diabetes mellitus, post-menopausal, sedentary lifestyle and dyslipidemia. Current diabetic treatments: She is taking metformin 500 mg p.o. daily.  She was supposed to take  Januvia, however is not taking it. Her weight is decreasing steadily. She is following a generally unhealthy diet. When asked about meal planning, she reported none. She has not had a previous visit with a dietitian. (She did not bring any logs nor meter to review today.  Her most recent A1c was  > 15.5%.  She refused insulin treatment and currently on Metformin and glipizide only.     She is here for full exam so she can get a pair of diabetic shoes.   ) An ACE inhibitor/angiotensin II receptor blocker is not being taken. Eye exam is not current.  Hyperlipidemia This is a  chronic problem. The current episode started more than 1 year ago. Exacerbating diseases include diabetes. Pertinent negatives include no chest pain, myalgias or shortness of breath. She is currently on no antihyperlipidemic treatment. Risk factors for coronary artery disease include dyslipidemia, diabetes mellitus, a sedentary lifestyle, post-menopausal and family history.   Review of systems: Limited as above.  Objective:    BP 138/72   Pulse 76   Ht 5\' 4"  (1.626 m)  Wt 149 lb (67.6 kg)   BMI 25.58 kg/m   Wt Readings from Last 3 Encounters:  10/05/19 149 lb (67.6 kg)  08/19/19 156 lb (70.8 kg)  08/05/19 158 lb (71.7 kg)     Physical Exam Constitutional:      Appearance: She is well-developed.  HENT:     Head: Normocephalic and atraumatic.  Neck:     Musculoskeletal: Normal range of motion and neck supple.     Thyroid: No thyromegaly.     Trachea: No tracheal deviation.  Cardiovascular:     Rate and Rhythm: Normal rate.  Pulmonary:     Effort: Pulmonary effort is normal.  Abdominal:     Tenderness: There is no abdominal tenderness. There is no guarding.  Musculoskeletal: Normal range of motion.     Comments: Foot exam: sensation intact to light touch, no lesions or deformities. Good pedal pulses. Normal response to monofilament tests.    Skin:    General: Skin is warm and dry.     Coloration: Skin is not pale.     Findings: No erythema or rash.  Neurological:     Mental Status: She is alert and oriented to person, place, and time.     Cranial Nerves: No cranial nerve deficit.     Coordination: Coordination normal.     Deep Tendon Reflexes: Reflexes are normal and symmetric.  Psychiatric:     Comments: Noncompliant, unconcerned affect.  Patient is in denial of her diabetes.  She appears to minimize her symptoms from hypoglycemia.     CMP ( most recent) CMP     Component Value Date/Time   NA 123 (L) 07/23/2019 1825   K 4.7 07/23/2019 1825   CL 87 (L) 07/23/2019  1825   CO2 22 07/23/2019 1825   GLUCOSE 971 (HH) 07/23/2019 1825   BUN 20 07/23/2019 1825   CREATININE 0.82 07/23/2019 1825   CREATININE 0.56 10/28/2017 0854   CALCIUM 8.9 07/23/2019 1825   PROT 7.9 07/23/2019 1616   ALBUMIN 3.9 07/23/2019 1616   AST 32 07/23/2019 1616   ALT 53 (H) 07/23/2019 1616   ALKPHOS 107 07/23/2019 1616   BILITOT 0.6 07/23/2019 1616   GFRNONAA >60 07/23/2019 1825   GFRNONAA 100 10/28/2017 0854   GFRAA >60 07/23/2019 1825   GFRAA 116 10/28/2017 0854     Diabetic Labs (most recent): Lab Results  Component Value Date   HGBA1C >15.5 (H) 07/23/2019   HGBA1C 13.6 (H) 10/28/2017   HGBA1C 14.0 (H) 07/19/2017     Lipid Panel ( most recent) Lipid Panel     Component Value Date/Time   CHOL 198 10/28/2017 0854   TRIG 206 (H) 10/28/2017 0854   HDL 33 (L) 10/28/2017 0854   CHOLHDL 6.0 (H) 10/28/2017 0854   VLDL 22 07/19/2017 1102   LDLCALC 131 (H) 10/28/2017 0854   LDLCALC 94 12/07/2014      Lab Results  Component Value Date   TSH 0.86 02/18/2017   TSH 2.126 04/14/2015   TSH 2.070 04/14/2014   TSH 2.190 04/15/2013   TSH 1.638 10/01/2011   TSH 1.831 03/09/2010   TSH 2.191 12/28/2008      Assessment & Plan:   1. Uncontrolled type 2 diabetes mellitus with hyperglycemia ([place])  - [name] has currently uncontrolled symptomatic type 2 DM since  63 years of age,  with most recent A1c of >15.5 %.   She refused insulin treatment of her last visit.  She did not bring  any logs nor meter to review.  -She is here for foot exam so she can get a pair of diabetic shoes.  However she did not control her diabetes to target. - I had a long discussion with her about the progressive nature of diabetes and the pathology behind its complications. -her diabetes is complicated by noncompliance and she remains at a high risk for more acute and chronic complications which include CAD, CVA, CKD, retinopathy, and neuropathy. These are all discussed in detail with  her.  - I have counseled her on diet  and weight management  by adopting a carbohydrate restricted/protein rich diet. Patient is encouraged to switch to  unprocessed or minimally processed     complex starch and increased protein intake (animal or plant source), fruits, and vegetables. -  she is advised to stick to a routine mealtimes to eat 3 meals  a day and avoid unnecessary snacks ( to snack only to correct hypoglycemia).    - she  admits there is a room for improvement in her diet and drink choices. -  Suggestion is made for her to avoid simple carbohydrates  from her diet including Cakes, Sweet Desserts / Pastries, Ice Cream, Soda (diet and regular), Sweet Tea, Candies, Chips, Cookies, Sweet Pastries,  Store Bought Juices, Alcohol in Excess of  1-2 drinks a day, Artificial Sweeteners, Coffee Creamer, and "Sugar-free" Products. This will help patient to have stable blood glucose profile and potentially avoid unintended weight gain.  -Given her current and prevailing glycemic burden, she is an immediate candidate for insulin treatment, however still declines treatment with insulin at this time.    - she did not afford the co-pay for Januvia, was discontinued.  She is advised to continue metformin  500 mg p.o. twice daily, and  glipizide to 10 mg XL p.o. daily at breakfast.   -She is also approached to start monitoring blood glucose 2 times a day-before BKF and at bedtime and will be engaged over the phone in 10 days.  She accepts this plan reluctantly.  - she is encouraged to call clinic for blood glucose levels less than 70 or above 300 mg /dl.  -Her foot exam today  is essentially normal.  She is advised to consult with a podiatrist for measurements and will sign off on her diabetic shoes on subsequent visit.  - Specific targets for  A1c;  LDL, HDL, Triglycerides, and  Waist Circumference were discussed with the patient.  2) Blood Pressure /Hypertension:  her blood pressure is controlled  to target.  She is not on antihypertensive medication at this time.   3) Lipids/Hyperlipidemia: Review of her recent lipid panel showed hypertriglyceridemia and high LDL.  She does not want to start statin treatment, she is on different herbal medications for high cholesterol.    4)  Weight/Diet: Her BMI is 27, not a candidate for major weight loss.  However, I discussed with her the fact that loss of 5 - 10% of her  current body weight will have the most impact on her diabetes management.  Exercise, and detailed carbohydrates information provided  -  detailed on discharge instructions.  5) Chronic Care/Health Maintenance:  -she  Is not on ACEI/ARB and Statin medications and  is encouraged to initiate and continue to follow up with Ophthalmology, Dentist,  Podiatrist at least yearly or according to recommendations, and advised to   stay away from smoking. I have recommended yearly flu vaccine and pneumonia vaccine at least every 5  years; moderate intensity exercise for up to 150 minutes weekly; and  sleep for at least 7 hours a day.  - she is  advised to maintain close follow up with [name], MD for primary care needs, as well as her other providers for optimal and coordinated care.  - Patient Care Time Today:  25 min, of which >50% was spent in  counseling and the rest reviewing her  current and  previous labs/studies, previous treatments, her blood glucose readings, and medications' doses and developing a plan for long-term care based on the latest recommendations for standards of care.   [name] participated in the discussions, expressed understanding, and voiced agreement with the above plans.  All questions were answered to her satisfaction. she is encouraged to contact clinic should she have any questions or concerns prior to her return visit.   Follow up plan: - Return keep her December appt., for Bring Meter and Logs- A1c in Office.  Glade Lloyd, MD [facility] [address] [place], [place] [address] Phone: [phone]  Fax: [phone]    10/05/2019, 5:20 PM  This note was partially dictated with voice recognition software. Similar sounding words can be transcribed inadequately or may not  be corrected upon review.

## 2019-10-08 ENCOUNTER — Ambulatory Visit: Payer: Medicare Other

## 2019-11-04 ENCOUNTER — Other Ambulatory Visit: Payer: Self-pay

## 2019-11-04 NOTE — Patient Outreach (Signed)
[facility]) Care Management  11/04/2019  REO FERRENCE August 25, 1956 [id number]   Medication Adherence call to Mrs. [name] HIPPA Compliant Voice message left with a call back number. Mrs. Francesco is showing past due on Ramipril 2.5 mg under [place].   [place] Management Direct Dial [phone]  Fax [phone] [email or web address]

## 2019-11-12 ENCOUNTER — Other Ambulatory Visit: Payer: Self-pay | Admitting: "Endocrinology

## 2019-11-18 ENCOUNTER — Ambulatory Visit: Payer: Medicare Other | Admitting: Nutrition

## 2019-11-18 ENCOUNTER — Ambulatory Visit: Payer: Medicare Other | Admitting: "Endocrinology

## 2019-11-19 ENCOUNTER — Other Ambulatory Visit: Payer: Self-pay

## 2019-11-19 NOTE — Patient Outreach (Signed)
[facility]) Care Management  11/19/2019  QUETZALY TOSTE 04/01/1956 [id number]   Medication Adherence call to Mrs. [name] [place] Identifiers Verify spoke with patient she is past due on Glipizide Er 5 mg,patient explain she take 1 tablet daily,patient explain she has enough until she order on the 1st of the month when she gets pay. [facility] said last pick up was on 08/19/19. Mrs. Uhl is showing past due under [place].   [facility] Management Direct Dial [phone]  Fax [phone] [email or web address]

## 2019-12-07 ENCOUNTER — Ambulatory Visit: Payer: Medicare Other | Admitting: Family Medicine

## 2019-12-16 ENCOUNTER — Ambulatory Visit
Admission: EM | Admit: 2019-12-16 | Discharge: 2019-12-16 | Disposition: A | Discharge status: Home / Self Care | Payer: Medicare Other | Attending: Emergency Medicine | Admitting: Emergency Medicine

## 2019-12-16 ENCOUNTER — Telehealth: Payer: Self-pay | Admitting: *Deleted

## 2019-12-16 ENCOUNTER — Ambulatory Visit (INDEPENDENT_AMBULATORY_CARE_PROVIDER_SITE_OTHER): Payer: Medicare Other

## 2019-12-16 ENCOUNTER — Other Ambulatory Visit: Payer: Self-pay

## 2019-12-16 DIAGNOSIS — M19049 Primary osteoarthritis, unspecified hand: Secondary | ICD-10-CM

## 2019-12-16 DIAGNOSIS — M79645 Pain in left finger(s): Secondary | ICD-10-CM

## 2019-12-16 DIAGNOSIS — Z0389 Encounter for observation for other suspected diseases and conditions ruled out: Secondary | ICD-10-CM | POA: Diagnosis not present

## 2019-12-16 DIAGNOSIS — M25542 Pain in joints of left hand: Secondary | ICD-10-CM

## 2019-12-16 MED ORDER — NAPROXEN 500 MG PO TABS: 500.0000 mg | ORAL_TABLET | Freq: Two times a day (BID) | ORAL | 0 refills | Status: DC

## 2019-12-16 NOTE — Telephone Encounter (Signed)
Pt got something in her hand when she was cleaning with a wire brillo pad about 5 years ago and it got in her finger now it is red swollen and it hurts. She was wondering if Dr Moshe Cipro could recommend something to keep it from hurting or what she should do.

## 2019-12-16 NOTE — Telephone Encounter (Signed)
SOUNDS AS THOUGH SHE LIKELY HAS INFECTION PRESENT , SHE IS AN UNCONTROLLED DIABETIC, I RECOMMEND URGENT CARE EVAL as no availability   before next week, and office f/u next  week

## 2019-12-16 NOTE — Telephone Encounter (Signed)
Called patient to advise her to seek treatment at the closest urgent care. No answer, left generic vm asking for a call back.

## 2019-12-16 NOTE — Discharge Instructions (Signed)
X-rays negative for fracture or retained foreign body.  Showed mild osteoarthritis Continue conservative management of rest, ice, heat, and gentle stretches Take naproxen as needed for pain relief (may cause abdominal discomfort, ulcers, and GI bleeds avoid taking with other NSAIDs) Follow up with PCP if symptoms persist Return or go to the ER if you have any new or worsening symptoms (fever, chills, chest pain, redness, swelling, discharge, worsening symptoms despite medication, etc...)

## 2019-12-16 NOTE — ED Triage Notes (Signed)
Pt states that she had a piece of brillo pad lodged in finger 6 years ago and area involved became painful 3 days ago. Left hand second finger

## 2019-12-16 NOTE — Telephone Encounter (Signed)
Spoke with patient and advised of treatment plan with verbal understanding. Follow up appt scheduled for 1/21 at 3:40 with DNP

## 2019-12-16 NOTE — ED Provider Notes (Signed)
[facility]   [id number] 12/16/19 Arrival Time: [id number]  CC: Finger pain  SUBJECTIVE: History from: patient. [name] is a 64 y.o. female complains of left index finger pain that began few days ago.  Denies a precipitating event or specific injury.  Localizes the pain to the left index finger.  Describes the pain as intermittent and "pressure" in character.  Has NOT tried OTC medications.  Symptoms are made worse to the touch.  Reports possible FB in left index finger about 6 years ago. Complains of associated swelling. Denies fever, chills, erythema, ecchymosis, weakness, numbness and tingling.  ROS: As per HPI.  All other pertinent ROS negative.     Past Medical History:  Diagnosis Date  . Allergic rhinitis   . Anemia, deficiency   . Diabetes mellitus   . GERD (gastroesophageal reflux disease)   . Hyperlipidemia   . Hypertension   . Obesity    Past Surgical History:  Procedure Laterality Date  . EXTRACTION OF SEVENTH TEETH  2007  . REMOVAL OF GREAT TOENAIL  2005  . SPLENECTOMY  2002   Allergies  Allergen Reactions  . Codeine    No current facility-administered medications on file prior to encounter.   Current Outpatient Medications on File Prior to Encounter  Medication Sig Dispense Refill  . acetaminophen (TYLENOL) 500 MG tablet Take 500 mg by mouth every 6 (six) hours as needed for mild pain or moderate pain.    . Alpha-D-Galactosidase (BEANO PO) Take 1 capsule by mouth daily.    Marland Kitchen aspirin 81 MG tablet Take 162 mg by mouth daily.     . Calcium-Vitamin D (SUPER CALCIUM/D) 600-125 MG-UNIT TABS Take 1 tablet by mouth daily.     Marland Kitchen Cod Liver Oil CAPS Take 1 capsule by mouth daily.     Marland Kitchen Echinacea-Goldenseal (ECHINACEA COMB/GOLDEN SEAL PO) Take by mouth.    . Garlic Oil [id number] MG CAPS Take 1 capsule by mouth daily.     Marland Kitchen glipiZIDE (GLUCOTROL XL) 10 MG 24 hr tablet Take 1 tablet (10 mg total) by mouth daily with breakfast. 30 tablet 3  . glipiZIDE (GLUCOTROL XL) 5  MG 24 hr tablet Take 1 tablet by mouth once daily with breakfast 90 tablet 0  . Lancets MISC 1 each by Does not apply route as directed. 100 each 3  . metFORMIN (GLUCOPHAGE) 500 MG tablet Take 1 tablet (500 mg total) by mouth 2 (two) times daily with a meal for 60 doses. 60 tablet 2  . Multiple Vitamins-Minerals (MULTIPLE VITAMINS/WOMENS) tablet Take 1 tablet by mouth daily.      Marland Kitchen OVER THE COUNTER MEDICATION Take 1 capsule by mouth daily. Leafy Half    . OVER THE COUNTER MEDICATION Take 200 mcg by mouth daily. Chromium    . OVER THE COUNTER MEDICATION Take 4 capsules by mouth daily. Colostrum    . ramipril (ALTACE) 2.5 MG capsule Take 1 capsule (2.5 mg total) by mouth daily. 30 capsule 5  . rosuvastatin (CRESTOR) 5 MG tablet Take 1 tablet (5 mg total) by mouth daily. 90 tablet 3  . Turmeric 500 MG TABS Take 2 tablets by mouth 3 (three) times daily.     Social History   Socioeconomic History  . Marital status: Divorced    Spouse name: Not on file  . Number of children: 1  . Years of education: Not on file  . Highest education level: Not on file  Occupational History  . Occupation: DISABLED  Tobacco Use  . Smoking status: Never Smoker  . Smokeless tobacco: Never Used  Substance and Sexual Activity  . Alcohol use: No  . Drug use: No  . Sexual activity: Yes    Birth control/protection: Post-menopausal  Other Topics Concern  . Not on file  Social History Narrative  . Not on file   Social Determinants of Health   Financial Resource Strain:   . Difficulty of Paying Living Expenses: Not on file  Food Insecurity:   . Worried About Charity fundraiser in the Last Year: Not on file  . Ran Out of Food in the Last Year: Not on file  Transportation Needs:   . Lack of Transportation (Medical): Not on file  . Lack of Transportation (Non-Medical): Not on file  Physical Activity:   . Days of Exercise per Week: Not on file  . Minutes of Exercise per Session: Not on file  Stress:   .  Feeling of Stress : Not on file  Social Connections:   . Frequency of Communication with Friends and Family: Not on file  . Frequency of Social Gatherings with Friends and Family: Not on file  . Attends Religious Services: Not on file  . Active Member of Clubs or Organizations: Not on file  . Attends Archivist Meetings: Not on file  . Marital Status: Not on file  Intimate Partner Violence:   . Fear of Current or Ex-Partner: Not on file  . Emotionally Abused: Not on file  . Physically Abused: Not on file  . Sexually Abused: Not on file   Family History  Problem Relation Age of Onset  . Diabetes Mother   . Hypertension Mother   . Hyperlipidemia Mother   . Lung disease Father   . Hypertension Sister   . Hyperlipidemia Sister   . Hypertension Brother   . Sickle cell trait Son   . Arthritis Other   . Asthma Other   . Cancer Other     OBJECTIVE:  Vitals:   12/16/19 1737  BP: 116/80  Pulse: 88  Resp: 18  Temp: 98.8 F (37.1 C)  SpO2: 95%    General appearance: ALERT; in no acute distress.  Head: NCAT Lungs: Normal respiratory effort CV: radial pulses 2+ bilaterally. Cap refill < 2 seconds Musculoskeletal: LT hand  Inspection: Skin warm, dry, clear and intact without obvious erythema, effusion, or ecchymosis; fingernails with mild yellow discoloration Palpation: TTP over 4th DIP joint ROM: FROM active and passive Strength:  5/5 grip strength Skin: warm and dry Neurologic: Ambulates without difficulty; Sensation intact about the upper extremities Psychological: alert and cooperative; normal mood and affect  DIAGNOSTIC STUDIES:  DG Hand Complete Left  Result Date: 12/16/2019 CLINICAL DATA:  Possible index finger foreign body. EXAM: LEFT HAND - COMPLETE 3+ VIEW COMPARISON:  Left index finger x-rays dated November 30, 2010. FINDINGS: No acute fracture or dislocation. 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 Bone mineralization is normal. Soft  tissues are unremarkable. No radiopaque foreign body identified. IMPRESSION: No radiopaque foreign body.  No acute osseous abnormality. Electronically Signed   By: Titus Dubin M.D.   On: 12/16/2019 18:00     X-rays negative for bony abnormalities including fracture, or dislocation.  No FB  I have reviewed the x-rays myself and the radiologist interpretation. I am in agreement with the radiologist interpretation.     ASSESSMENT & PLAN:  1. Pain of finger of left hand   2. Arthritis of  finger      Meds ordered this encounter  Medications  . naproxen (NAPROSYN) 500 MG tablet    Sig: Take 1 tablet (500 mg total) by mouth 2 (two) times daily. Limit use to 3-5 days    Dispense:  30 tablet    Refill:  0    Order Specific Question:   Supervising Provider    Answer:   Raylene Everts [id number]   X-rays negative for fracture or retained foreign body.  Showed mild osteoarthritis Continue conservative management of rest, ice, heat, and gentle stretches Take naproxen as needed for pain relief (may cause abdominal discomfort, ulcers, and GI bleeds avoid taking with other NSAIDs) Follow up with PCP if symptoms persist Return or go to the ER if you have any new or worsening symptoms (fever, chills, chest pain, redness, swelling, discharge, worsening symptoms despite medication, etc...)   Reviewed expectations re: course of current medical issues. Questions answered. Outlined signs and symptoms indicating need for more acute intervention. Patient verbalized understanding. After Visit Summary given.    Lestine Box, PA-C 12/16/19 1821

## 2019-12-16 NOTE — Telephone Encounter (Signed)
Please advise 

## 2019-12-17 ENCOUNTER — Telehealth: Payer: Self-pay | Admitting: Emergency Medicine

## 2019-12-17 MED ORDER — NAPROXEN 500 MG PO TABS: 500.0000 mg | ORAL_TABLET | Freq: Two times a day (BID) | ORAL | 0 refills | Status: DC

## 2019-12-17 NOTE — Telephone Encounter (Signed)
Med printed in office in error.  Sent to pharmacy on file

## 2019-12-24 ENCOUNTER — Other Ambulatory Visit: Payer: Self-pay

## 2019-12-24 ENCOUNTER — Encounter: Payer: Self-pay | Admitting: Family Medicine

## 2019-12-24 ENCOUNTER — Ambulatory Visit (INDEPENDENT_AMBULATORY_CARE_PROVIDER_SITE_OTHER): Payer: Medicare Other | Admitting: Family Medicine

## 2019-12-24 VITALS — BP 120/74 | HR 94 | Temp 98.4°F | Resp 15 | Ht 64.0 in | Wt 139.0 lb

## 2019-12-24 DIAGNOSIS — L853 Xerosis cutis: Secondary | ICD-10-CM | POA: Diagnosis not present

## 2019-12-24 DIAGNOSIS — R6889 Other general symptoms and signs: Secondary | ICD-10-CM

## 2019-12-24 DIAGNOSIS — K5900 Constipation, unspecified: Secondary | ICD-10-CM

## 2019-12-24 DIAGNOSIS — M1990 Unspecified osteoarthritis, unspecified site: Secondary | ICD-10-CM | POA: Diagnosis not present

## 2019-12-24 NOTE — Patient Instructions (Addendum)
Happy New Year! May you have a year filled with hope, love, happiness and laughter.  I appreciate the opportunity to provide you with care for your health and wellness. Today we discussed: recent UC visit, arthritis, dry skin, cold intolerance.   Follow up: 01/19/2020  Labs  No referrals today  Use tylenol for aches and pains, can use heating pad   Don't take iron until we look at labs.   Please continue to practice social distancing to keep you, your family, and our community safe.  If you must go out, please wear a mask and practice good handwashing.  It was a pleasure to see you and I look forward to continuing to work together on your health and well-being. Please do not hesitate to call the office if you need care or have questions about your care.  Have a wonderful day and week. With Gratitude, Cherly Beach, DNP, AGNP-BC

## 2019-12-24 NOTE — Assessment & Plan Note (Signed)
Recent Dx of MILD arthritis after being seen at [facility] for finger pain. Tried the naproxen reports it makes her feel bad. I have advised tylenol and heating pad use.

## 2019-12-24 NOTE — Assessment & Plan Note (Signed)
Extreme cold intolerance, she is bundled up today in office, she even keeps scarf wrapped around her. She reports wondering her blood is low. She also has some complaints that line up with Thyroid concerns so will be getting labs.

## 2019-12-24 NOTE — Assessment & Plan Note (Signed)
Reports itching at night and dry skin as been using olive oil. Will check TSH

## 2019-12-24 NOTE — Progress Notes (Signed)
Subjective:  Patient ID: [name], female    DOB: Mar 03, 1956  Age: 64 y.o. MRN: [id number]  CC:  Chief Complaint  Patient presents with  . follow up from UC visit    was prescribed Naproxen but pt states it makes her feel bad      HPI  HPI  [name] is a 64 year old female patient who went to the urgent care after having left index finger pain that began several days prior to her going on January 13.  Pain was described as intermittent and pressure-like in character.  Had not tried any over-the-counter medications.  Symptoms were made worse to touch.  She had complained of localized swelling while there.  She had an x-ray there and they diagnosed her with mild arthritis.  Syndrome, naproxen but she reports that the naproxen makes her feel bad.  Today she also complains of having increased constipation, feelings of having dry skin and itchiness at night.  And extreme cold intolerance.  She denies any changes in her hair.  Denies any changes in her appetite.  Denies any previous history of thyroid issues  Today patient denies signs and symptoms of COVID 19 infection including fever, chills, cough, shortness of breath, and headache. Past Medical, Surgical, Social History, Allergies, and Medications have been Reviewed.   Past Medical History:  Diagnosis Date  . Allergic rhinitis   . Anemia, deficiency   . Diabetes mellitus   . GERD (gastroesophageal reflux disease)   . Hyperlipidemia   . Hypertension   . Obesity     Current Meds  Medication Sig  . acetaminophen (TYLENOL) 500 MG tablet Take 500 mg by mouth every 6 (six) hours as needed for mild pain or moderate pain.  . Alpha-D-Galactosidase (BEANO PO) Take 1 capsule by mouth daily.  Marland Kitchen aspirin 81 MG tablet Take 162 mg by mouth daily.   . Calcium-Vitamin D (SUPER CALCIUM/D) 600-125 MG-UNIT TABS Take 1 tablet by mouth daily.   Marland Kitchen Cod Liver Oil CAPS Take 1 capsule by mouth daily.   Marland Kitchen Echinacea-Goldenseal (ECHINACEA  COMB/GOLDEN SEAL PO) Take by mouth.  . Garlic Oil [id number] MG CAPS Take 1 capsule by mouth daily.   Marland Kitchen glipiZIDE (GLUCOTROL XL) 10 MG 24 hr tablet Take 1 tablet (10 mg total) by mouth daily with breakfast.  . Lancets MISC 1 each by Does not apply route as directed.  . Multiple Vitamins-Minerals (MULTIPLE VITAMINS/WOMENS) tablet Take 1 tablet by mouth daily.    Marland Kitchen OVER THE COUNTER MEDICATION Take 1 capsule by mouth daily. Leafy Half  . OVER THE COUNTER MEDICATION Take 200 mcg by mouth daily. Chromium  . OVER THE COUNTER MEDICATION Take 4 capsules by mouth daily. Colostrum  . ramipril (ALTACE) 2.5 MG capsule Take 1 capsule (2.5 mg total) by mouth daily.  . rosuvastatin (CRESTOR) 5 MG tablet Take 1 tablet (5 mg total) by mouth daily.  . Turmeric 500 MG TABS Take 2 tablets by mouth 3 (three) times daily.    ROS:  Review of Systems  HENT: Negative.   Eyes: Negative.   Respiratory: Negative.   Cardiovascular: Negative.   Gastrointestinal: Positive for constipation.  Genitourinary: Negative.   Musculoskeletal: Negative.   Skin: Negative.   Neurological: Negative.   Endo/Heme/Allergies: Negative.        Reports feeling cold, having dry skin and being constipated.   Psychiatric/Behavioral: Negative.   All other systems reviewed and are negative.    Objective:   Today's Vitals: BP  120/74   Pulse 94   Temp 98.4 F (36.9 C) (Oral)   Resp 15   Ht 5\' 4"  (1.626 m)   Wt 139 lb (63 kg)   SpO2 97%   BMI 23.86 kg/m  Vitals with BMI 12/24/2019 12/16/2019 10/05/2019  Height 5\' 4"  - 5\' 4"   Weight 139 lbs - 149 lbs  BMI 0000000 - 99991111  Systolic 123456 [id number] 0000000  Diastolic 74 80 72  Pulse 94 88 76     Physical Exam Vitals and nursing note reviewed.  Constitutional:      Appearance: Normal appearance. She is well-developed, well-groomed and normal weight.  HENT:     Head: Normocephalic and atraumatic.     Right Ear: External ear normal.     Left Ear: External ear normal.     Nose: Nose normal.      Mouth/Throat:     Mouth: Mucous membranes are moist.     Pharynx: Oropharynx is clear.  Eyes:     General:        Right eye: No discharge.        Left eye: No discharge.     Conjunctiva/sclera: Conjunctivae normal.  Cardiovascular:     Rate and Rhythm: Normal rate and regular rhythm.     Pulses: Normal pulses.     Heart sounds: Normal heart sounds.  Pulmonary:     Effort: Pulmonary effort is normal.     Breath sounds: Normal breath sounds.  Musculoskeletal:        General: Normal range of motion.     Cervical back: Normal range of motion and neck supple.  Skin:    General: Skin is warm.  Neurological:     General: No focal deficit present.     Mental Status: She is alert and oriented to person, place, and time.  Psychiatric:        Attention and Perception: Attention normal.        Mood and Affect: Mood normal.        Speech: Speech normal.        Behavior: Behavior normal. Behavior is cooperative.        Thought Content: Thought content normal.        Cognition and Memory: Cognition normal.        Judgment: Judgment normal.     Assessment   1. Constipation, unspecified constipation type   2. Cold intolerance   3. Dry skin   4. Arthritis     Tests ordered Orders Placed This Encounter  Procedures  . BASIC METABOLIC PANEL WITH GFR  . CBC  . TSH  . Basic Metabolic Panel (BMET)     Plan: Please see assessment and plan per problem list above.   No orders of the defined types were placed in this encounter.   Patient to follow-up in as scheduled .  Emma Mayo, NP

## 2019-12-24 NOTE — Assessment & Plan Note (Signed)
She complains of constipation and other S&S of hypothyroid, will check labs.

## 2019-12-25 ENCOUNTER — Other Ambulatory Visit: Payer: Self-pay

## 2019-12-25 ENCOUNTER — Telehealth: Payer: Self-pay | Admitting: *Deleted

## 2019-12-25 DIAGNOSIS — M79662 Pain in left lower leg: Secondary | ICD-10-CM

## 2019-12-25 DIAGNOSIS — M79661 Pain in right lower leg: Secondary | ICD-10-CM

## 2019-12-25 MED ORDER — UNABLE TO FIND: 0 refills | Status: DC

## 2019-12-25 NOTE — Telephone Encounter (Signed)
Pt would like an electric blanket and a heating pad called in to [facility] in [place] due to her legs and feet hurting. She said with a prescription her insurance would cover this

## 2019-12-25 NOTE — Telephone Encounter (Signed)
Scripts sent in

## 2019-12-28 ENCOUNTER — Other Ambulatory Visit (HOSPITAL_COMMUNITY)
Admission: RE | Admit: 2019-12-28 | Discharge: 2019-12-28 | Disposition: A | Discharge status: Home / Self Care | Payer: Medicare Other | Source: Ambulatory Visit | Attending: Family Medicine | Admitting: Family Medicine

## 2019-12-28 DIAGNOSIS — K5900 Constipation, unspecified: Secondary | ICD-10-CM | POA: Insufficient documentation

## 2019-12-28 DIAGNOSIS — R6889 Other general symptoms and signs: Secondary | ICD-10-CM | POA: Diagnosis not present

## 2019-12-28 LAB — BASIC METABOLIC PANEL
Anion gap: 11 (ref 5–15)
BUN: 10 mg/dL (ref 8–23)
CO2: 24 mmol/L (ref 22–32)
Calcium: 9.3 mg/dL (ref 8.9–10.3)
Chloride: 90 mmol/L — ABNORMAL LOW (ref 98–111)
Creatinine, Ser: 0.66 mg/dL (ref 0.44–1.00)
GFR calc Af Amer: >60 mL/min (ref >60)
GFR calc non Af Amer: >60 mL/min (ref >60)
Glucose, Bld: 290 mg/dL — ABNORMAL HIGH (ref 70–99)
Potassium: 3.3 mmol/L — ABNORMAL LOW (ref 3.5–5.1)
Sodium: 125 mmol/L — ABNORMAL LOW (ref 135–145)

## 2019-12-30 ENCOUNTER — Other Ambulatory Visit: Payer: Self-pay

## 2019-12-30 ENCOUNTER — Telehealth: Payer: Self-pay

## 2019-12-30 DIAGNOSIS — E1140 Type 2 diabetes mellitus with diabetic neuropathy, unspecified: Secondary | ICD-10-CM

## 2019-12-30 DIAGNOSIS — E559 Vitamin D deficiency, unspecified: Secondary | ICD-10-CM

## 2019-12-30 DIAGNOSIS — R6889 Other general symptoms and signs: Secondary | ICD-10-CM

## 2019-12-30 DIAGNOSIS — E782 Mixed hyperlipidemia: Secondary | ICD-10-CM

## 2019-12-30 NOTE — Telephone Encounter (Signed)
Labs added.

## 2020-01-06 DIAGNOSIS — I10 Essential (primary) hypertension: Secondary | ICD-10-CM | POA: Diagnosis not present

## 2020-01-06 DIAGNOSIS — Z79899 Other long term (current) drug therapy: Secondary | ICD-10-CM | POA: Diagnosis not present

## 2020-01-06 DIAGNOSIS — K5900 Constipation, unspecified: Secondary | ICD-10-CM | POA: Diagnosis not present

## 2020-01-06 DIAGNOSIS — N390 Urinary tract infection, site not specified: Secondary | ICD-10-CM | POA: Diagnosis not present

## 2020-01-06 DIAGNOSIS — Z7984 Long term (current) use of oral hypoglycemic drugs: Secondary | ICD-10-CM | POA: Diagnosis not present

## 2020-01-06 DIAGNOSIS — R319 Hematuria, unspecified: Secondary | ICD-10-CM | POA: Diagnosis not present

## 2020-01-06 DIAGNOSIS — E7800 Pure hypercholesterolemia, unspecified: Secondary | ICD-10-CM | POA: Diagnosis not present

## 2020-01-06 DIAGNOSIS — R110 Nausea: Secondary | ICD-10-CM | POA: Diagnosis not present

## 2020-01-06 DIAGNOSIS — E1165 Type 2 diabetes mellitus with hyperglycemia: Secondary | ICD-10-CM | POA: Diagnosis not present

## 2020-01-07 DIAGNOSIS — K5900 Constipation, unspecified: Secondary | ICD-10-CM | POA: Diagnosis not present

## 2020-01-11 ENCOUNTER — Other Ambulatory Visit: Payer: Self-pay

## 2020-01-11 ENCOUNTER — Ambulatory Visit
Admission: EM | Admit: 2020-01-11 | Discharge: 2020-01-11 | Disposition: A | Discharge status: Home / Self Care | Payer: Medicare Other | Attending: Emergency Medicine | Admitting: Emergency Medicine

## 2020-01-11 DIAGNOSIS — K5900 Constipation, unspecified: Secondary | ICD-10-CM | POA: Diagnosis not present

## 2020-01-11 DIAGNOSIS — Z1152 Encounter for screening for COVID-19: Secondary | ICD-10-CM | POA: Diagnosis not present

## 2020-01-11 DIAGNOSIS — R110 Nausea: Secondary | ICD-10-CM | POA: Diagnosis not present

## 2020-01-11 LAB — POCT URINALYSIS DIP (MANUAL ENTRY)
Bilirubin, UA: NEGATIVE
Glucose, UA: 100 mg/dL — AB
Ketones, POC UA: NEGATIVE mg/dL
Nitrite, UA: NEGATIVE
Protein Ur, POC: NEGATIVE mg/dL
Spec Grav, UA: >=1.03 — AB (ref 1.010–1.025)
Urobilinogen, UA: 0.2 E.U./dL
pH, UA: 5 (ref 5.0–8.0)

## 2020-01-11 MED ORDER — CALCIUM POLYCARBOPHIL 625 MG PO TABS: 625.0000 mg | ORAL_TABLET | Freq: Every day | ORAL | 30 refills | Status: DC

## 2020-01-11 MED ORDER — ONDANSETRON HCL 4 MG PO TABS: 4.0000 mg | ORAL_TABLET | Freq: Three times a day (TID) | ORAL | 0 refills | Status: DC | PRN

## 2020-01-11 MED ORDER — PROBIOTIC 1-250 BILLION-MG PO CAPS: 1.0000 | ORAL_CAPSULE | ORAL | 0 refills | Status: AC

## 2020-01-11 NOTE — ED Provider Notes (Addendum)
[facility]    CSN: [id number] Arrival date & time: 01/11/20  1623      History   Chief Complaint No chief complaint on file.   HPI [name] is a 64 y.o. female.   Who presented to the urgent care with a complaint of nausea, loss of appetite and constipation for the past few weeks.  Patient was reportedly seen at [facility] and was prescribed antibiotic for UTI, and was given fluid for dehydration.  Patient is on day 3 for antibiotic treatment.  Denies a precipitating event, or specific injury.  Patient reports history of chronic constipation.  Has tried OTC medications without relief.  Symptoms are made worse with eating.  Reports similar symptoms in the past that resolved with MiraLAX.  Denies fever, chills, weight change, chest pain, nausea, vomiting, changes in bowel or bladder habits.   The history is provided by the patient. No language interpreter was used.    Past Medical History:  Diagnosis Date  . Allergic rhinitis   . Anemia, deficiency   . Diabetes mellitus   . GERD (gastroesophageal reflux disease)   . Hyperlipidemia   . Hypertension   . Obesity     Patient Active Problem List   Diagnosis Date Noted  . Cold intolerance 12/24/2019  . Dry skin 12/24/2019  . Constipation 12/24/2019  . Arthritis 12/24/2019  . Uncontrolled type 2 diabetes mellitus with hyperglycemia ([place]) 08/05/2019  . Personal history of noncompliance with medical treatment, presenting hazards to health 08/05/2019  . Educated about COVID-19 virus infection 05/01/2019  . Annual physical exam 08/15/2015  . Post-menopausal bleeding 08/15/2015  . Non compliance with medical treatment 08/15/2015  . Malignant neoplasm of breast (female) ([place]) 11/28/2010  . Abnormal mammogram 06/12/2010  . Hyperlipidemia 05/22/2010  . Type II diabetes mellitus ([place]) 02/27/2008  . ALLERGIC RHINITIS, SEASONAL 02/27/2008  . GERD 02/27/2008    Past Surgical History:  Procedure Laterality Date  .  EXTRACTION OF SEVENTH TEETH  2007  . REMOVAL OF GREAT TOENAIL  2005  . SPLENECTOMY  2002    OB History    Gravida  1   Para  1   Term  1   Preterm      AB      Living        SAB      TAB      Ectopic      Multiple      Live Births               Home Medications    Prior to Admission medications   Medication Sig Start Date End Date Taking? Authorizing Provider  acetaminophen (TYLENOL) 500 MG tablet Take 500 mg by mouth every 6 (six) hours as needed for mild pain or moderate pain.    [provider]  Alpha-D-Galactosidase (BEANO PO) Take 1 capsule by mouth daily.    [provider]  aspirin 81 MG tablet Take 162 mg by mouth daily.     [provider]  Bacillus Coagulans-Inulin (PROBIOTIC) 1-250 BILLION-MG CAPS Take 1 capsule by mouth 1 day or 1 dose for 1 dose. 01/11/20 01/12/20  Izabela Ow, Darrelyn Hillock, FNP  Calcium-Vitamin D (SUPER CALCIUM/D) 600-125 MG-UNIT TABS Take 1 tablet by mouth daily.     [provider]  [facility] Liver Oil CAPS Take 1 capsule by mouth daily.     [provider]  Echinacea-Goldenseal (ECHINACEA COMB/GOLDEN SEAL PO) Take by mouth.    [provider]  Garlic Oil [id number] MG CAPS Take 1 capsule by mouth daily.     [provider]  glipiZIDE (GLUCOTROL XL) 10 MG 24 hr tablet Take 1 tablet (10 mg total) by mouth daily with breakfast. 08/17/19   Nida, Marella Chimes, MD  Lancets MISC 1 each by Does not apply route as directed. 08/17/19   Cassandria Anger, MD  Multiple Vitamins-Minerals (MULTIPLE VITAMINS/WOMENS) tablet Take 1 tablet by mouth daily.      [provider]  naproxen (NAPROSYN) 500 MG tablet Take 1 tablet (500 mg total) by mouth 2 (two) times daily. Patient not taking: Reported on 12/24/2019 12/17/19   Wurst, [place], PA-C  ondansetron (ZOFRAN) 4 MG tablet Take 1 tablet (4 mg total) by mouth every 8 (eight) hours as needed for nausea or vomiting. 01/11/20   Dyneshia Baccam, Darrelyn Hillock, FNP    OVER THE COUNTER MEDICATION Take 1 capsule by mouth daily. Leafy Half    [provider]  OVER THE COUNTER MEDICATION Take 200 mcg by mouth daily. Chromium    [provider]  OVER THE COUNTER MEDICATION Take 4 capsules by mouth daily. Colostrum    [provider]  polycarbophil (FIBERCON) 625 MG tablet Take 1 tablet (625 mg total) by mouth daily. 01/11/20   Nashali Ditmer, Darrelyn Hillock, FNP  ramipril (ALTACE) 2.5 MG capsule Take 1 capsule (2.5 mg total) by mouth daily. 08/19/19   [name], MD  rosuvastatin (CRESTOR) 5 MG tablet Take 1 tablet (5 mg total) by mouth daily. 08/19/19   [name], MD  Turmeric 500 MG TABS Take 2 tablets by mouth 3 (three) times daily.    [provider]  UNABLE TO FIND Heating pad 12/25/19   Perlie Mayo, NP  UNABLE TO FIND Electric blanket 12/25/19   Perlie Mayo, NP    Family History Family History  Problem Relation Age of Onset  . Diabetes Mother   . Hypertension Mother   . Hyperlipidemia Mother   . Lung disease Father   . Hypertension Sister   . Hyperlipidemia Sister   . Hypertension Brother   . Sickle cell trait Son   . Arthritis Other   . Asthma Other   . Cancer Other     Social History Social History   Tobacco Use  . Smoking status: Never Smoker  . Smokeless tobacco: Never Used  Substance Use Topics  . Alcohol use: No  . Drug use: No     Allergies   Codeine   Review of Systems Review of Systems  Constitutional: Positive for appetite change.  Respiratory: Negative.   Cardiovascular: Negative.   Gastrointestinal: Positive for constipation and nausea.  All other systems reviewed and are negative.    Physical Exam Triage Vital Signs ED Triage Vitals  Enc Vitals Group     BP 01/11/20 1631 118/80     Pulse Rate 01/11/20 1631 85     Resp 01/11/20 1631 16     Temp 01/11/20 1631 98.6 F (37 C)     Temp Source 01/11/20 1631 Oral     SpO2 01/11/20 1631 97 %     Weight --       Height --      Head Circumference --      Peak Flow --      Pain Score 01/11/20 1639 0     Pain Loc --      Pain Edu? --      Excl. in  GC? --    No data found.  Updated Vital Signs BP 118/80 (BP Location: Right Arm)   Pulse 85   Temp 98.6 F (37 C) (Oral)   Resp 16   SpO2 97%   Visual Acuity Right Eye Distance:   Left Eye Distance:   Bilateral Distance:    Right Eye Near:   Left Eye Near:    Bilateral Near:     Physical Exam Vitals and nursing note reviewed.  Constitutional:      General: She is not in acute distress.    Appearance: Normal appearance. She is normal weight. She is not ill-appearing or toxic-appearing.  HENT:     Head: Normocephalic.     Right Ear: Tympanic membrane, ear canal and external ear normal. There is no impacted cerumen.     Left Ear: Tympanic membrane, ear canal and external ear normal. There is no impacted cerumen.     Nose: Nose normal. No congestion.     Mouth/Throat:     Mouth: Mucous membranes are moist.     Pharynx: Oropharynx is clear. No oropharyngeal exudate or posterior oropharyngeal erythema.  Cardiovascular:     Rate and Rhythm: Normal rate and regular rhythm.     Pulses: Normal pulses.     Heart sounds: Normal heart sounds. No murmur.  Pulmonary:     Effort: Pulmonary effort is normal. No respiratory distress.     Breath sounds: Normal breath sounds. No wheezing or rhonchi.  Chest:     Chest wall: No tenderness.  Abdominal:     General: Abdomen is flat. Bowel sounds are normal. There is no distension.     Palpations: There is no mass.     Tenderness: There is no abdominal tenderness.  Skin:    Capillary Refill: Capillary refill takes less than 2 seconds.  Neurological:     General: No focal deficit present.     Mental Status: She is alert and oriented to person, place, and time.      UC Treatments / Results  Labs (all labs ordered are listed, but only abnormal results are displayed) Labs Reviewed  POCT URINALYSIS  DIP (MANUAL ENTRY) - Abnormal; Notable for the following components:      Result Value   Glucose, UA =100 (*)    Spec Grav, UA >=1.030 (*)    Blood, UA trace-intact (*)    Leukocytes, UA Small (1+) (*)    All other components within normal limits  NOVEL CORONAVIRUS, NAA    EKG   Radiology No results found.  Procedures Procedures (including critical care time)  Medications Ordered in UC Medications - No data to display  Initial Impression / Assessment and Plan / UC Course  I have reviewed the triage vital signs and the nursing notes.  Pertinent labs & imaging results that were available during my care of the patient were reviewed by me and considered in my medical decision making (see chart for details).   POCT urine analysis showed trace of blood and small amount of leukocytes.  Patient was advised to continue to take antibiotic as prescribed and to completion. Patient is stable for discharge COVID-19 test was ordered Zofran prescribed for nausea Probiotic prescribed for constipation FiberCon was prescribed for constipation Patient was advised to to increase fluid intake To follow-up with primary care   Final Clinical Impressions(s) / UC Diagnoses   Final diagnoses:  Encounter for screening for COVID-19  Constipation, unspecified constipation type  Nausea without vomiting  Discharge Instructions     Get rest and drink fluids Zofran prescribed.  Take as directed.   FiberCon and a probiotic was prescribed for constipation DIET Instructions:  30 minutes after taking nausea medicine, begin with sips of clear liquids. If able to hold down 2 - 4 ounces for 30 minutes, begin drinking more. Increase your fluid intake to replace losses. Clear liquids only for 24 hours (water, tea, sport drinks, clear flat ginger ale or cola and juices, broth, jello, popsicles, ect). Advance to bland foods, applesauce, rice, baked or boiled chicken, ect. Avoid milk, greasy foods and  anything that doesn't agree with you.  If you experience new or worsening symptoms return or go to ER such as fever, chills, nausea, vomiting, diarrhea, bloody or dark tarry stools, constipation, urinary symptoms, worsening abdominal discomfort, symptoms that do not improve with medications, inability to keep fluids down, etc...  COVID testing ordered.  It will take between 2-7 days for test results.  Someone will contact you regarding abnormal results.    In the meantime: You should remain isolated in your home for 10 days from symptom onset AND greater than 72 hours after symptoms resolution (absence of fever without the use of fever-reducing medication and improvement in respiratory symptoms), whichever is longer Get plenty of rest and push fluids Use medications daily for symptom relief Use OTC medications like ibuprofen or tylenol as needed fever or pain Call or go to the ED if you have any new or worsening symptoms such as fever, worsening cough, shortness of breath, chest tightness, chest pain, turning blue, changes in mental status, etc...       ED Prescriptions    Medication Sig Dispense Auth. Provider   ondansetron (ZOFRAN) 4 MG tablet Take 1 tablet (4 mg total) by mouth every 8 (eight) hours as needed for nausea or vomiting. 20 tablet Shareese Macha, Darrelyn Hillock, FNP   Bacillus Coagulans-Inulin (PROBIOTIC) 1-250 BILLION-MG CAPS Take 1 capsule by mouth 1 day or 1 dose for 1 dose. 30 capsule Elchonon Maxson, Darrelyn Hillock, FNP   polycarbophil (FIBERCON) 625 MG tablet Take 1 tablet (625 mg total) by mouth daily. 1 tablet Bethani Brugger, Darrelyn Hillock, FNP     PDMP not reviewed this encounter.   Emerson Monte, FNP 01/11/20 1720    Emerson Monte, FNP 01/11/20 1743

## 2020-01-11 NOTE — ED Triage Notes (Signed)
Pt presents with loss of appetite and nausea, was seen [facility] on 1/5 for same

## 2020-01-11 NOTE — Discharge Instructions (Addendum)
Get rest and drink fluids Zofran prescribed.  Take as directed.   FiberCon and a probiotic was prescribed for constipation DIET Instructions:  30 minutes after taking nausea medicine, begin with sips of clear liquids. If able to hold down 2 - 4 ounces for 30 minutes, begin drinking more. Increase your fluid intake to replace losses. Clear liquids only for 24 hours (water, tea, sport drinks, clear flat ginger ale or cola and juices, broth, jello, popsicles, ect). Advance to bland foods, applesauce, rice, baked or boiled chicken, ect. Avoid milk, greasy foods and anything that doesnt agree with you.  If you experience new or worsening symptoms return or go to ER such as fever, chills, nausea, vomiting, diarrhea, bloody or dark tarry stools, constipation, urinary symptoms, worsening abdominal discomfort, symptoms that do not improve with medications, inability to keep fluids down, etc...  COVID testing ordered.  It will take between 2-7 days for test results.  Someone will contact you regarding abnormal results.    In the meantime: You should remain isolated in your home for 10 days from symptom onset AND greater than 72 hours after symptoms resolution (absence of fever without the use of fever-reducing medication and improvement in respiratory symptoms), whichever is longer Get plenty of rest and push fluids Use medications daily for symptom relief Use OTC medications like ibuprofen or tylenol as needed fever or pain Call or go to the ED if you have any new or worsening symptoms such as fever, worsening cough, shortness of breath, chest tightness, chest pain, turning blue, changes in mental status, etc..Marland Kitchen

## 2020-01-12 LAB — NOVEL CORONAVIRUS, NAA: SARS-CoV-2, NAA: NOT DETECTED

## 2020-01-13 DIAGNOSIS — E119 Type 2 diabetes mellitus without complications: Secondary | ICD-10-CM | POA: Diagnosis not present

## 2020-01-13 DIAGNOSIS — R61 Generalized hyperhidrosis: Secondary | ICD-10-CM | POA: Diagnosis not present

## 2020-01-13 DIAGNOSIS — I10 Essential (primary) hypertension: Secondary | ICD-10-CM | POA: Diagnosis not present

## 2020-01-13 DIAGNOSIS — K5900 Constipation, unspecified: Secondary | ICD-10-CM | POA: Diagnosis not present

## 2020-01-13 DIAGNOSIS — Z885 Allergy status to narcotic agent status: Secondary | ICD-10-CM | POA: Diagnosis not present

## 2020-01-13 DIAGNOSIS — Z7984 Long term (current) use of oral hypoglycemic drugs: Secondary | ICD-10-CM | POA: Diagnosis not present

## 2020-01-13 DIAGNOSIS — Z79899 Other long term (current) drug therapy: Secondary | ICD-10-CM | POA: Diagnosis not present

## 2020-01-13 DIAGNOSIS — E7800 Pure hypercholesterolemia, unspecified: Secondary | ICD-10-CM | POA: Diagnosis not present

## 2020-01-13 DIAGNOSIS — Z7982 Long term (current) use of aspirin: Secondary | ICD-10-CM | POA: Diagnosis not present

## 2020-01-15 ENCOUNTER — Other Ambulatory Visit: Payer: Self-pay | Admitting: "Endocrinology

## 2020-01-18 DIAGNOSIS — E871 Hypo-osmolality and hyponatremia: Secondary | ICD-10-CM | POA: Diagnosis not present

## 2020-01-18 DIAGNOSIS — R404 Transient alteration of awareness: Secondary | ICD-10-CM | POA: Diagnosis not present

## 2020-01-18 DIAGNOSIS — R4020 Unspecified coma: Secondary | ICD-10-CM | POA: Diagnosis not present

## 2020-01-18 DIAGNOSIS — R55 Syncope and collapse: Secondary | ICD-10-CM | POA: Diagnosis not present

## 2020-01-18 DIAGNOSIS — Z743 Need for continuous supervision: Secondary | ICD-10-CM | POA: Diagnosis not present

## 2020-01-18 DIAGNOSIS — R0902 Hypoxemia: Secondary | ICD-10-CM | POA: Diagnosis not present

## 2020-01-18 DIAGNOSIS — E1165 Type 2 diabetes mellitus with hyperglycemia: Secondary | ICD-10-CM | POA: Diagnosis not present

## 2020-01-19 ENCOUNTER — Ambulatory Visit: Payer: Medicare Other | Admitting: Family Medicine

## 2020-01-19 DIAGNOSIS — N200 Calculus of kidney: Secondary | ICD-10-CM | POA: Diagnosis not present

## 2020-01-19 DIAGNOSIS — A409 Streptococcal sepsis, unspecified: Secondary | ICD-10-CM | POA: Diagnosis not present

## 2020-01-19 DIAGNOSIS — N390 Urinary tract infection, site not specified: Secondary | ICD-10-CM | POA: Diagnosis not present

## 2020-01-19 DIAGNOSIS — B964 Proteus (mirabilis) (morganii) as the cause of diseases classified elsewhere: Secondary | ICD-10-CM | POA: Diagnosis not present

## 2020-01-19 DIAGNOSIS — E1165 Type 2 diabetes mellitus with hyperglycemia: Secondary | ICD-10-CM | POA: Diagnosis not present

## 2020-01-19 DIAGNOSIS — E119 Type 2 diabetes mellitus without complications: Secondary | ICD-10-CM | POA: Diagnosis not present

## 2020-01-19 DIAGNOSIS — K760 Fatty (change of) liver, not elsewhere classified: Secondary | ICD-10-CM | POA: Diagnosis not present

## 2020-01-19 DIAGNOSIS — E871 Hypo-osmolality and hyponatremia: Secondary | ICD-10-CM | POA: Diagnosis not present

## 2020-01-19 DIAGNOSIS — G9341 Metabolic encephalopathy: Secondary | ICD-10-CM | POA: Diagnosis not present

## 2020-01-19 DIAGNOSIS — I10 Essential (primary) hypertension: Secondary | ICD-10-CM | POA: Diagnosis not present

## 2020-01-19 DIAGNOSIS — Z9081 Acquired absence of spleen: Secondary | ICD-10-CM | POA: Diagnosis not present

## 2020-01-19 DIAGNOSIS — A419 Sepsis, unspecified organism: Secondary | ICD-10-CM | POA: Diagnosis not present

## 2020-01-19 DIAGNOSIS — R4020 Unspecified coma: Secondary | ICD-10-CM | POA: Diagnosis not present

## 2020-01-19 DIAGNOSIS — R55 Syncope and collapse: Secondary | ICD-10-CM | POA: Diagnosis not present

## 2020-01-19 DIAGNOSIS — Z20822 Contact with and (suspected) exposure to covid-19: Secondary | ICD-10-CM | POA: Diagnosis not present

## 2020-01-19 DIAGNOSIS — K5909 Other constipation: Secondary | ICD-10-CM | POA: Diagnosis not present

## 2020-01-25 ENCOUNTER — Telehealth: Payer: Self-pay

## 2020-01-25 NOTE — Telephone Encounter (Signed)
Transition Care Management Follow-up Telephone Call   Date discharged? 01/23/2020               How have you been since you were released from the hospital? hasn't been feeling well. Arm is hurting. hasnt had a bm since being home. No appetite.   Do you understand why you were in the hospital? Sodium had dropped, potassium dropped, iron was low,   Do you understand the discharge instructions? yes   Where were you discharged to? home   Items Reviewed:  Medications reviewed: yes  Allergies reviewed: yes  Dietary changes reviewed: yes  Referrals reviewed: needs referrals when she comes in for toc appt   Functional Questionnaire:   Activities of Daily Living (ADLs):  has help if needed    Any transportation issues/concerns?: no   Any patient concerns? concerned about what's going on.   Confirmed importance and date/time of follow-up visits scheduled 01/29/2020 at 10am     Confirmed with patient if condition begins to worsen call PCP or go to the ER.  Patient was given the office number and encouraged to call back with question or concerns.  :  Yes with verbal understanding.

## 2020-01-26 DIAGNOSIS — A419 Sepsis, unspecified organism: Secondary | ICD-10-CM | POA: Diagnosis not present

## 2020-01-26 DIAGNOSIS — K5909 Other constipation: Secondary | ICD-10-CM | POA: Diagnosis not present

## 2020-01-26 DIAGNOSIS — D696 Thrombocytopenia, unspecified: Secondary | ICD-10-CM | POA: Diagnosis not present

## 2020-01-26 DIAGNOSIS — E871 Hypo-osmolality and hyponatremia: Secondary | ICD-10-CM | POA: Diagnosis not present

## 2020-01-26 DIAGNOSIS — Z9181 History of falling: Secondary | ICD-10-CM | POA: Diagnosis not present

## 2020-01-26 DIAGNOSIS — I10 Essential (primary) hypertension: Secondary | ICD-10-CM | POA: Diagnosis not present

## 2020-01-26 DIAGNOSIS — N390 Urinary tract infection, site not specified: Secondary | ICD-10-CM | POA: Diagnosis not present

## 2020-01-26 DIAGNOSIS — R296 Repeated falls: Secondary | ICD-10-CM | POA: Diagnosis not present

## 2020-01-26 DIAGNOSIS — Z7984 Long term (current) use of oral hypoglycemic drugs: Secondary | ICD-10-CM | POA: Diagnosis not present

## 2020-01-26 DIAGNOSIS — B964 Proteus (mirabilis) (morganii) as the cause of diseases classified elsewhere: Secondary | ICD-10-CM | POA: Diagnosis not present

## 2020-01-26 DIAGNOSIS — E785 Hyperlipidemia, unspecified: Secondary | ICD-10-CM | POA: Diagnosis not present

## 2020-01-26 DIAGNOSIS — E1165 Type 2 diabetes mellitus with hyperglycemia: Secondary | ICD-10-CM | POA: Diagnosis not present

## 2020-01-28 DIAGNOSIS — N390 Urinary tract infection, site not specified: Secondary | ICD-10-CM | POA: Diagnosis not present

## 2020-01-28 DIAGNOSIS — Z9181 History of falling: Secondary | ICD-10-CM | POA: Diagnosis not present

## 2020-01-28 DIAGNOSIS — E785 Hyperlipidemia, unspecified: Secondary | ICD-10-CM | POA: Diagnosis not present

## 2020-01-28 DIAGNOSIS — R296 Repeated falls: Secondary | ICD-10-CM | POA: Diagnosis not present

## 2020-01-28 DIAGNOSIS — Z7984 Long term (current) use of oral hypoglycemic drugs: Secondary | ICD-10-CM | POA: Diagnosis not present

## 2020-01-28 DIAGNOSIS — B964 Proteus (mirabilis) (morganii) as the cause of diseases classified elsewhere: Secondary | ICD-10-CM | POA: Diagnosis not present

## 2020-01-28 DIAGNOSIS — E871 Hypo-osmolality and hyponatremia: Secondary | ICD-10-CM | POA: Diagnosis not present

## 2020-01-28 DIAGNOSIS — A419 Sepsis, unspecified organism: Secondary | ICD-10-CM | POA: Diagnosis not present

## 2020-01-28 DIAGNOSIS — I10 Essential (primary) hypertension: Secondary | ICD-10-CM | POA: Diagnosis not present

## 2020-01-28 DIAGNOSIS — K5909 Other constipation: Secondary | ICD-10-CM | POA: Diagnosis not present

## 2020-01-28 DIAGNOSIS — D696 Thrombocytopenia, unspecified: Secondary | ICD-10-CM | POA: Diagnosis not present

## 2020-01-28 DIAGNOSIS — E1165 Type 2 diabetes mellitus with hyperglycemia: Secondary | ICD-10-CM | POA: Diagnosis not present

## 2020-01-29 ENCOUNTER — Ambulatory Visit (INDEPENDENT_AMBULATORY_CARE_PROVIDER_SITE_OTHER): Payer: Medicare Other | Admitting: Family Medicine

## 2020-01-29 ENCOUNTER — Encounter: Payer: Self-pay | Admitting: Family Medicine

## 2020-01-29 ENCOUNTER — Other Ambulatory Visit: Payer: Self-pay

## 2020-01-29 ENCOUNTER — Telehealth: Payer: Self-pay

## 2020-01-29 VITALS — BP 120/80 | HR 105 | Temp 97.1°F | Resp 15 | Ht 64.0 in | Wt 129.8 lb

## 2020-01-29 DIAGNOSIS — Z7689 Persons encountering health services in other specified circumstances: Secondary | ICD-10-CM

## 2020-01-29 DIAGNOSIS — Z9119 Patient's noncompliance with other medical treatment and regimen: Secondary | ICD-10-CM

## 2020-01-29 DIAGNOSIS — E559 Vitamin D deficiency, unspecified: Secondary | ICD-10-CM | POA: Diagnosis not present

## 2020-01-29 DIAGNOSIS — Z91199 Patient's noncompliance with other medical treatment and regimen due to unspecified reason: Secondary | ICD-10-CM

## 2020-01-29 DIAGNOSIS — K5900 Constipation, unspecified: Secondary | ICD-10-CM | POA: Diagnosis not present

## 2020-01-29 DIAGNOSIS — R300 Dysuria: Secondary | ICD-10-CM | POA: Insufficient documentation

## 2020-01-29 DIAGNOSIS — E1165 Type 2 diabetes mellitus with hyperglycemia: Secondary | ICD-10-CM

## 2020-01-29 LAB — POCT URINALYSIS DIP (CLINITEK)
Bilirubin, UA: NEGATIVE
Blood, UA: NEGATIVE
Glucose, UA: 250 mg/dL — AB
Ketones, POC UA: NEGATIVE mg/dL
Nitrite, UA: NEGATIVE
POC PROTEIN,UA: NEGATIVE
Spec Grav, UA: 1.02 (ref 1.010–1.025)
Urobilinogen, UA: 0.2 E.U./dL
pH, UA: 5 (ref 5.0–8.0)

## 2020-01-29 MED ORDER — ACCU-CHEK GUIDE VI STRP: ORAL_STRIP | 0 refills | Status: DC

## 2020-01-29 MED ORDER — METFORMIN HCL 500 MG PO TABS: 500.0000 mg | ORAL_TABLET | Freq: Every day | ORAL | 1 refills | Status: DC

## 2020-01-29 NOTE — Assessment & Plan Note (Signed)
Not controlled, [facility] checked A1c it has dropped to 9.9% from 15.5% , but she has a big weight change, and lack of appetite. She has not been taking medications and feels bad daily.  Referral for nutritionist help as she is very holistic driven and does not like meds. Extensive education about the need that she will probably have to be on some medication but she needs to eat otherwise she will not be able to tolerate them.  She is open to getting the assistance that she needs hopefully a nutritionist can help her get back on track.  Will have close follow-up in 4 weeks in office in 2 weeks for lab work.  Encouraged her to increase her protein, fiber, healthy carbohydrate intake.  In addition to hydrating with water.  Reviewed side effects, risks and benefits of medication.   Patient acknowledged agreement and understanding of the plan.

## 2020-01-29 NOTE — Assessment & Plan Note (Signed)
Needs updated labs ordered today.

## 2020-01-29 NOTE — Telephone Encounter (Signed)
[id number] personal Care services Copied Sleeved Noted

## 2020-01-29 NOTE — Assessment & Plan Note (Signed)
Has a history of being noncompliant with medications.  Previously was not taking medication to help with her diabetes she is now reporting that she cannot tolerate taking the medication most likely related to not eating secondary to having uncontrolled diabetes.  I explained that this is a cervical that is feeding into itself and she needs to break the cycle somehow.  Provider ordered referral to nutritionist today hopefully some diet changes can be made so that she can tolerate taking her medications.

## 2020-01-29 NOTE — Assessment & Plan Note (Signed)
Was treated with Keflex at [facility].  Recently finished her treatment of this.  Advised for her to continue to drink water and maintain good hydration in addition to good hygiene.  If symptoms do not subside will provide treatment, sending culture out to verify appropriate medication.

## 2020-01-29 NOTE — Patient Instructions (Addendum)
I appreciate the opportunity to provide you with care for your health and wellness. Today we discussed: recent hospital stay  Follow up: 4 weeks   Labs in 2 weeks  Referral today to Nutritionist   Increase protein, fiber, and fluid intake. You need to start eating better-your body will not tolerate medications for diabetes without food in your system.  Benefiber can help with constipation-add to fluids and drink.  Continue with probiotic. We will look at stomach doctor if nothing changes after you start eating better.  Please continue to practice social distancing to keep you, your family, and our community safe.  If you must go out, please wear a mask and practice good handwashing.  It was a pleasure to see you and I look forward to continuing to work together on your health and well-being. Please do not hesitate to call the office if you need care or have questions about your care.  Have a wonderful day and week. With Gratitude, Cherly Beach, DNP, AGNP-BC

## 2020-01-29 NOTE — Assessment & Plan Note (Signed)
Has orders to get labs checked but has not gotten them checked.  Subsequently went to emergency room several times for constipation.  Most likely related to not eating properly.  We will follow-up with GI if needed once we get her diet back on track.  She does have a pretty extreme weight loss so at next appointment if not eating well will consider doing a GI referral to make sure that were not missing something there. Patient acknowledged agreement and understanding of the plan.

## 2020-01-29 NOTE — Assessment & Plan Note (Addendum)
Patient was seen at [place] [facility].  Review of notes, labs, orders. Updated labs ordered.  Treatments provided today.  See documentation.  And close follow-up in 4 weeks.  She never got labs ordered back in January.  Advised for her to get these labs ordered in the next week so that we can see what possibly might be going on.

## 2020-01-29 NOTE — Progress Notes (Signed)
Subjective:  Patient ID: [name], female    DOB: 1956/05/04  Age: 64 y.o. MRN: [id number]  CC:  Chief Complaint  Patient presents with  . Transitions Of Care    hospital follow up due to syncope related to low potassium, sodium and iron   . Anorexia    has no appetite and stays nauseated       HPI  HPI Ms. Ostermeyer is a 64 year old female patient of Dr. Moshe Cipro.  Who is well-known to the clinic.  Presented to [facility] health care in [facility] on February 16.  With altered mental status.  She was found to have severe hyponatremia with a sodium of 112 and was also thought to be septic with an elevated lactic acid.  History was given by sister that the patient stopped using prescription meds about a year ago and she has been using her herbal supplements.  Over the course of the last year Dr. Moshe Cipro and myself have tried multiple times to get her to take care of her diabetes and other illnesses and chronic conditions.  She was admitted for hyponatremia, UTI sepsis.  Treated with Rocephin x5 doses.  Blood cultures were negative.  Mental status improved and she became alert and oriented post treatment.  Sodium levels at discharge were 135.  She was taking p.o. well and had a good appetite.  She was evaluated and recommended for SNF but patient son would like her to come home with the family who are going to help care for her.  She is received home health services organized by case management.  And is discharged on Ceftin and Zofran for nausea.  She is advised to get a nephrology referral when seeing Dr. Moshe Cipro for follow-up.  Today she reports that she completed her medication but still feels like she has urinary discomfort when voiding.  She is not monitoring her blood sugar as she should.  And is not taking her medications as she should as she reports that she gets upset stomach.  Additionally she reports that she has stopped taking the Metformin because it makes her  stomach upset.  She has lost a significant amount of weight in the last several months.  Reports that she can only eat about 1 meal a day and has a poor appetite.  She is open to me sending her to nutritionist.   She really needs to be followed by nephrology and endocrinology as well.  She reports still having some constipation but is unable to drink a lot of fluids.  I have encouraged her to start taking Benefiber and a probiotic.  She reports that she will look into these.  She needs to start eating better so that she can maintain her medications to help with her other chronic conditions.  Family is here today and reports that they are going to try to help get her taking care of.  And help her with her medications.  Today patient denies signs and symptoms of COVID 19 infection including fever, chills, cough, shortness of breath, and headache. Past Medical, Surgical, Social History, Allergies, and Medications have been Reviewed.   Past Medical History:  Diagnosis Date  . Allergic rhinitis   . ALLERGIC RHINITIS, SEASONAL 02/27/2008   Qualifier: Diagnosis of  By: Rebecca Eaton LPN, [place] Cerise    . Anemia, deficiency   . Annual physical exam 08/15/2015  . Diabetes mellitus   . Educated about COVID-19 virus infection 05/01/2019  . GERD (gastroesophageal  reflux disease)   . Hyperlipidemia   . Hypertension   . Obesity   . Post-menopausal bleeding 08/15/2015    Current Meds  Medication Sig  . aspirin 81 MG tablet Take 162 mg by mouth daily.   . Calcium-Vitamin D (SUPER CALCIUM/D) 600-125 MG-UNIT TABS Take 1 tablet by mouth daily.   . cefUROXime (CEFTIN) 250 MG tablet Take 250 mg by mouth 2 (two) times daily.  Marland Kitchen Echinacea-Goldenseal (ECHINACEA COMB/GOLDEN SEAL PO) Take by mouth.  . Garlic Oil [id number] MG CAPS Take 1 capsule by mouth daily.   Marland Kitchen glipiZIDE (GLUCOTROL XL) 10 MG 24 hr tablet Take 1 tablet (10 mg total) by mouth daily with breakfast.  . Lancets MISC 1 each by Does not apply route as directed.  .  metFORMIN (GLUCOPHAGE) 500 MG tablet Take 1 tablet (500 mg total) by mouth daily with breakfast. MUST TAKE WITH FOOD  . Multiple Vitamins-Minerals (MULTIPLE VITAMINS/WOMENS) tablet Take 1 tablet by mouth daily.    . ondansetron (ZOFRAN) 4 MG tablet Take 1 tablet (4 mg total) by mouth every 8 (eight) hours as needed for nausea or vomiting.  Marland Kitchen OVER THE COUNTER MEDICATION Take 200 mcg by mouth daily. Chromium  . OVER THE COUNTER MEDICATION Take 4 capsules by mouth daily. Colostrum  . polycarbophil (FIBERCON) 625 MG tablet Take 1 tablet (625 mg total) by mouth daily.  . ramipril (ALTACE) 2.5 MG capsule Take 1 capsule (2.5 mg total) by mouth daily.  . rosuvastatin (CRESTOR) 5 MG tablet Take 1 tablet (5 mg total) by mouth daily.  . [DISCONTINUED] metFORMIN (GLUCOPHAGE) 500 MG tablet Take by mouth.    ROS:  Review of Systems  HENT: Negative.   Eyes: Negative.   Respiratory: Negative.   Cardiovascular: Negative.   Gastrointestinal: Negative.   Genitourinary: Negative.   Musculoskeletal: Negative.   Skin: Negative.   Neurological: Negative.   Endo/Heme/Allergies: Negative.   Psychiatric/Behavioral: Negative.   All other systems reviewed and are negative.    Objective:   Today's Vitals: BP 120/80   Pulse (!) 105   Temp (!) 97.1 F (36.2 C) (Temporal)   Resp 15   Ht 5\' 4"  (1.626 m)   Wt 129 lb 12.8 oz (58.9 kg)   SpO2 98%   BMI 22.28 kg/m  Vitals with BMI 01/29/2020 01/11/2020 12/24/2019  Height 5\' 4"  - 5\' 4"   Weight 129 lbs 13 oz - 139 lbs  BMI 123XX123 - 0000000  Systolic 123456 123456 123456  Diastolic 80 80 74  Pulse 123456 85 94     Physical Exam Vitals and nursing note reviewed.  Constitutional:      Appearance: Normal appearance. She is well-developed and well-groomed. She is obese.  HENT:     Head: Normocephalic and atraumatic.     Right Ear: External ear normal.     Left Ear: External ear normal.     Mouth/Throat:     Comments: Mask in place  Eyes:     General:        Right eye:  No discharge.        Left eye: No discharge.     Conjunctiva/sclera: Conjunctivae normal.  Cardiovascular:     Rate and Rhythm: Normal rate and regular rhythm.     Pulses: Normal pulses.     Heart sounds: Normal heart sounds.  Pulmonary:     Effort: Pulmonary effort is normal.     Breath sounds: Normal breath sounds.  Musculoskeletal:  General: Normal range of motion.     Cervical back: Normal range of motion and neck supple.  Skin:    General: Skin is warm.  Neurological:     General: No focal deficit present.     Mental Status: She is alert and oriented to person, place, and time.  Psychiatric:        Attention and Perception: Attention normal.        Mood and Affect: Mood normal.        Speech: Speech normal.        Behavior: Behavior normal. Behavior is cooperative.        Thought Content: Thought content normal.        Cognition and Memory: Cognition normal.        Judgment: Judgment normal.     Assessment   1. Encounter for support and coordination of transition of care   2. Uncontrolled type 2 diabetes mellitus with hyperglycemia ([place])   3. Vitamin D deficiency   4. Constipation, unspecified constipation type   5. Burning with urination   6. Non compliance with medical treatment     Tests ordered Orders Placed This Encounter  Procedures  . Urine Culture  . CBC  . COMPLETE METABOLIC PANEL WITH GFR  . VITAMIN D 25 Hydroxy (Vit-D Deficiency, Fractures)  . Amb Referral to Nutrition and Diabetic E  . POCT URINALYSIS DIP (CLINITEK)     Plan: Please see assessment and plan per problem list above.   Meds ordered this encounter  Medications  . glucose blood (ACCU-CHEK GUIDE) test strip    Sig: USE 1 STRIP TO CHECK GLUCOSE TWICE DAILY    Dispense:  200 each    Refill:  0    Order Specific Question:   Supervising Provider    Answer:   SIMPSON, MARGARET E [id number]  . metFORMIN (GLUCOPHAGE) 500 MG tablet    Sig: Take 1 tablet (500 mg total) by mouth daily  with breakfast. MUST TAKE WITH FOOD    Dispense:  30 tablet    Refill:  1    Order Specific Question:   Supervising Provider    Answer:   [name] [id number]    Patient to follow-up in 4 weeks  Perlie Mayo, NP

## 2020-02-01 ENCOUNTER — Other Ambulatory Visit (HOSPITAL_COMMUNITY)
Admission: RE | Admit: 2020-02-01 | Discharge: 2020-02-01 | Disposition: A | Discharge status: Home / Self Care | Payer: Medicare Other | Source: Other Acute Inpatient Hospital | Attending: Family Medicine | Admitting: Family Medicine

## 2020-02-01 DIAGNOSIS — R300 Dysuria: Secondary | ICD-10-CM | POA: Insufficient documentation

## 2020-02-02 DIAGNOSIS — N390 Urinary tract infection, site not specified: Secondary | ICD-10-CM | POA: Diagnosis not present

## 2020-02-02 DIAGNOSIS — Z9181 History of falling: Secondary | ICD-10-CM | POA: Diagnosis not present

## 2020-02-02 DIAGNOSIS — D696 Thrombocytopenia, unspecified: Secondary | ICD-10-CM | POA: Diagnosis not present

## 2020-02-02 DIAGNOSIS — E785 Hyperlipidemia, unspecified: Secondary | ICD-10-CM | POA: Diagnosis not present

## 2020-02-02 DIAGNOSIS — E871 Hypo-osmolality and hyponatremia: Secondary | ICD-10-CM | POA: Diagnosis not present

## 2020-02-02 DIAGNOSIS — I10 Essential (primary) hypertension: Secondary | ICD-10-CM | POA: Diagnosis not present

## 2020-02-02 DIAGNOSIS — R296 Repeated falls: Secondary | ICD-10-CM | POA: Diagnosis not present

## 2020-02-02 DIAGNOSIS — K5909 Other constipation: Secondary | ICD-10-CM | POA: Diagnosis not present

## 2020-02-02 DIAGNOSIS — E1165 Type 2 diabetes mellitus with hyperglycemia: Secondary | ICD-10-CM | POA: Diagnosis not present

## 2020-02-02 DIAGNOSIS — Z7984 Long term (current) use of oral hypoglycemic drugs: Secondary | ICD-10-CM | POA: Diagnosis not present

## 2020-02-02 DIAGNOSIS — B964 Proteus (mirabilis) (morganii) as the cause of diseases classified elsewhere: Secondary | ICD-10-CM | POA: Diagnosis not present

## 2020-02-02 DIAGNOSIS — A419 Sepsis, unspecified organism: Secondary | ICD-10-CM | POA: Diagnosis not present

## 2020-02-02 LAB — URINE CULTURE: Culture: NO GROWTH

## 2020-02-03 ENCOUNTER — Telehealth: Payer: Self-pay

## 2020-02-03 ENCOUNTER — Other Ambulatory Visit: Payer: Self-pay | Admitting: *Deleted

## 2020-02-03 DIAGNOSIS — E1165 Type 2 diabetes mellitus with hyperglycemia: Secondary | ICD-10-CM | POA: Diagnosis not present

## 2020-02-03 DIAGNOSIS — Z9181 History of falling: Secondary | ICD-10-CM | POA: Diagnosis not present

## 2020-02-03 DIAGNOSIS — Z7984 Long term (current) use of oral hypoglycemic drugs: Secondary | ICD-10-CM | POA: Diagnosis not present

## 2020-02-03 DIAGNOSIS — I10 Essential (primary) hypertension: Secondary | ICD-10-CM | POA: Diagnosis not present

## 2020-02-03 DIAGNOSIS — B964 Proteus (mirabilis) (morganii) as the cause of diseases classified elsewhere: Secondary | ICD-10-CM | POA: Diagnosis not present

## 2020-02-03 DIAGNOSIS — K5909 Other constipation: Secondary | ICD-10-CM | POA: Diagnosis not present

## 2020-02-03 DIAGNOSIS — D696 Thrombocytopenia, unspecified: Secondary | ICD-10-CM | POA: Diagnosis not present

## 2020-02-03 DIAGNOSIS — N390 Urinary tract infection, site not specified: Secondary | ICD-10-CM | POA: Diagnosis not present

## 2020-02-03 DIAGNOSIS — E785 Hyperlipidemia, unspecified: Secondary | ICD-10-CM | POA: Diagnosis not present

## 2020-02-03 DIAGNOSIS — A419 Sepsis, unspecified organism: Secondary | ICD-10-CM | POA: Diagnosis not present

## 2020-02-03 DIAGNOSIS — R296 Repeated falls: Secondary | ICD-10-CM | POA: Diagnosis not present

## 2020-02-03 DIAGNOSIS — E871 Hypo-osmolality and hyponatremia: Secondary | ICD-10-CM | POA: Diagnosis not present

## 2020-02-03 MED ORDER — UNABLE TO FIND: 0 refills | Status: DC

## 2020-02-03 NOTE — Telephone Encounter (Signed)
[facility] is requesting a rollator and 3 in 1 commode be sent to [phone]. Ok to send scripts for this DME?

## 2020-02-03 NOTE — Telephone Encounter (Signed)
Scripts sent

## 2020-02-04 DIAGNOSIS — E871 Hypo-osmolality and hyponatremia: Secondary | ICD-10-CM | POA: Diagnosis not present

## 2020-02-04 DIAGNOSIS — E785 Hyperlipidemia, unspecified: Secondary | ICD-10-CM | POA: Diagnosis not present

## 2020-02-04 DIAGNOSIS — A419 Sepsis, unspecified organism: Secondary | ICD-10-CM | POA: Diagnosis not present

## 2020-02-04 DIAGNOSIS — N390 Urinary tract infection, site not specified: Secondary | ICD-10-CM | POA: Diagnosis not present

## 2020-02-04 DIAGNOSIS — B964 Proteus (mirabilis) (morganii) as the cause of diseases classified elsewhere: Secondary | ICD-10-CM | POA: Diagnosis not present

## 2020-02-04 DIAGNOSIS — E119 Type 2 diabetes mellitus without complications: Secondary | ICD-10-CM

## 2020-02-04 DIAGNOSIS — I10 Essential (primary) hypertension: Secondary | ICD-10-CM | POA: Diagnosis not present

## 2020-02-04 DIAGNOSIS — K5909 Other constipation: Secondary | ICD-10-CM | POA: Diagnosis not present

## 2020-02-04 DIAGNOSIS — E1165 Type 2 diabetes mellitus with hyperglycemia: Secondary | ICD-10-CM | POA: Diagnosis not present

## 2020-02-04 DIAGNOSIS — D696 Thrombocytopenia, unspecified: Secondary | ICD-10-CM | POA: Diagnosis not present

## 2020-02-04 DIAGNOSIS — Z9181 History of falling: Secondary | ICD-10-CM | POA: Diagnosis not present

## 2020-02-04 DIAGNOSIS — Z7984 Long term (current) use of oral hypoglycemic drugs: Secondary | ICD-10-CM | POA: Diagnosis not present

## 2020-02-04 DIAGNOSIS — R296 Repeated falls: Secondary | ICD-10-CM | POA: Diagnosis not present

## 2020-02-08 DIAGNOSIS — E1165 Type 2 diabetes mellitus with hyperglycemia: Secondary | ICD-10-CM | POA: Diagnosis not present

## 2020-02-08 DIAGNOSIS — E785 Hyperlipidemia, unspecified: Secondary | ICD-10-CM | POA: Diagnosis not present

## 2020-02-08 DIAGNOSIS — B964 Proteus (mirabilis) (morganii) as the cause of diseases classified elsewhere: Secondary | ICD-10-CM | POA: Diagnosis not present

## 2020-02-08 DIAGNOSIS — Z7984 Long term (current) use of oral hypoglycemic drugs: Secondary | ICD-10-CM | POA: Diagnosis not present

## 2020-02-08 DIAGNOSIS — R296 Repeated falls: Secondary | ICD-10-CM | POA: Diagnosis not present

## 2020-02-08 DIAGNOSIS — N390 Urinary tract infection, site not specified: Secondary | ICD-10-CM | POA: Diagnosis not present

## 2020-02-08 DIAGNOSIS — K5909 Other constipation: Secondary | ICD-10-CM | POA: Diagnosis not present

## 2020-02-08 DIAGNOSIS — I10 Essential (primary) hypertension: Secondary | ICD-10-CM | POA: Diagnosis not present

## 2020-02-08 DIAGNOSIS — E871 Hypo-osmolality and hyponatremia: Secondary | ICD-10-CM | POA: Diagnosis not present

## 2020-02-08 DIAGNOSIS — D696 Thrombocytopenia, unspecified: Secondary | ICD-10-CM | POA: Diagnosis not present

## 2020-02-08 DIAGNOSIS — A419 Sepsis, unspecified organism: Secondary | ICD-10-CM | POA: Diagnosis not present

## 2020-02-08 DIAGNOSIS — Z9181 History of falling: Secondary | ICD-10-CM | POA: Diagnosis not present

## 2020-02-10 DIAGNOSIS — Z9181 History of falling: Secondary | ICD-10-CM | POA: Diagnosis not present

## 2020-02-10 DIAGNOSIS — E871 Hypo-osmolality and hyponatremia: Secondary | ICD-10-CM | POA: Diagnosis not present

## 2020-02-10 DIAGNOSIS — N390 Urinary tract infection, site not specified: Secondary | ICD-10-CM | POA: Diagnosis not present

## 2020-02-10 DIAGNOSIS — K5909 Other constipation: Secondary | ICD-10-CM | POA: Diagnosis not present

## 2020-02-10 DIAGNOSIS — D696 Thrombocytopenia, unspecified: Secondary | ICD-10-CM | POA: Diagnosis not present

## 2020-02-10 DIAGNOSIS — A419 Sepsis, unspecified organism: Secondary | ICD-10-CM | POA: Diagnosis not present

## 2020-02-10 DIAGNOSIS — Z7984 Long term (current) use of oral hypoglycemic drugs: Secondary | ICD-10-CM | POA: Diagnosis not present

## 2020-02-10 DIAGNOSIS — E1165 Type 2 diabetes mellitus with hyperglycemia: Secondary | ICD-10-CM | POA: Diagnosis not present

## 2020-02-10 DIAGNOSIS — E785 Hyperlipidemia, unspecified: Secondary | ICD-10-CM | POA: Diagnosis not present

## 2020-02-10 DIAGNOSIS — B964 Proteus (mirabilis) (morganii) as the cause of diseases classified elsewhere: Secondary | ICD-10-CM | POA: Diagnosis not present

## 2020-02-10 DIAGNOSIS — R296 Repeated falls: Secondary | ICD-10-CM | POA: Diagnosis not present

## 2020-02-10 DIAGNOSIS — I10 Essential (primary) hypertension: Secondary | ICD-10-CM | POA: Diagnosis not present

## 2020-02-11 DIAGNOSIS — E1165 Type 2 diabetes mellitus with hyperglycemia: Secondary | ICD-10-CM

## 2020-02-11 DIAGNOSIS — I10 Essential (primary) hypertension: Secondary | ICD-10-CM | POA: Diagnosis not present

## 2020-02-11 DIAGNOSIS — A419 Sepsis, unspecified organism: Secondary | ICD-10-CM

## 2020-02-11 DIAGNOSIS — D696 Thrombocytopenia, unspecified: Secondary | ICD-10-CM

## 2020-02-11 DIAGNOSIS — Z7984 Long term (current) use of oral hypoglycemic drugs: Secondary | ICD-10-CM

## 2020-02-11 DIAGNOSIS — E785 Hyperlipidemia, unspecified: Secondary | ICD-10-CM

## 2020-02-11 DIAGNOSIS — E871 Hypo-osmolality and hyponatremia: Secondary | ICD-10-CM

## 2020-02-11 DIAGNOSIS — Z9181 History of falling: Secondary | ICD-10-CM

## 2020-02-11 DIAGNOSIS — K5909 Other constipation: Secondary | ICD-10-CM

## 2020-02-11 DIAGNOSIS — B964 Proteus (mirabilis) (morganii) as the cause of diseases classified elsewhere: Secondary | ICD-10-CM

## 2020-02-11 DIAGNOSIS — R296 Repeated falls: Secondary | ICD-10-CM

## 2020-02-11 DIAGNOSIS — N390 Urinary tract infection, site not specified: Secondary | ICD-10-CM

## 2020-02-15 DIAGNOSIS — A419 Sepsis, unspecified organism: Secondary | ICD-10-CM | POA: Diagnosis not present

## 2020-02-15 DIAGNOSIS — E785 Hyperlipidemia, unspecified: Secondary | ICD-10-CM | POA: Diagnosis not present

## 2020-02-15 DIAGNOSIS — N390 Urinary tract infection, site not specified: Secondary | ICD-10-CM | POA: Diagnosis not present

## 2020-02-15 DIAGNOSIS — R296 Repeated falls: Secondary | ICD-10-CM | POA: Diagnosis not present

## 2020-02-15 DIAGNOSIS — D696 Thrombocytopenia, unspecified: Secondary | ICD-10-CM | POA: Diagnosis not present

## 2020-02-15 DIAGNOSIS — E1165 Type 2 diabetes mellitus with hyperglycemia: Secondary | ICD-10-CM | POA: Diagnosis not present

## 2020-02-15 DIAGNOSIS — E871 Hypo-osmolality and hyponatremia: Secondary | ICD-10-CM | POA: Diagnosis not present

## 2020-02-15 DIAGNOSIS — Z7984 Long term (current) use of oral hypoglycemic drugs: Secondary | ICD-10-CM | POA: Diagnosis not present

## 2020-02-15 DIAGNOSIS — K5909 Other constipation: Secondary | ICD-10-CM | POA: Diagnosis not present

## 2020-02-15 DIAGNOSIS — I10 Essential (primary) hypertension: Secondary | ICD-10-CM | POA: Diagnosis not present

## 2020-02-15 DIAGNOSIS — Z9181 History of falling: Secondary | ICD-10-CM | POA: Diagnosis not present

## 2020-02-15 DIAGNOSIS — B964 Proteus (mirabilis) (morganii) as the cause of diseases classified elsewhere: Secondary | ICD-10-CM | POA: Diagnosis not present

## 2020-02-18 ENCOUNTER — Other Ambulatory Visit: Payer: Self-pay

## 2020-02-18 DIAGNOSIS — K5909 Other constipation: Secondary | ICD-10-CM | POA: Diagnosis not present

## 2020-02-18 DIAGNOSIS — A419 Sepsis, unspecified organism: Secondary | ICD-10-CM | POA: Diagnosis not present

## 2020-02-18 DIAGNOSIS — D696 Thrombocytopenia, unspecified: Secondary | ICD-10-CM | POA: Diagnosis not present

## 2020-02-18 DIAGNOSIS — E785 Hyperlipidemia, unspecified: Secondary | ICD-10-CM | POA: Diagnosis not present

## 2020-02-18 DIAGNOSIS — E871 Hypo-osmolality and hyponatremia: Secondary | ICD-10-CM | POA: Diagnosis not present

## 2020-02-18 DIAGNOSIS — R296 Repeated falls: Secondary | ICD-10-CM | POA: Diagnosis not present

## 2020-02-18 DIAGNOSIS — Z9181 History of falling: Secondary | ICD-10-CM | POA: Diagnosis not present

## 2020-02-18 DIAGNOSIS — E1165 Type 2 diabetes mellitus with hyperglycemia: Secondary | ICD-10-CM | POA: Diagnosis not present

## 2020-02-18 DIAGNOSIS — N390 Urinary tract infection, site not specified: Secondary | ICD-10-CM | POA: Diagnosis not present

## 2020-02-18 DIAGNOSIS — I10 Essential (primary) hypertension: Secondary | ICD-10-CM | POA: Diagnosis not present

## 2020-02-18 DIAGNOSIS — B964 Proteus (mirabilis) (morganii) as the cause of diseases classified elsewhere: Secondary | ICD-10-CM | POA: Diagnosis not present

## 2020-02-18 DIAGNOSIS — Z7984 Long term (current) use of oral hypoglycemic drugs: Secondary | ICD-10-CM | POA: Diagnosis not present

## 2020-02-18 NOTE — Patient Outreach (Signed)
[facility]) Care Management  02/18/2020  JARIELYS REINER 18-Nov-1956 [id number]   Medication Adherence call to Mrs. [name] HIPPA Compliant Voice message left with a call back number. Mrs. Urbanski is showing past due on Ramipril 2.5 mg under [place].   [place] Management Direct Dial [phone]  Fax [phone] [email or web address]

## 2020-02-19 DIAGNOSIS — R296 Repeated falls: Secondary | ICD-10-CM | POA: Diagnosis not present

## 2020-02-19 DIAGNOSIS — N390 Urinary tract infection, site not specified: Secondary | ICD-10-CM | POA: Diagnosis not present

## 2020-02-19 DIAGNOSIS — I10 Essential (primary) hypertension: Secondary | ICD-10-CM | POA: Diagnosis not present

## 2020-02-19 DIAGNOSIS — Z7984 Long term (current) use of oral hypoglycemic drugs: Secondary | ICD-10-CM | POA: Diagnosis not present

## 2020-02-19 DIAGNOSIS — Z9181 History of falling: Secondary | ICD-10-CM | POA: Diagnosis not present

## 2020-02-19 DIAGNOSIS — B964 Proteus (mirabilis) (morganii) as the cause of diseases classified elsewhere: Secondary | ICD-10-CM | POA: Diagnosis not present

## 2020-02-19 DIAGNOSIS — D696 Thrombocytopenia, unspecified: Secondary | ICD-10-CM | POA: Diagnosis not present

## 2020-02-19 DIAGNOSIS — K5909 Other constipation: Secondary | ICD-10-CM | POA: Diagnosis not present

## 2020-02-19 DIAGNOSIS — E871 Hypo-osmolality and hyponatremia: Secondary | ICD-10-CM | POA: Diagnosis not present

## 2020-02-19 DIAGNOSIS — E785 Hyperlipidemia, unspecified: Secondary | ICD-10-CM | POA: Diagnosis not present

## 2020-02-19 DIAGNOSIS — E1165 Type 2 diabetes mellitus with hyperglycemia: Secondary | ICD-10-CM | POA: Diagnosis not present

## 2020-02-19 DIAGNOSIS — A419 Sepsis, unspecified organism: Secondary | ICD-10-CM | POA: Diagnosis not present

## 2020-02-22 DIAGNOSIS — E871 Hypo-osmolality and hyponatremia: Secondary | ICD-10-CM | POA: Diagnosis not present

## 2020-02-22 DIAGNOSIS — N390 Urinary tract infection, site not specified: Secondary | ICD-10-CM | POA: Diagnosis not present

## 2020-02-22 DIAGNOSIS — B964 Proteus (mirabilis) (morganii) as the cause of diseases classified elsewhere: Secondary | ICD-10-CM | POA: Diagnosis not present

## 2020-02-22 DIAGNOSIS — I10 Essential (primary) hypertension: Secondary | ICD-10-CM | POA: Diagnosis not present

## 2020-02-22 DIAGNOSIS — K5909 Other constipation: Secondary | ICD-10-CM | POA: Diagnosis not present

## 2020-02-22 DIAGNOSIS — A419 Sepsis, unspecified organism: Secondary | ICD-10-CM | POA: Diagnosis not present

## 2020-02-22 DIAGNOSIS — R296 Repeated falls: Secondary | ICD-10-CM | POA: Diagnosis not present

## 2020-02-22 DIAGNOSIS — Z7984 Long term (current) use of oral hypoglycemic drugs: Secondary | ICD-10-CM | POA: Diagnosis not present

## 2020-02-22 DIAGNOSIS — E785 Hyperlipidemia, unspecified: Secondary | ICD-10-CM | POA: Diagnosis not present

## 2020-02-22 DIAGNOSIS — D696 Thrombocytopenia, unspecified: Secondary | ICD-10-CM | POA: Diagnosis not present

## 2020-02-22 DIAGNOSIS — Z9181 History of falling: Secondary | ICD-10-CM | POA: Diagnosis not present

## 2020-02-22 DIAGNOSIS — E1165 Type 2 diabetes mellitus with hyperglycemia: Secondary | ICD-10-CM | POA: Diagnosis not present

## 2020-02-23 DIAGNOSIS — Z9181 History of falling: Secondary | ICD-10-CM | POA: Diagnosis not present

## 2020-02-23 DIAGNOSIS — E1165 Type 2 diabetes mellitus with hyperglycemia: Secondary | ICD-10-CM | POA: Diagnosis not present

## 2020-02-23 DIAGNOSIS — K5909 Other constipation: Secondary | ICD-10-CM | POA: Diagnosis not present

## 2020-02-23 DIAGNOSIS — D696 Thrombocytopenia, unspecified: Secondary | ICD-10-CM | POA: Diagnosis not present

## 2020-02-23 DIAGNOSIS — I10 Essential (primary) hypertension: Secondary | ICD-10-CM | POA: Diagnosis not present

## 2020-02-23 DIAGNOSIS — Z7984 Long term (current) use of oral hypoglycemic drugs: Secondary | ICD-10-CM | POA: Diagnosis not present

## 2020-02-23 DIAGNOSIS — E785 Hyperlipidemia, unspecified: Secondary | ICD-10-CM | POA: Diagnosis not present

## 2020-02-23 DIAGNOSIS — A419 Sepsis, unspecified organism: Secondary | ICD-10-CM | POA: Diagnosis not present

## 2020-02-23 DIAGNOSIS — E871 Hypo-osmolality and hyponatremia: Secondary | ICD-10-CM | POA: Diagnosis not present

## 2020-02-23 DIAGNOSIS — R296 Repeated falls: Secondary | ICD-10-CM | POA: Diagnosis not present

## 2020-02-23 DIAGNOSIS — N390 Urinary tract infection, site not specified: Secondary | ICD-10-CM | POA: Diagnosis not present

## 2020-02-23 DIAGNOSIS — B964 Proteus (mirabilis) (morganii) as the cause of diseases classified elsewhere: Secondary | ICD-10-CM | POA: Diagnosis not present

## 2020-02-24 DIAGNOSIS — I10 Essential (primary) hypertension: Secondary | ICD-10-CM | POA: Diagnosis not present

## 2020-02-24 DIAGNOSIS — K5909 Other constipation: Secondary | ICD-10-CM | POA: Diagnosis not present

## 2020-02-24 DIAGNOSIS — E871 Hypo-osmolality and hyponatremia: Secondary | ICD-10-CM | POA: Diagnosis not present

## 2020-02-24 DIAGNOSIS — A419 Sepsis, unspecified organism: Secondary | ICD-10-CM | POA: Diagnosis not present

## 2020-02-24 DIAGNOSIS — R296 Repeated falls: Secondary | ICD-10-CM | POA: Diagnosis not present

## 2020-02-24 DIAGNOSIS — B964 Proteus (mirabilis) (morganii) as the cause of diseases classified elsewhere: Secondary | ICD-10-CM | POA: Diagnosis not present

## 2020-02-24 DIAGNOSIS — E785 Hyperlipidemia, unspecified: Secondary | ICD-10-CM | POA: Diagnosis not present

## 2020-02-24 DIAGNOSIS — D696 Thrombocytopenia, unspecified: Secondary | ICD-10-CM | POA: Diagnosis not present

## 2020-02-24 DIAGNOSIS — Z7984 Long term (current) use of oral hypoglycemic drugs: Secondary | ICD-10-CM | POA: Diagnosis not present

## 2020-02-24 DIAGNOSIS — E1165 Type 2 diabetes mellitus with hyperglycemia: Secondary | ICD-10-CM | POA: Diagnosis not present

## 2020-02-24 DIAGNOSIS — Z9181 History of falling: Secondary | ICD-10-CM | POA: Diagnosis not present

## 2020-02-24 DIAGNOSIS — N390 Urinary tract infection, site not specified: Secondary | ICD-10-CM | POA: Diagnosis not present

## 2020-02-25 ENCOUNTER — Other Ambulatory Visit: Payer: Self-pay

## 2020-02-25 ENCOUNTER — Encounter: Payer: Self-pay | Admitting: Family Medicine

## 2020-02-25 ENCOUNTER — Ambulatory Visit (HOSPITAL_COMMUNITY)
Admission: RE | Admit: 2020-02-25 | Discharge: 2020-02-25 | Disposition: A | Discharge status: Home / Self Care | Payer: Medicare Other | Source: Ambulatory Visit | Attending: Family Medicine | Admitting: Family Medicine

## 2020-02-25 ENCOUNTER — Ambulatory Visit (INDEPENDENT_AMBULATORY_CARE_PROVIDER_SITE_OTHER): Payer: Medicare Other | Admitting: Family Medicine

## 2020-02-25 VITALS — BP 140/80 | HR 92 | Temp 96.2°F | Resp 15 | Ht 64.0 in | Wt 125.0 lb

## 2020-02-25 DIAGNOSIS — E1165 Type 2 diabetes mellitus with hyperglycemia: Secondary | ICD-10-CM | POA: Diagnosis not present

## 2020-02-25 DIAGNOSIS — M25511 Pain in right shoulder: Secondary | ICD-10-CM

## 2020-02-25 DIAGNOSIS — E782 Mixed hyperlipidemia: Secondary | ICD-10-CM | POA: Diagnosis not present

## 2020-02-25 DIAGNOSIS — B351 Tinea unguium: Secondary | ICD-10-CM | POA: Diagnosis not present

## 2020-02-25 MED ORDER — SITAGLIPTIN PHOSPHATE 25 MG PO TABS: 25.0000 mg | ORAL_TABLET | Freq: Every day | ORAL | 1 refills | Status: DC

## 2020-02-25 NOTE — Addendum Note (Signed)
Addended by: Eual Fines on: 02/25/2020 03:07 PM   Modules accepted: Orders

## 2020-02-25 NOTE — Assessment & Plan Note (Signed)
Appears to have deformity of the right shoulder.  Possible injury unsure what is going on we will get x-ray and refer to Ortho if needed.  In the past she did have a fall in regard back in 2017 and had been seen by Dr. Aline Brochure at that time I believe at least she reports this is being so.  She is willing to go back to Dr. Aline Brochure if need be.

## 2020-02-25 NOTE — Assessment & Plan Note (Signed)
Sent in [place] did not want to take Metformin.  Secondary to possible weight loss as she is having trouble gaining weight though she reports she is not eating very well.  I have advised for her to start eating better and use boost or some type of supplement to help with intake.

## 2020-02-25 NOTE — Assessment & Plan Note (Signed)
She has ongoing toe fungus.  Referral to podiatry for assistance of this.

## 2020-02-25 NOTE — Assessment & Plan Note (Signed)
Needs updated labs.  Encouraged a low-fat diet.  Though she is having trouble maintaining weight so as long as her cholesterol levels stay okay I am happy to let her eat what she wants to keep her weight stable.

## 2020-02-25 NOTE — Patient Instructions (Addendum)
I appreciate the opportunity to provide you with care for your health and wellness. Today we discussed: diabetes and shoulder pain, and nail fungus  Follow up: Please move 4/13 appt out by 12 weeks  Labs as soon as can-fasting  Referral to foot doctor  Xray for shoulder today at [facility]  Please work on eating better. Your weight is still dropping.  Please continue to practice social distancing to keep you, your family, and our community safe.  If you must go out, please wear a mask and practice good handwashing.  It was a pleasure to see you and I look forward to continuing to work together on your health and well-being. Please do not hesitate to call the office if you need care or have questions about your care.  Have a wonderful day and week. With Gratitude, Cherly Beach, DNP, AGNP-BC

## 2020-02-25 NOTE — Progress Notes (Signed)
Subjective:  Patient ID: [name], female    DOB: [birth year]  Age: 64 y.o. MRN: [id number]  CC:  Chief Complaint  Patient presents with  . Shoulder Pain    right shoulder pain started when she passed out and EMS took her to the hospitall   . Nail Problem    has fungus on right toenails and a place on her left thumb       HPI  HPI   Ms. Hussong presents today because she has shoulder pain and nail problems.  She has a pretty significant history includes uncontrolled untreated diabetes, hypertension, hyperlipidemia among others.  Most recently was in the hospital please see previous note for transition of care and information regarding that.  During this visit when she went to the hospital she ended up having the shoulder discomfort and injury to the right side.  She says the physical therapist that she has been working with reports that it looks like it is popped out or something has changed within the shoulder clavicle area.  She has trouble with movement and laying on it describes as achy and prickly.  She reports it hurts all the time is relieved by use of Aspercreme.  She is unsure if she fell when she passed out or if the EMS pulled it when they had to lift her up.  Nail problem: Reports that she looks like she has little bit of a fungus on her toenails and needs to have them trimmed.  Has not tried anything for this.  Has not had any injury to her toes in the recent years.   Diabetes: Reports that she started taking her mother Januvia.  Reported that the Metformin she did not take because she did not want to lose extra weight.  She needs to have the Januvia sent in though previous years when she had it sent in by Dr. Dorris Fetch it was too expensive.  She is willing to try to see if it is covered now versus try something else.  Weight loss: Has lost more weight in the last 4 weeks than she had around 4 to 5 pounds.  Is steadily decreasing secondary to not eating very well.  Probably  secondary to not having good control of her diabetes.  Reports that she has been trying to eat better and use her supplements and protein supplements to help her but recently just started doing this.  Today patient denies signs and symptoms of COVID 19 infection including fever, chills, cough, shortness of breath, and headache. Past Medical, Surgical, Social History, Allergies, and Medications have been Reviewed.   Past Medical History:  Diagnosis Date  . Allergic rhinitis   . ALLERGIC RHINITIS, SEASONAL 02/27/2008   Qualifier: Diagnosis of  By: Rebecca Eaton LPN, [place] Cerise    . Anemia, deficiency   . Annual physical exam 08/15/2015  . Diabetes mellitus   . Educated about COVID-19 virus infection 05/01/2019  . GERD (gastroesophageal reflux disease)   . Hyperlipidemia   . Hypertension   . Obesity   . Post-menopausal bleeding 08/15/2015    Current Meds  Medication Sig  . aspirin 81 MG tablet Take 162 mg by mouth daily.   . Calcium-Vitamin D (SUPER CALCIUM/D) 600-125 MG-UNIT TABS Take 1 tablet by mouth daily.   Marland Kitchen Echinacea-Goldenseal (ECHINACEA COMB/GOLDEN SEAL PO) Take by mouth.  . Garlic Oil [id number] MG CAPS Take 1 capsule by mouth daily.   Marland Kitchen glipiZIDE (GLUCOTROL XL) 10 MG 24 hr tablet  Take 1 tablet (10 mg total) by mouth daily with breakfast.  . glucose blood (ACCU-CHEK GUIDE) test strip USE 1 STRIP TO CHECK GLUCOSE TWICE DAILY  . Lancets MISC 1 each by Does not apply route as directed.  . Multiple Vitamins-Minerals (MULTIPLE VITAMINS/WOMENS) tablet Take 1 tablet by mouth daily.    . ondansetron (ZOFRAN) 4 MG tablet Take 1 tablet (4 mg total) by mouth every 8 (eight) hours as needed for nausea or vomiting.  Marland Kitchen OVER THE COUNTER MEDICATION Take 1 capsule by mouth daily. Leafy Half  . OVER THE COUNTER MEDICATION Take 200 mcg by mouth daily. Chromium  . OVER THE COUNTER MEDICATION Take 4 capsules by mouth daily. Colostrum  . polycarbophil (FIBERCON) 625 MG tablet Take 1 tablet (625 mg total) by mouth daily.   . ramipril (ALTACE) 2.5 MG capsule Take 1 capsule (2.5 mg total) by mouth daily.  . Turmeric 500 MG TABS Take 2 tablets by mouth 3 (three) times daily.  Marland Kitchen UNABLE TO FIND Heating pad  . [DISCONTINUED] UNABLE TO FIND Electric blanket  . [DISCONTINUED] UNABLE TO FIND Rollator x 1  DX covid 19, weakness  . [DISCONTINUED] UNABLE TO FIND 3 in 1 bedside commode x 1  DX covid 19, weakness    ROS:  Review of Systems  Constitutional: Positive for weight loss.  HENT: Negative.   Eyes: Negative.   Respiratory: Negative.   Cardiovascular: Negative.   Gastrointestinal: Negative.   Genitourinary: Negative.   Musculoskeletal: Positive for joint pain.  Skin: Negative.        Nail fungus  Neurological: Negative.   Endo/Heme/Allergies: Negative.   Psychiatric/Behavioral: Negative.   All other systems reviewed and are negative.    Objective:   Today's Vitals: BP 140/80   Pulse 92   Temp (!) 96.2 F (35.7 C) (Temporal)   Resp 15   Ht 5\' 4"  (1.626 m)   Wt 125 lb (56.7 kg)   SpO2 97%   BMI 21.46 kg/m  Vitals with BMI 02/25/2020 01/29/2020 01/11/2020  Height 5\' 4"  5\' 4"  -  Weight 125 lbs 129 lbs 13 oz -  BMI A999333 123XX123 -  Systolic [id number] 123456 123456  Diastolic 80 80 80  Pulse 92 105 85     Physical Exam Vitals and nursing note reviewed.  Constitutional:      Appearance: Normal appearance. She is well-developed, well-groomed and normal weight.  HENT:     Head: Normocephalic and atraumatic.     Right Ear: External ear normal.     Left Ear: External ear normal.     Mouth/Throat:     Comments: Mask in place Eyes:     General:        Right eye: No discharge.        Left eye: No discharge.     Conjunctiva/sclera: Conjunctivae normal.  Cardiovascular:     Rate and Rhythm: Normal rate and regular rhythm.     Pulses: Normal pulses.     Heart sounds: Normal heart sounds.  Pulmonary:     Effort: Pulmonary effort is normal.     Breath sounds: Normal breath sounds.  Musculoskeletal:      Right shoulder: Deformity and tenderness present.     Cervical back: Normal range of motion and neck supple.  Feet:     Right foot:     Toenail Condition: Right toenails are abnormally thick and long. Fungal disease present.    Left foot:     Toenail Condition: Left toenails  are abnormally thick and long. Fungal disease present. Skin:    General: Skin is warm.  Neurological:     General: No focal deficit present.     Mental Status: She is alert and oriented to person, place, and time.  Psychiatric:        Attention and Perception: Attention normal.        Mood and Affect: Mood normal.        Speech: Speech normal.        Behavior: Behavior normal. Behavior is cooperative.        Thought Content: Thought content normal.        Cognition and Memory: Cognition normal.        Judgment: Judgment normal.     Assessment   1. Uncontrolled type 2 diabetes mellitus with hyperglycemia ([place])   2. Acute pain of right shoulder   3. Nail fungus   4. Mixed hyperlipidemia     Tests ordered Orders Placed This Encounter  Procedures  . Lipid panel  . Ambulatory referral to Podiatry    Plan: Please see assessment and plan per problem list above.   Meds ordered this encounter  Medications  . sitaGLIPtin (JANUVIA) 25 MG tablet    Sig: Take 1 tablet (25 mg total) by mouth daily.    Dispense:  30 tablet    Refill:  1    Order Specific Question:   Supervising Provider    Answer:   Jacklynn Bue    Patient to follow-up in 06/07/2020  Perlie Mayo, NP

## 2020-02-26 DIAGNOSIS — E782 Mixed hyperlipidemia: Secondary | ICD-10-CM | POA: Diagnosis not present

## 2020-02-26 DIAGNOSIS — E1165 Type 2 diabetes mellitus with hyperglycemia: Secondary | ICD-10-CM | POA: Diagnosis not present

## 2020-02-26 DIAGNOSIS — E559 Vitamin D deficiency, unspecified: Secondary | ICD-10-CM | POA: Diagnosis not present

## 2020-02-27 LAB — LIPID PANEL
Cholesterol: 167 mg/dL (ref <200)
HDL: 52 mg/dL (ref >=50)
LDL Cholesterol (Calc): 99 mg/dL (calc)
Non-HDL Cholesterol (Calc): 115 mg/dL (calc) (ref <130)
Total CHOL/HDL Ratio: 3.2 (calc) (ref <5.0)
Triglycerides: 69 mg/dL (ref <150)

## 2020-02-27 LAB — CBC
HCT: 39.3 % (ref 35.0–45.0)
Hemoglobin: 12.7 g/dL (ref 11.7–15.5)
MCH: 27.3 pg (ref 27.0–33.0)
MCHC: 32.3 g/dL (ref 32.0–36.0)
MCV: 84.3 fL (ref 80.0–100.0)
MPV: 14 fL — ABNORMAL HIGH (ref 7.5–12.5)
Platelets: 193 10*3/uL (ref 140–400)
RBC: 4.66 10*6/uL (ref 3.80–5.10)
RDW: 13.8 % (ref 11.0–15.0)
WBC: 9.3 10*3/uL (ref 3.8–10.8)

## 2020-02-27 LAB — COMPLETE METABOLIC PANEL WITH GFR
AG Ratio: 1.5 (calc) (ref 1.0–2.5)
ALT: 15 U/L (ref 6–29)
AST: 19 U/L (ref 10–35)
Albumin: 4.2 g/dL (ref 3.6–5.1)
Alkaline phosphatase (APISO): 67 U/L (ref 37–153)
BUN/Creatinine Ratio: 33 (calc) — ABNORMAL HIGH (ref 6–22)
BUN: 15 mg/dL (ref 7–25)
CO2: 26 mmol/L (ref 20–32)
Calcium: 9.6 mg/dL (ref 8.6–10.4)
Chloride: 100 mmol/L (ref 98–110)
Creat: 0.45 mg/dL — ABNORMAL LOW (ref 0.50–0.99)
GFR, Est African American: 122 mL/min/{1.73_m2} (ref >=60)
GFR, Est Non African American: 105 mL/min/{1.73_m2} (ref >=60)
Globulin: 2.8 g/dL (calc) (ref 1.9–3.7)
Glucose, Bld: 190 mg/dL — ABNORMAL HIGH (ref 65–99)
Potassium: 4 mmol/L (ref 3.5–5.3)
Sodium: 137 mmol/L (ref 135–146)
Total Bilirubin: 0.4 mg/dL (ref 0.2–1.2)
Total Protein: 7 g/dL (ref 6.1–8.1)

## 2020-02-27 LAB — VITAMIN D 25 HYDROXY (VIT D DEFICIENCY, FRACTURES): Vit D, 25-Hydroxy: 47 ng/mL (ref 30–100)

## 2020-03-01 ENCOUNTER — Other Ambulatory Visit: Payer: Self-pay | Admitting: Family Medicine

## 2020-03-01 MED ORDER — ROSUVASTATIN CALCIUM 10 MG PO TABS: 5.0000 mg | ORAL_TABLET | Freq: Every day | ORAL | 1 refills | Status: DC

## 2020-03-04 DIAGNOSIS — I10 Essential (primary) hypertension: Secondary | ICD-10-CM | POA: Diagnosis not present

## 2020-03-04 DIAGNOSIS — A419 Sepsis, unspecified organism: Secondary | ICD-10-CM | POA: Diagnosis not present

## 2020-03-04 DIAGNOSIS — R296 Repeated falls: Secondary | ICD-10-CM | POA: Diagnosis not present

## 2020-03-04 DIAGNOSIS — Z7984 Long term (current) use of oral hypoglycemic drugs: Secondary | ICD-10-CM | POA: Diagnosis not present

## 2020-03-04 DIAGNOSIS — K5909 Other constipation: Secondary | ICD-10-CM | POA: Diagnosis not present

## 2020-03-04 DIAGNOSIS — Z9181 History of falling: Secondary | ICD-10-CM | POA: Diagnosis not present

## 2020-03-04 DIAGNOSIS — D696 Thrombocytopenia, unspecified: Secondary | ICD-10-CM | POA: Diagnosis not present

## 2020-03-04 DIAGNOSIS — E1165 Type 2 diabetes mellitus with hyperglycemia: Secondary | ICD-10-CM | POA: Diagnosis not present

## 2020-03-04 DIAGNOSIS — B964 Proteus (mirabilis) (morganii) as the cause of diseases classified elsewhere: Secondary | ICD-10-CM | POA: Diagnosis not present

## 2020-03-04 DIAGNOSIS — E785 Hyperlipidemia, unspecified: Secondary | ICD-10-CM | POA: Diagnosis not present

## 2020-03-04 DIAGNOSIS — E871 Hypo-osmolality and hyponatremia: Secondary | ICD-10-CM | POA: Diagnosis not present

## 2020-03-04 DIAGNOSIS — N390 Urinary tract infection, site not specified: Secondary | ICD-10-CM | POA: Diagnosis not present

## 2020-03-07 ENCOUNTER — Other Ambulatory Visit: Payer: Self-pay

## 2020-03-07 ENCOUNTER — Ambulatory Visit (INDEPENDENT_AMBULATORY_CARE_PROVIDER_SITE_OTHER): Payer: Medicare Other | Admitting: Internal Medicine

## 2020-03-07 ENCOUNTER — Encounter (INDEPENDENT_AMBULATORY_CARE_PROVIDER_SITE_OTHER): Payer: Self-pay | Admitting: Internal Medicine

## 2020-03-07 VITALS — BP 128/68 | HR 109 | Temp 97.3°F | Ht 63.0 in | Wt 123.4 lb

## 2020-03-07 DIAGNOSIS — E1165 Type 2 diabetes mellitus with hyperglycemia: Secondary | ICD-10-CM

## 2020-03-07 DIAGNOSIS — R634 Abnormal weight loss: Secondary | ICD-10-CM | POA: Diagnosis not present

## 2020-03-07 MED ORDER — TURMERIC 500 MG PO TABS: 1.0000 | ORAL_TABLET | Freq: Two times a day (BID) | ORAL | Status: DC

## 2020-03-07 MED ORDER — PANTOPRAZOLE SODIUM 40 MG PO TBEC: 40.0000 mg | DELAYED_RELEASE_TABLET | Freq: Every day | ORAL | 5 refills | Status: DC

## 2020-03-07 NOTE — H&P (View-Only) (Signed)
Presenting complaint;  Abdominal pain nausea change in bowel habits and weight loss.  History of present illness.  Patient is 64 year old African-American female who was referred through courtesy of Dr. Tula Nakayama for GI evaluation. Patient has multiple complaints.  She gives 1 year history of constipation which has been off and on.  She reports experiencing 2 occasions where she had no bowel movement for 7 days.  She believes she got dehydrated which resulted in constipation.  She took OTC laxative which helped.  Second episode occurred about 2 months ago.  She has been watching her diet and taking stool softener and not having problems anymore.  She denies rectal bleeding.  Last colonoscopy was about 13 years ago.  She reports procedure was done at [facility] but I cannot find any record. She also complains of nausea anorexia and postprandial periumbilical pain which started in December 2020.  She also feels bloating.  She has been using Mylanta which has not helped.  She reports "gas" in chest but denies heartburn.  She has not experienced vomiting or melena.  She also complains of dysphagia to solids but has not had any episode of food impaction.  She feels her diabetic control is much better.  She says her glucose levels run between 130s and 200.  She usually checks her glucose levels twice a day.  She was supposed to have hemoglobin A1c as recommended by Ms. Annabelle's, NP but has not been done.  She is also concerned if she has thyroid disease.  She is also having issues with her teeth.  She is unable to chew.  She is in the process of getting dental problems taken care of.  She is on low-dose aspirin but does not take other OTC NSAIDs.  She is not sure why she is taking so many supplements. Review of the systems is positive for 22 pound weight loss in the last 4 months. Review of the systems is negative for fever chills or night sweats.  Current Medications: Outpatient Encounter  Medications as of 03/07/2020  Medication Sig  . aspirin 81 MG tablet Take 81 mg by mouth daily.   . Calcium-Vitamin D (SUPER CALCIUM/D) 600-125 MG-UNIT TABS Take 1 tablet by mouth daily.   Marland Kitchen docusate sodium (COLACE) 100 MG capsule Take 100 mg by mouth at bedtime.  Marland Kitchen ECHINACEA HERB PO Take 760 mg by mouth. Patient states that she takes 1 -2 a day  . Echinacea-Goldenseal (ECHINACEA COMB/GOLDEN SEAL PO) Take by mouth.  . Garlic Oil 132 MG CAPS Take 1 capsule by mouth at bedtime.   Marland Kitchen glipiZIDE (GLUCOTROL XL) 10 MG 24 hr tablet Take 1 tablet (10 mg total) by mouth daily with breakfast.  . glucose blood (ACCU-CHEK GUIDE) test strip USE 1 STRIP TO CHECK GLUCOSE TWICE DAILY  . Lancets MISC 1 each by Does not apply route as directed.  . Multiple Vitamins-Minerals (MULTIPLE VITAMINS/WOMENS) tablet Take 1 tablet by mouth daily.    . Omega-3 Fatty Acids (FISH OIL PO) Take by mouth daily.  . ondansetron (ZOFRAN) 4 MG tablet Take 1 tablet (4 mg total) by mouth every 8 (eight) hours as needed for nausea or vomiting.  Marland Kitchen OVER THE COUNTER MEDICATION Take 5 capsules by mouth daily. Colostrum - Every morning before a meal  . OVER THE COUNTER MEDICATION Probiotic GX  ForGas - patient states that she takes as needed.  Marland Kitchen OVER THE COUNTER MEDICATION Gentle Iron 25 mg - Patient takes 1 a day.  Marland Kitchen  Probiotic Product (PROBIOTIC DAILY PO) Take by mouth daily.  . ramipril (ALTACE) 2.5 MG capsule Take 1 capsule (2.5 mg total) by mouth daily.  . rosuvastatin (CRESTOR) 10 MG tablet Take 0.5 tablets (5 mg total) by mouth daily.  Marland Kitchen VITAMIN D PO Take 5,000 Units by mouth daily.  . metFORMIN (GLUCOPHAGE) 500 MG tablet Take 1 tablet (500 mg total) by mouth daily with breakfast. MUST TAKE WITH FOOD (Patient not taking: Reported on 02/25/2020)  . OVER THE COUNTER MEDICATION Take 1 capsule by mouth daily. Leafy Half  . OVER THE COUNTER MEDICATION Take 200 mcg by mouth daily. Chromium  . polycarbophil (FIBERCON) 625 MG tablet Take 1 tablet  (625 mg total) by mouth daily. (Patient not taking: Reported on 03/07/2020)  . sitaGLIPtin (JANUVIA) 25 MG tablet Take 1 tablet (25 mg total) by mouth daily. (Patient not taking: Reported on 03/07/2020)  . Turmeric 500 MG TABS Take 2 tablets by mouth 3 (three) times daily.  Marland Kitchen UNABLE TO FIND Heating pad (Patient not taking: Reported on 03/07/2020)   No facility-administered encounter medications on file as of 03/07/2020.   Past medical history  Chronic low back pain. Diabetes mellitus is 2010. Allergic rhinitis. Hyperlipidemia. History of iron deficiency anemia. Screening colonoscopy reportedly normal in 2008. Esophagogastroduodenoscopy in 2011 with foreign body removal.  Splenectomy in 1998.  Patient states no abnormality was discovered. Surgery for ingrown left great toenail in 2014.   Allergies Allergies  Allergen Reactions  . Codeine     Family history  Father died at age 37.  He worked in [employer] on his life and had black lung. Mother is 66 years old.  She is diabetic and has peripheral vascular disease. She has 3 brothers.  One is diabetic and the other 2 are in good health. She has 5 sisters.  Older sister is borderline diabetic.  2 sisters have been treated for breast carcinoma and remains in remission.  1 sister is diabetic.  1 sister is not in good health but patient does not know any details.  Social history  Patient is divorced.  She works at [employer] for 25 years and now on disability because of chronic back pain.  She has 1 son age 73 years old and she has 3 grandchildren.  She has never smoked cigarettes or drank alcohol.  Physical examination  Blood pressure 128/68, pulse (!) 109, temperature (!) 97.3 F (36.3 C), temperature source Temporal, height _0  (1.6 m), weight 123 lb 6.4 oz (56 kg). Patient is alert and in no acute distress. She is wearing a facial mask. Conjunctiva is pink. Sclera is nonicteric Oropharyngeal mucosa is normal. She has a partial  upper dental plate and few poor teeth no trauma. No neck masses or thyromegaly noted. Cardiac exam with regular rhythm normal S1 and S2. No murmur or gallop noted. Lungs are clear to auscultation. Abdomen is symmetrical.  There is long scar below the left costal margin extending into the left flank.  Bowel sounds are normal.  On palpation abdomen is soft.  She has mild midepigastric and periumbilical tenderness.  No organomegaly or masses. No LE edema or clubbing noted.  Labs/studies Results:  CBC Latest Ref Rng & Units 02/26/2020 07/23/2019 07/23/2019  WBC 3.8 - 10.8 Thousand/uL 9.3 11.0(H) 11.9(H)  Hemoglobin 11.7 - 15.5 g/dL 12.7 13.5 14.0  Hematocrit 35.0 - 45.0 % 39.3 42.3 43.3  Platelets 140 - 400 Thousand/uL 193 188 202    CMP Latest Ref Rng & Units  02/26/2020 12/28/2019 07/23/2019  Glucose 65 - 99 mg/dL 190(H) 290(H) 971(HH)  BUN 7 - 25 mg/dL _0 Creatinine 0.50 - 0.99 mg/dL 0.45(L) 0.66 0.82  Sodium 135 - 146 mmol/L 137 125(L) 123(L)  Potassium 3.5 - 5.3 mmol/L 4.0 3.3(L) 4.7  Chloride 98 - 110 mmol/L 100 90(L) 87(L)  CO2 20 - 32 mmol/L _1 Calcium 8.6 - 10.4 mg/dL 9.6 9.3 8.9  Total Protein 6.1 - 8.1 g/dL 7.0 - -  Total Bilirubin 0.2 - 1.2 mg/dL 0.4 - -  Alkaline Phos 38 - 126 U/L - - -  AST 10 - 35 U/L 19 - -  ALT 6 - 29 U/L 15 - -    Hepatic Function Latest Ref Rng & Units 02/26/2020 07/23/2019 10/28/2017  Total Protein 6.1 - 8.1 g/dL 7.0 7.9 7.7  Albumin 3.5 - 5.0 g/dL - 3.9 -  AST 10 - 35 U/L 19 32 18  ALT 6 - 29 U/L 15 53(H) 20  Alk Phosphatase 38 - 126 U/L - 107 -  Total Bilirubin 0.2 - 1.2 mg/dL 0.4 0.6 0.3  Bilirubin, Direct <=0.2 mg/dL - - -     Assessment:  #1.  Nausea and postprandial periumbilical pain.  She also has been losing weight which is very worrisome.  She is on multiple supplements which may be contributing to some of her symptoms.  I would recommend mostly should be discontinued at least temporarily.  Differential diagnosis includes  peptic ulcer disease or even neoplasm given her weight loss.  Since she is also experiencing dysphagia it would be reasonable to proceed with esophagogastroduodenoscopy before considering CT and order abdominal ultrasound.  #2.  Esophageal dysphagia.  It remains to be seen of dysphagia as because she is not able to chew her food effectively or she could also have ring or a stricture.  Would consider esophageal dilation at the time of EGD unless there is contraindication to do so.  #3.  History of constipation.  She appears to be doing well  with stool softener and dietary measures.  Patient advised not to take OTC preparation containing senna as active ingredient.  #4.  Patient is average risk for CRC.  Screening colonoscopy would be delayed until acute symptomatology has been addressed.  Plan:  Patient advised to decrease turmeric dose to 500 mg twice daily. She will continue Colace, probiotic and vitamin D as before but for the time being discontinue all the other supplements. Begin pantoprazole 40 mg by mouth 30 minutes before breakfast daily. Patient will go to the lab for hemoglobin A1c and TSH. Esophagogastroduodenoscopy with possible esophageal dilation in near future.

## 2020-03-07 NOTE — Patient Instructions (Signed)
Please do not take herbal medicines have been deleted from your list for the time being. Take pantoprazole 40 mg by mouth 30 minutes before breakfast daily. Physician will call with results of blood test when completed. Esophagogastroduodenoscopy to be scheduled.

## 2020-03-07 NOTE — Progress Notes (Signed)
Presenting complaint;  Abdominal pain nausea change in bowel habits and weight loss.  History of present illness.  Patient is 64 year old African-American female who was referred through courtesy of Dr. Tula Nakayama for GI evaluation. Patient has multiple complaints.  She gives 1 year history of constipation which has been off and on.  She reports experiencing 2 occasions where she had no bowel movement for 7 days.  She believes she got dehydrated which resulted in constipation.  She took OTC laxative which helped.  Second episode occurred about 2 months ago.  She has been watching her diet and taking stool softener and not having problems anymore.  She denies rectal bleeding.  Last colonoscopy was about 13 years ago.  She reports procedure was done at [facility] but I cannot find any record. She also complains of nausea anorexia and postprandial periumbilical pain which started in December 2020.  She also feels bloating.  She has been using Mylanta which has not helped.  She reports "gas" in chest but denies heartburn.  She has not experienced vomiting or melena.  She also complains of dysphagia to solids but has not had any episode of food impaction.  She feels her diabetic control is much better.  She says her glucose levels run between 130s and 200.  She usually checks her glucose levels twice a day.  She was supposed to have hemoglobin A1c as recommended by Ms. Annabelle's, NP but has not been done.  She is also concerned if she has thyroid disease.  She is also having issues with her teeth.  She is unable to chew.  She is in the process of getting dental problems taken care of.  She is on low-dose aspirin but does not take other OTC NSAIDs.  She is not sure why she is taking so many supplements. Review of the systems is positive for 22 pound weight loss in the last 4 months. Review of the systems is negative for fever chills or night sweats.  Current Medications: Outpatient Encounter  Medications as of 03/07/2020  Medication Sig  . aspirin 81 MG tablet Take 81 mg by mouth daily.   . Calcium-Vitamin D (SUPER CALCIUM/D) 600-125 MG-UNIT TABS Take 1 tablet by mouth daily.   Marland Kitchen docusate sodium (COLACE) 100 MG capsule Take 100 mg by mouth at bedtime.  Marland Kitchen ECHINACEA HERB PO Take 760 mg by mouth. Patient states that she takes 1 -2 a day  . Echinacea-Goldenseal (ECHINACEA COMB/GOLDEN SEAL PO) Take by mouth.  . Garlic Oil 694 MG CAPS Take 1 capsule by mouth at bedtime.   Marland Kitchen glipiZIDE (GLUCOTROL XL) 10 MG 24 hr tablet Take 1 tablet (10 mg total) by mouth daily with breakfast.  . glucose blood (ACCU-CHEK GUIDE) test strip USE 1 STRIP TO CHECK GLUCOSE TWICE DAILY  . Lancets MISC 1 each by Does not apply route as directed.  . Multiple Vitamins-Minerals (MULTIPLE VITAMINS/WOMENS) tablet Take 1 tablet by mouth daily.    . Omega-3 Fatty Acids (FISH OIL PO) Take by mouth daily.  . ondansetron (ZOFRAN) 4 MG tablet Take 1 tablet (4 mg total) by mouth every 8 (eight) hours as needed for nausea or vomiting.  Marland Kitchen OVER THE COUNTER MEDICATION Take 5 capsules by mouth daily. Colostrum - Every morning before a meal  . OVER THE COUNTER MEDICATION Probiotic GX  ForGas - patient states that she takes as needed.  Marland Kitchen OVER THE COUNTER MEDICATION Gentle Iron 25 mg - Patient takes 1 a day.  Marland Kitchen  Probiotic Product (PROBIOTIC DAILY PO) Take by mouth daily.  . ramipril (ALTACE) 2.5 MG capsule Take 1 capsule (2.5 mg total) by mouth daily.  . rosuvastatin (CRESTOR) 10 MG tablet Take 0.5 tablets (5 mg total) by mouth daily.  . VITAMIN D PO Take 5,000 Units by mouth daily.  . metFORMIN (GLUCOPHAGE) 500 MG tablet Take 1 tablet (500 mg total) by mouth daily with breakfast. MUST TAKE WITH FOOD (Patient not taking: Reported on 02/25/2020)  . OVER THE COUNTER MEDICATION Take 1 capsule by mouth daily. Urisense  . OVER THE COUNTER MEDICATION Take 200 mcg by mouth daily. Chromium  . polycarbophil (FIBERCON) 625 MG tablet Take 1 tablet  (625 mg total) by mouth daily. (Patient not taking: Reported on 03/07/2020)  . sitaGLIPtin (JANUVIA) 25 MG tablet Take 1 tablet (25 mg total) by mouth daily. (Patient not taking: Reported on 03/07/2020)  . Turmeric 500 MG TABS Take 2 tablets by mouth 3 (three) times daily.  . UNABLE TO FIND Heating pad (Patient not taking: Reported on 03/07/2020)   No facility-administered encounter medications on file as of 03/07/2020.   Past medical history  Chronic low back pain. Diabetes mellitus is 2010. Allergic rhinitis. Hyperlipidemia. History of iron deficiency anemia. Screening colonoscopy reportedly normal in 2008. Esophagogastroduodenoscopy in 2011 with foreign body removal.  Splenectomy in 1998.  Patient states no abnormality was discovered. Surgery for ingrown left great toenail in 2014.   Allergies Allergies  Allergen Reactions  . Codeine     Family history  Father died at age 83.  He worked in coal mine on his life and had black lung. Mother is 89 years old.  She is diabetic and has peripheral vascular disease. She has 3 brothers.  One is diabetic and the other 2 are in good health. She has 5 sisters.  Older sister is borderline diabetic.  2 sisters have been treated for breast carcinoma and remains in remission.  1 sister is diabetic.  1 sister is not in good health but patient does not know any details.  Social history  Patient is divorced.  She works at [employer] for 25 years and now on disability because of chronic back pain.  She has 1 son age 38 years old and she has 3 grandchildren.  She has never smoked cigarettes or drank alcohol.  Physical examination  Blood pressure 128/68, pulse (!) 109, temperature (!) 97.3 F (36.3 C), temperature source Temporal, height 5' 3" (1.6 m), weight 123 lb 6.4 oz (56 kg). Patient is alert and in no acute distress. She is wearing a facial mask. Conjunctiva is pink. Sclera is nonicteric Oropharyngeal mucosa is normal. She has a partial  upper dental plate and few poor teeth no trauma. No neck masses or thyromegaly noted. Cardiac exam with regular rhythm normal S1 and S2. No murmur or gallop noted. Lungs are clear to auscultation. Abdomen is symmetrical.  There is long scar below the left costal margin extending into the left flank.  Bowel sounds are normal.  On palpation abdomen is soft.  She has mild midepigastric and periumbilical tenderness.  No organomegaly or masses. No LE edema or clubbing noted.  Labs/studies Results:  CBC Latest Ref Rng & Units 02/26/2020 07/23/2019 07/23/2019  WBC 3.8 - 10.8 Thousand/uL 9.3 11.0(H) 11.9(H)  Hemoglobin 11.7 - 15.5 g/dL 12.7 13.5 14.0  Hematocrit 35.0 - 45.0 % 39.3 42.3 43.3  Platelets 140 - 400 Thousand/uL 193 188 202    CMP Latest Ref Rng & Units   02/26/2020 12/28/2019 07/23/2019  Glucose 65 - 99 mg/dL 190(H) 290(H) 971(HH)  BUN 7 - 25 mg/dL _0 Creatinine 0.50 - 0.99 mg/dL 0.45(L) 0.66 0.82  Sodium 135 - 146 mmol/L 137 125(L) 123(L)  Potassium 3.5 - 5.3 mmol/L 4.0 3.3(L) 4.7  Chloride 98 - 110 mmol/L 100 90(L) 87(L)  CO2 20 - 32 mmol/L _1 Calcium 8.6 - 10.4 mg/dL 9.6 9.3 8.9  Total Protein 6.1 - 8.1 g/dL 7.0 - -  Total Bilirubin 0.2 - 1.2 mg/dL 0.4 - -  Alkaline Phos 38 - 126 U/L - - -  AST 10 - 35 U/L 19 - -  ALT 6 - 29 U/L 15 - -    Hepatic Function Latest Ref Rng & Units 02/26/2020 07/23/2019 10/28/2017  Total Protein 6.1 - 8.1 g/dL 7.0 7.9 7.7  Albumin 3.5 - 5.0 g/dL - 3.9 -  AST 10 - 35 U/L 19 32 18  ALT 6 - 29 U/L 15 53(H) 20  Alk Phosphatase 38 - 126 U/L - 107 -  Total Bilirubin 0.2 - 1.2 mg/dL 0.4 0.6 0.3  Bilirubin, Direct <=0.2 mg/dL - - -     Assessment:  #1.  Nausea and postprandial periumbilical pain.  She also has been losing weight which is very worrisome.  She is on multiple supplements which may be contributing to some of her symptoms.  I would recommend mostly should be discontinued at least temporarily.  Differential diagnosis includes  peptic ulcer disease or even neoplasm given her weight loss.  Since she is also experiencing dysphagia it would be reasonable to proceed with esophagogastroduodenoscopy before considering CT and order abdominal ultrasound.  #2.  Esophageal dysphagia.  It remains to be seen of dysphagia as because she is not able to chew her food effectively or she could also have ring or a stricture.  Would consider esophageal dilation at the time of EGD unless there is contraindication to do so.  #3.  History of constipation.  She appears to be doing well  with stool softener and dietary measures.  Patient advised not to take OTC preparation containing senna as active ingredient.  #4.  Patient is average risk for CRC.  Screening colonoscopy would be delayed until acute symptomatology has been addressed.  Plan:  Patient advised to decrease turmeric dose to 500 mg twice daily. She will continue Colace, probiotic and vitamin D as before but for the time being discontinue all the other supplements. Begin pantoprazole 40 mg by mouth 30 minutes before breakfast daily. Patient will go to the lab for hemoglobin A1c and TSH. Esophagogastroduodenoscopy with possible esophageal dilation in near future.

## 2020-03-08 ENCOUNTER — Other Ambulatory Visit (INDEPENDENT_AMBULATORY_CARE_PROVIDER_SITE_OTHER): Payer: Self-pay | Admitting: *Deleted

## 2020-03-08 ENCOUNTER — Encounter (INDEPENDENT_AMBULATORY_CARE_PROVIDER_SITE_OTHER): Payer: Self-pay | Admitting: *Deleted

## 2020-03-08 ENCOUNTER — Other Ambulatory Visit: Payer: Self-pay | Admitting: Family Medicine

## 2020-03-08 ENCOUNTER — Telehealth: Payer: Self-pay

## 2020-03-08 DIAGNOSIS — R634 Abnormal weight loss: Secondary | ICD-10-CM

## 2020-03-08 DIAGNOSIS — E1140 Type 2 diabetes mellitus with diabetic neuropathy, unspecified: Secondary | ICD-10-CM

## 2020-03-08 DIAGNOSIS — R110 Nausea: Secondary | ICD-10-CM

## 2020-03-08 DIAGNOSIS — R109 Unspecified abdominal pain: Secondary | ICD-10-CM

## 2020-03-08 LAB — HEMOGLOBIN A1C
Hgb A1c MFr Bld: 7.5 % of total Hgb — ABNORMAL HIGH (ref <5.7)
Mean Plasma Glucose: 169 (calc)
eAG (mmol/L): 9.3 (calc)

## 2020-03-08 LAB — TSH: TSH: 1.25 mIU/L (ref 0.40–4.50)

## 2020-03-08 MED ORDER — GLIPIZIDE ER 10 MG PO TB24: 20.0000 mg | ORAL_TABLET | Freq: Every day | ORAL | 2 refills | Status: DC

## 2020-03-08 NOTE — Progress Notes (Signed)
Noted, I have increased her glipizide to 20mg  once daily.

## 2020-03-08 NOTE — Telephone Encounter (Signed)
-----   Message from Perlie Mayo, NP sent at 03/08/2020  8:50 AM EDT ----- TSH normal A1C has improved greatly to 7.5%.  Please ask if she is taking all meds as directed or if she is just not eating well. Reminder her of upcoming appt.

## 2020-03-10 DIAGNOSIS — E871 Hypo-osmolality and hyponatremia: Secondary | ICD-10-CM | POA: Diagnosis not present

## 2020-03-10 DIAGNOSIS — R296 Repeated falls: Secondary | ICD-10-CM | POA: Diagnosis not present

## 2020-03-10 DIAGNOSIS — D696 Thrombocytopenia, unspecified: Secondary | ICD-10-CM | POA: Diagnosis not present

## 2020-03-10 DIAGNOSIS — A419 Sepsis, unspecified organism: Secondary | ICD-10-CM | POA: Diagnosis not present

## 2020-03-10 DIAGNOSIS — N390 Urinary tract infection, site not specified: Secondary | ICD-10-CM | POA: Diagnosis not present

## 2020-03-10 DIAGNOSIS — E1165 Type 2 diabetes mellitus with hyperglycemia: Secondary | ICD-10-CM | POA: Diagnosis not present

## 2020-03-10 DIAGNOSIS — Z7984 Long term (current) use of oral hypoglycemic drugs: Secondary | ICD-10-CM | POA: Diagnosis not present

## 2020-03-10 DIAGNOSIS — B964 Proteus (mirabilis) (morganii) as the cause of diseases classified elsewhere: Secondary | ICD-10-CM | POA: Diagnosis not present

## 2020-03-10 DIAGNOSIS — E785 Hyperlipidemia, unspecified: Secondary | ICD-10-CM | POA: Diagnosis not present

## 2020-03-10 DIAGNOSIS — I10 Essential (primary) hypertension: Secondary | ICD-10-CM | POA: Diagnosis not present

## 2020-03-10 DIAGNOSIS — Z9181 History of falling: Secondary | ICD-10-CM | POA: Diagnosis not present

## 2020-03-10 DIAGNOSIS — K5909 Other constipation: Secondary | ICD-10-CM | POA: Diagnosis not present

## 2020-03-14 ENCOUNTER — Ambulatory Visit: Payer: Self-pay

## 2020-03-14 ENCOUNTER — Other Ambulatory Visit (INDEPENDENT_AMBULATORY_CARE_PROVIDER_SITE_OTHER): Payer: Self-pay | Admitting: *Deleted

## 2020-03-15 ENCOUNTER — Encounter: Payer: Self-pay | Admitting: Family Medicine

## 2020-03-16 DIAGNOSIS — Z9181 History of falling: Secondary | ICD-10-CM | POA: Diagnosis not present

## 2020-03-16 DIAGNOSIS — N390 Urinary tract infection, site not specified: Secondary | ICD-10-CM | POA: Diagnosis not present

## 2020-03-16 DIAGNOSIS — B964 Proteus (mirabilis) (morganii) as the cause of diseases classified elsewhere: Secondary | ICD-10-CM | POA: Diagnosis not present

## 2020-03-16 DIAGNOSIS — Z7984 Long term (current) use of oral hypoglycemic drugs: Secondary | ICD-10-CM | POA: Diagnosis not present

## 2020-03-16 DIAGNOSIS — K5909 Other constipation: Secondary | ICD-10-CM | POA: Diagnosis not present

## 2020-03-16 DIAGNOSIS — D696 Thrombocytopenia, unspecified: Secondary | ICD-10-CM | POA: Diagnosis not present

## 2020-03-16 DIAGNOSIS — R296 Repeated falls: Secondary | ICD-10-CM | POA: Diagnosis not present

## 2020-03-16 DIAGNOSIS — E1165 Type 2 diabetes mellitus with hyperglycemia: Secondary | ICD-10-CM | POA: Diagnosis not present

## 2020-03-16 DIAGNOSIS — A419 Sepsis, unspecified organism: Secondary | ICD-10-CM | POA: Diagnosis not present

## 2020-03-16 DIAGNOSIS — E871 Hypo-osmolality and hyponatremia: Secondary | ICD-10-CM | POA: Diagnosis not present

## 2020-03-16 DIAGNOSIS — E785 Hyperlipidemia, unspecified: Secondary | ICD-10-CM | POA: Diagnosis not present

## 2020-03-16 DIAGNOSIS — I10 Essential (primary) hypertension: Secondary | ICD-10-CM | POA: Diagnosis not present

## 2020-03-21 ENCOUNTER — Encounter: Payer: Medicare Other | Attending: "Endocrinology | Admitting: Nutrition

## 2020-03-21 ENCOUNTER — Other Ambulatory Visit (HOSPITAL_COMMUNITY)
Admission: RE | Admit: 2020-03-21 | Discharge: 2020-03-21 | Disposition: A | Discharge status: Home / Self Care | Payer: Medicare Other | Source: Ambulatory Visit | Attending: Internal Medicine | Admitting: Internal Medicine

## 2020-03-21 ENCOUNTER — Other Ambulatory Visit: Payer: Self-pay

## 2020-03-21 ENCOUNTER — Encounter: Payer: Self-pay | Admitting: Nutrition

## 2020-03-21 ENCOUNTER — Encounter (HOSPITAL_COMMUNITY)
Admission: RE | Admit: 2020-03-21 | Discharge: 2020-03-21 | Disposition: A | Discharge status: Home / Self Care | Payer: Medicare Other | Source: Ambulatory Visit | Attending: Internal Medicine | Admitting: Internal Medicine

## 2020-03-21 DIAGNOSIS — E782 Mixed hyperlipidemia: Secondary | ICD-10-CM

## 2020-03-21 DIAGNOSIS — Z20822 Contact with and (suspected) exposure to covid-19: Secondary | ICD-10-CM | POA: Diagnosis not present

## 2020-03-21 DIAGNOSIS — Z01812 Encounter for preprocedural laboratory examination: Secondary | ICD-10-CM | POA: Diagnosis not present

## 2020-03-21 DIAGNOSIS — E1165 Type 2 diabetes mellitus with hyperglycemia: Secondary | ICD-10-CM | POA: Insufficient documentation

## 2020-03-21 LAB — SARS CORONAVIRUS 2 (TAT 6-24 HRS): SARS Coronavirus 2: NEGATIVE

## 2020-03-21 NOTE — Patient Instructions (Addendum)
  Goals Eat three meals a day on time Do not skip meals Drink Glucerna with lunch or dinner Increase fresh fruits and vegetables. Drink 4 bottles of water per day Talk to MD about if you need to continue iron pills  Gain 2 lbs per month. Goal wt is 150 lbs.

## 2020-03-21 NOTE — Progress Notes (Signed)
Medical Nutrition Therapy:  Appt start time: 1330 end time:  1430.   Assessment:  Primary concerns today: DM Type 2. Her granddaughter lives with her. A1C  7.5%. A1C has been as high as high as 15%. Refused insulin. Sees Dr. Dorris Fetch.Missed follow up appt. Will reschedule. She does the cooking. Eats 2 meals a day. Appetite is getting better. Is drinking Glucerna or Boost for DM.Marland Kitchen Drinking water. Currently taking 5 mg of Glipizide a day due to higher doses were causing low blood sugars. Testing in am and sometimes in pm. Lost 25 lbs in the past year.  In 2017 she was 217 lbs. Has been losing weight for the last few years. Has chronic constipation. Getting EGD in 2 days. Was in the hospital due to constipation, low potassium and iron. Testing twice a day. No longer taking Metformin due to stomach issues. Takes Mylanta due to gas and stomach issues. FBS 121-154 lmg/dl. Willing to work on eating more consistent meals. Takes quite a few over the counter supplements. Walks with a cane.   Lab Results  Component Value Date   HGBA1C 7.5 (H) 03/07/2020   . CMP Latest Ref Rng & Units 02/26/2020 12/28/2019 07/23/2019  Glucose 65 - 99 mg/dL 190(H) 290(H) 971(HH)  BUN 7 - 25 mg/dL 15 10 20   Creatinine 0.50 - 0.99 mg/dL 0.45(L) 0.66 0.82  Sodium 135 - 146 mmol/L 137 125(L) 123(L)  Potassium 3.5 - 5.3 mmol/L 4.0 3.3(L) 4.7  Chloride 98 - 110 mmol/L 100 90(L) 87(L)  CO2 20 - 32 mmol/L 26 24 22   Calcium 8.6 - 10.4 mg/dL 9.6 9.3 8.9  Total Protein 6.1 - 8.1 g/dL 7.0 - -  Total Bilirubin 0.2 - 1.2 mg/dL 0.4 - -  Alkaline Phos 38 - 126 U/L - - -  AST 10 - 35 U/L 19 - -  ALT 6 - 29 U/L 15 - -   CBC    Component Value Date/Time   WBC 9.3 02/26/2020 1040   RBC 4.66 02/26/2020 1040   HGB 12.7 02/26/2020 1040   HCT 39.3 02/26/2020 1040   PLT 193 02/26/2020 1040   MCV 84.3 02/26/2020 1040   MCH 27.3 02/26/2020 1040   MCHC 32.3 02/26/2020 1040   RDW 13.8 02/26/2020 1040   LYMPHSABS 3.3 04/14/2015 1535    MONOABS 0.8 04/14/2015 1535   EOSABS 0.5 04/14/2015 1535   BASOSABS 0.1 04/14/2015 1535    Preferred Learning Style:  No preference indicated   Learning Readiness:  Ready  Change in progress   MEDICATIONS: see list   DIETARY INTAKE:  24-hr recall:  B ( AM):  Eggs and cheese, bacon, applesauce and coffee Snk ( AM):   L ( PM): roast, squash, onions, rice pudding, turnip greens, water Snk ( PM):  D ( PM): Protein drink, , cheese crackers, nuts, water Snk ( PM):  Beverages: water  Usual physical activity: ADL  Estimated energy needs: 1500-1800  calories 170 g carbohydrates 112 g protein 42 g fat  Progress Towards Goal(s):  In progress.   Nutritional Diagnosis:  NB-1.1 Food and nutrition-related knowledge deficit As related to Diabetes Type 2.  As evidenced by A1C 7.5%. It had been as high as 15% per patient..    Intervention:  Nutrition and Diabetes education provided on My Plate, CHO counting, meal planning, portion sizes, timing of meals, avoiding snacks between meals unless having a low blood sugar, target ranges for A1C and blood sugars, signs/symptoms and treatment of hyper/hypoglycemia, monitoring blood  sugars, taking medications as prescribed, benefits of exercising 30 minutes per day and prevention of complications of DM. Marland KitchenGoals Eat three meals a day on time Do not skip meals Drink Glucerna with lunch or dinner Increase fresh fruits and vegetables. Drink 4 bottles of water per day Talk to MD about if you need to continue iron pills  Gain 2 lbs per month. Goal wt is 150 lbs.  Teaching Method Utilized:  Visual Auditory Hands on  Handouts given during visit include:  The Plate Method   Diabetes Instructions   Barriers to learning/adherence to lifestyle change: none  Demonstrated degree of understanding via:  Teach Back   Monitoring/Evaluation:  Dietary intake, exercise, and body weight in 1 month(s).

## 2020-03-22 DIAGNOSIS — K5909 Other constipation: Secondary | ICD-10-CM | POA: Diagnosis not present

## 2020-03-22 DIAGNOSIS — Z7984 Long term (current) use of oral hypoglycemic drugs: Secondary | ICD-10-CM | POA: Diagnosis not present

## 2020-03-22 DIAGNOSIS — D696 Thrombocytopenia, unspecified: Secondary | ICD-10-CM | POA: Diagnosis not present

## 2020-03-22 DIAGNOSIS — E871 Hypo-osmolality and hyponatremia: Secondary | ICD-10-CM | POA: Diagnosis not present

## 2020-03-22 DIAGNOSIS — A419 Sepsis, unspecified organism: Secondary | ICD-10-CM | POA: Diagnosis not present

## 2020-03-22 DIAGNOSIS — Z9181 History of falling: Secondary | ICD-10-CM | POA: Diagnosis not present

## 2020-03-22 DIAGNOSIS — R296 Repeated falls: Secondary | ICD-10-CM | POA: Diagnosis not present

## 2020-03-22 DIAGNOSIS — I10 Essential (primary) hypertension: Secondary | ICD-10-CM | POA: Diagnosis not present

## 2020-03-22 DIAGNOSIS — E1165 Type 2 diabetes mellitus with hyperglycemia: Secondary | ICD-10-CM | POA: Diagnosis not present

## 2020-03-22 DIAGNOSIS — E785 Hyperlipidemia, unspecified: Secondary | ICD-10-CM | POA: Diagnosis not present

## 2020-03-22 DIAGNOSIS — B964 Proteus (mirabilis) (morganii) as the cause of diseases classified elsewhere: Secondary | ICD-10-CM | POA: Diagnosis not present

## 2020-03-22 DIAGNOSIS — N390 Urinary tract infection, site not specified: Secondary | ICD-10-CM | POA: Diagnosis not present

## 2020-03-23 ENCOUNTER — Encounter (HOSPITAL_COMMUNITY)
Admission: RE | Disposition: A | Discharge status: Home / Self Care | Payer: Self-pay | Source: Home / Self Care | Attending: Internal Medicine

## 2020-03-23 ENCOUNTER — Ambulatory Visit (HOSPITAL_COMMUNITY): Payer: Medicare Other | Admitting: Anesthesiology

## 2020-03-23 ENCOUNTER — Ambulatory Visit (HOSPITAL_COMMUNITY)
Admission: RE | Admit: 2020-03-23 | Discharge: 2020-03-23 | Disposition: A | Discharge status: Home / Self Care | Payer: Medicare Other | Attending: Internal Medicine | Admitting: Internal Medicine

## 2020-03-23 ENCOUNTER — Encounter (HOSPITAL_COMMUNITY): Payer: Self-pay | Admitting: Internal Medicine

## 2020-03-23 DIAGNOSIS — Z7984 Long term (current) use of oral hypoglycemic drugs: Secondary | ICD-10-CM | POA: Insufficient documentation

## 2020-03-23 DIAGNOSIS — Z9081 Acquired absence of spleen: Secondary | ICD-10-CM | POA: Insufficient documentation

## 2020-03-23 DIAGNOSIS — E785 Hyperlipidemia, unspecified: Secondary | ICD-10-CM | POA: Diagnosis not present

## 2020-03-23 DIAGNOSIS — R109 Unspecified abdominal pain: Secondary | ICD-10-CM

## 2020-03-23 DIAGNOSIS — K3189 Other diseases of stomach and duodenum: Secondary | ICD-10-CM | POA: Insufficient documentation

## 2020-03-23 DIAGNOSIS — K449 Diaphragmatic hernia without obstruction or gangrene: Secondary | ICD-10-CM | POA: Diagnosis not present

## 2020-03-23 DIAGNOSIS — K2950 Unspecified chronic gastritis without bleeding: Secondary | ICD-10-CM

## 2020-03-23 DIAGNOSIS — Z885 Allergy status to narcotic agent status: Secondary | ICD-10-CM | POA: Diagnosis not present

## 2020-03-23 DIAGNOSIS — Z8719 Personal history of other diseases of the digestive system: Secondary | ICD-10-CM | POA: Diagnosis not present

## 2020-03-23 DIAGNOSIS — Z7982 Long term (current) use of aspirin: Secondary | ICD-10-CM | POA: Insufficient documentation

## 2020-03-23 DIAGNOSIS — R634 Abnormal weight loss: Secondary | ICD-10-CM

## 2020-03-23 DIAGNOSIS — R1314 Dysphagia, pharyngoesophageal phase: Secondary | ICD-10-CM | POA: Diagnosis not present

## 2020-03-23 DIAGNOSIS — R110 Nausea: Secondary | ICD-10-CM

## 2020-03-23 DIAGNOSIS — Z79899 Other long term (current) drug therapy: Secondary | ICD-10-CM | POA: Diagnosis not present

## 2020-03-23 DIAGNOSIS — Z833 Family history of diabetes mellitus: Secondary | ICD-10-CM | POA: Insufficient documentation

## 2020-03-23 DIAGNOSIS — R1033 Periumbilical pain: Secondary | ICD-10-CM | POA: Insufficient documentation

## 2020-03-23 DIAGNOSIS — E119 Type 2 diabetes mellitus without complications: Secondary | ICD-10-CM | POA: Diagnosis not present

## 2020-03-23 DIAGNOSIS — R1013 Epigastric pain: Secondary | ICD-10-CM | POA: Diagnosis not present

## 2020-03-23 HISTORY — PX: BIOPSY: SHX5522

## 2020-03-23 HISTORY — PX: ESOPHAGOGASTRODUODENOSCOPY (EGD) WITH PROPOFOL: SHX5813

## 2020-03-23 HISTORY — PX: MALONEY DILATION: SHX5535

## 2020-03-23 LAB — GLUCOSE, CAPILLARY: Glucose-Capillary: 156 mg/dL — ABNORMAL HIGH (ref 70–99)

## 2020-03-23 SURGERY — ESOPHAGOGASTRODUODENOSCOPY (EGD) WITH PROPOFOL
Anesthesia: General

## 2020-03-23 MED ORDER — PROPOFOL 10 MG/ML IV BOLUS
INTRAVENOUS | Status: AC
  Filled 2020-03-23: qty 20

## 2020-03-23 MED ORDER — LACTATED RINGERS IV SOLN: INTRAVENOUS | Status: DC | PRN

## 2020-03-23 MED ORDER — PHENYLEPHRINE HCL (PRESSORS) 10 MG/ML IV SOLN
INTRAVENOUS | Status: DC | PRN
  Administered 2020-03-23: 100 ug via INTRAVENOUS
  Administered 2020-03-23: 200 ug via INTRAVENOUS

## 2020-03-23 MED ORDER — CHLORHEXIDINE GLUCONATE CLOTH 2 % EX PADS: 6.0000 | MEDICATED_PAD | Freq: Once | CUTANEOUS | Status: DC

## 2020-03-23 MED ORDER — MIDAZOLAM HCL 5 MG/5ML IJ SOLN
INTRAMUSCULAR | Status: DC | PRN
  Administered 2020-03-23: 2 mg via INTRAVENOUS

## 2020-03-23 MED ORDER — PHENYLEPHRINE 40 MCG/ML (10ML) SYRINGE FOR IV PUSH (FOR BLOOD PRESSURE SUPPORT)
PREFILLED_SYRINGE | INTRAVENOUS | Status: AC
  Filled 2020-03-23: qty 10

## 2020-03-23 MED ORDER — MIDAZOLAM HCL 2 MG/2ML IJ SOLN
INTRAMUSCULAR | Status: AC
  Filled 2020-03-23: qty 2

## 2020-03-23 MED ORDER — PROPOFOL 500 MG/50ML IV EMUL
INTRAVENOUS | Status: DC | PRN
  Administered 2020-03-23: 150 ug/kg/min via INTRAVENOUS

## 2020-03-23 MED ORDER — LACTATED RINGERS IV SOLN: INTRAVENOUS | Status: DC

## 2020-03-23 NOTE — Anesthesia Postprocedure Evaluation (Signed)
Anesthesia Post Note  Patient: [name]  Procedure(s) Performed: ESOPHAGOGASTRODUODENOSCOPY (EGD) WITH PROPOFOL (N/A ) [place]  Patient location during evaluation: PACU Anesthesia Type: General Level of consciousness: awake, oriented, awake and alert and patient cooperative Pain management: pain level controlled Vital Signs Assessment: post-procedure vital signs reviewed and stable Respiratory status: spontaneous breathing, respiratory function stable and nonlabored ventilation Cardiovascular status: stable Postop Assessment: no apparent nausea or vomiting Anesthetic complications: no     Last Vitals: There were no vitals filed for this visit.  Last Pain:  Vitals:   03/23/20 1257  PainSc: 3                  [name]

## 2020-03-23 NOTE — Anesthesia Preprocedure Evaluation (Signed)
Anesthesia Evaluation  Patient identified by MRN, date of birth, ID band Patient awake    Reviewed: Allergy & Precautions, H&P , NPO status , Patient's Chart, lab work & pertinent test results, reviewed documented beta blocker date and time   Airway Mallampati: II  TM Distance: >3 FB Neck ROM: full    Dental no notable dental hx.    Pulmonary neg pulmonary ROS,    Pulmonary exam normal breath sounds clear to auscultation       Cardiovascular Exercise Tolerance: Good hypertension, negative cardio ROS   Rhythm:regular Rate:Normal     Neuro/Psych negative neurological ROS  negative psych ROS   GI/Hepatic Neg liver ROS, GERD  Medicated,  Endo/Other  negative endocrine ROSdiabetes  Renal/GU negative Renal ROS  negative genitourinary   Musculoskeletal   Abdominal   Peds  Hematology  (+) Blood dyscrasia, anemia ,   Anesthesia Other Findings   Reproductive/Obstetrics negative OB ROS                             Anesthesia Physical Anesthesia Plan  ASA: II  Anesthesia Plan: General   Post-op Pain Management:    Induction:   PONV Risk Score and Plan: 3 and Propofol infusion  Airway Management Planned:   Additional Equipment:   Intra-op Plan:   Post-operative Plan:   Informed Consent: I have reviewed the patients History and Physical, chart, labs and discussed the procedure including the risks, benefits and alternatives for the proposed anesthesia with the patient or authorized representative who has indicated his/her understanding and acceptance.     Dental Advisory Given  Plan Discussed with: CRNA  Anesthesia Plan Comments:         Anesthesia Quick Evaluation

## 2020-03-23 NOTE — Interval H&P Note (Signed)
Patient reports having less abdominal pain since she has changed her diet.  She is still having swallowing difficulty. No other change in her history. His ambulation is pertinent for mild epigastric and periumbilical tenderness.  No organomegaly or masses. Patient is agreeable to proceed with esophagogastroduodenoscopy with esophageal dilation under monitored anesthesia care.  History and Physical Interval Note:  03/23/2020 12:51 PM  [name]  has presented today for surgery, with the diagnosis of nausea, weight loss, abdominal pain.  The various methods of treatment have been discussed with the patient and family. After consideration of risks, benefits and other options for treatment, the patient has consented to  Procedure(s) with comments: ESOPHAGOGASTRODUODENOSCOPY (EGD) WITH PROPOFOL (N/A) - 125 as a surgical intervention.  The patient's history has been reviewed, patient examined, no change in status, stable for surgery.  I have reviewed the patient's chart and labs.  Questions were answered to the patient's satisfaction.     [employer]

## 2020-03-23 NOTE — Discharge Instructions (Signed)
No aspirin or NSAIDs for 24 hours. Resume usual medications as before. Resume usual diet. No driving for 24 hours. Physician will call with biopsy results.

## 2020-03-23 NOTE — Op Note (Signed)
[facility] Patient Name: [name] Procedure Date: 03/23/2020 12:43 PM MRN: [id number] Date of Birth: 04/16/1956 Attending MD: Hildred Laser , MD CSN: [id number] Age: 64 Admit Type: Outpatient Procedure:                Upper GI endoscopy Indications:              Epigastric abdominal pain, Periumbilical abdominal                            pain, Esophageal dysphagia, Weight loss Providers:                Hildred Laser, MD, Otis Peak B. Sharon Seller, RN, Nelma Rothman, Technician Referring MD:             Norwood Levo. Moshe Cipro, MD Medicines:                Propofol per Anesthesia Complications:            No immediate complications. Estimated Blood Loss:     Estimated blood loss was minimal. Procedure:                Pre-Anesthesia Assessment:                           - Prior to the procedure, a History and Physical                            was performed, and patient medications and                            allergies were reviewed. The patient's tolerance of                            previous anesthesia was also reviewed. The risks                            and benefits of the procedure and the sedation                            options and risks were discussed with the patient.                            All questions were answered, and informed consent                            was obtained. Prior Anticoagulants: The patient has                            taken no previous anticoagulant or antiplatelet                            agents. ASA Grade Assessment: II - A patient with  mild systemic disease. After reviewing the risks                            and benefits, the patient was deemed in                            satisfactory condition to undergo the procedure.                           After obtaining informed consent, the endoscope was                            passed under direct vision. Throughout the                procedure, the patient's blood pressure, pulse, and                            oxygen saturations were monitored continuously. The                            [id number]-[id number] scope was introduced through the                            mouth, and advanced to the second part of duodenum.                            The upper GI endoscopy was accomplished without                            difficulty. The patient tolerated the procedure                            well. Scope In: 1:00:44 PM Scope Out: 1:12:35 PM Total Procedure Duration: 0 hours 11 minutes 51 seconds  Findings:      The hypopharynx was normal.      The examined esophagus was normal.      The Z-line was regular and was found 36 cm from the incisors.      A 2 cm hiatal hernia was present.      No endoscopic abnormality was evident in the esophagus to explain the       patient's complaint of dysphagia. It was decided, however, to proceed       with dilation of the entire esophagus. The scope was withdrawn. Dilation       was performed with a Maloney dilator with no resistance at 49 Fr. The       dilation site was examined following endoscope reinsertion and showed no       change and no bleeding, mucosal tear or perforation.      Patchy mildly erythematous mucosa without bleeding was found in the       gastric antrum and in the prepyloric region of the stomach. Biopsies       were taken with a cold forceps for histology.      The exam of the stomach was otherwise normal.      The duodenal bulb and second portion of the duodenum were normal.  Impression:               - Normal hypopharynx.                           - Normal esophagus.                           - Z-line regular, 36 cm from the incisors.                           - 2 cm hiatal hernia.                           - No endoscopic esophageal abnormality to explain                            patient's dysphagia. Esophagus dilated. Dilated.                            - Erythematous mucosa in the antrum and prepyloric                            region of the stomach. Biopsied.                           - Normal duodenal bulb and second portion of the                            duodenum. Moderate Sedation:      Per Anesthesia Care Recommendation:           - Patient has a contact number available for                            emergencies. The signs and symptoms of potential                            delayed complications were discussed with the                            patient. Return to normal activities tomorrow.                            Written discharge instructions were provided to the                            patient.                           - Resume previous diet today.                           - Continue present medications.                           - No aspirin, ibuprofen, naproxen, or other  non-steroidal anti-inflammatory drugs for 1 day.                           - Await pathology results. Procedure Code(s):        --- Professional ---                           [phone], Esophagogastroduodenoscopy, flexible,                            transoral; with biopsy, single or multiple                           43450, Dilation of esophagus, by unguided sound or                            bougie, single or multiple passes Diagnosis Code(s):        --- Professional ---                           K44.9, Diaphragmatic hernia without obstruction or                            gangrene                           K31.89, Other diseases of stomach and duodenum                           R10.13, Epigastric pain                           A999333, Periumbilical pain                           R13.14, Dysphagia, pharyngoesophageal phase                           R63.4, Abnormal weight loss CPT copyright 2019 American Medical Association. All rights reserved. The codes documented in this report are preliminary and upon  coder review may  be revised to meet current compliance requirements. Hildred Laser, MD Hildred Laser, MD 03/23/2020 1:20:42 PM This report has been signed electronically. Number of Addenda: 0

## 2020-03-23 NOTE — Transfer of Care (Signed)
Immediate Anesthesia Transfer of Care Note  Patient: [name]  Procedure(s) Performed: ESOPHAGOGASTRODUODENOSCOPY (EGD) WITH PROPOFOL (N/A ) MALONEY DILATION BIOPSY  Patient Location: PACU  Anesthesia Type:General  Level of Consciousness: awake, alert , oriented and patient cooperative  Airway & Oxygen Therapy: Patient Spontanous Breathing  Post-op Assessment: Report given to RN, Post -op Vital signs reviewed and stable and Patient moving all extremities X 4  Post vital signs: Reviewed and stable  Last Vitals:  Vitals Value Taken Time  BP 82/58 03/23/20 1319  Temp    Pulse 102 03/23/20 1320  Resp 26 03/23/20 1320  SpO2 96 % 03/23/20 1320  Vitals shown include unvalidated device data.  Last Pain:  Vitals:   03/23/20 1257  PainSc: 3          Complications: No apparent anesthesia complications

## 2020-03-25 ENCOUNTER — Other Ambulatory Visit: Payer: Self-pay

## 2020-03-25 LAB — SURGICAL PATHOLOGY

## 2020-03-30 ENCOUNTER — Other Ambulatory Visit: Payer: Self-pay

## 2020-03-30 ENCOUNTER — Ambulatory Visit (INDEPENDENT_AMBULATORY_CARE_PROVIDER_SITE_OTHER): Payer: Medicare Other | Admitting: Orthopedic Surgery

## 2020-03-30 ENCOUNTER — Encounter: Payer: Self-pay | Admitting: Orthopedic Surgery

## 2020-03-30 ENCOUNTER — Ambulatory Visit: Payer: Medicare Other

## 2020-03-30 VITALS — BP 132/81 | HR 93 | Ht 63.0 in | Wt 120.0 lb

## 2020-03-30 DIAGNOSIS — M778 Other enthesopathies, not elsewhere classified: Secondary | ICD-10-CM

## 2020-03-30 DIAGNOSIS — M542 Cervicalgia: Secondary | ICD-10-CM

## 2020-03-30 DIAGNOSIS — R202 Paresthesia of skin: Secondary | ICD-10-CM

## 2020-03-30 MED ORDER — MELOXICAM 7.5 MG PO TABS: 7.5000 mg | ORAL_TABLET | Freq: Every day | ORAL | 5 refills | Status: DC

## 2020-03-30 MED ORDER — CYCLOBENZAPRINE HCL 5 MG PO TABS: 5.0000 mg | ORAL_TABLET | Freq: Three times a day (TID) | ORAL | 0 refills | Status: DC | PRN

## 2020-03-30 NOTE — Patient Instructions (Signed)
Meds ordered this encounter  Medications  . cyclobenzaprine (FLEXERIL) 5 MG tablet    Sig: Take 1 tablet (5 mg total) by mouth 3 (three) times daily as needed for muscle spasms.    Dispense:  30 tablet    Refill:  0  . meloxicam (MOBIC) 7.5 MG tablet    Sig: Take 1 tablet (7.5 mg total) by mouth daily.    Dispense:  30 tablet    Refill:  5   Take cyclobenzoprine for neck and meloxicam for you shoulder

## 2020-03-30 NOTE — Progress Notes (Signed)
Chief Complaint  Patient presents with  . Shoulder Pain    right since 2014 now getting worse     64 year old female seen in 2018 and treated for adhesive capsulitis right shoulder which resolved without surgery  Presents again with right shoulder painand neck pain  Her shoulder pain seems to be located around the deltoid and her neck pain is in the cervical spine and radiates down the spine  She does report a significant 100 pound weight loss but says that was on purpose  Review of Systems  Constitutional: Negative for chills and fever.  Neurological: Negative for tingling and sensory change.   Past Medical History:  Diagnosis Date  . Allergic rhinitis   . ALLERGIC RHINITIS, SEASONAL 02/27/2008   Qualifier: Diagnosis of  By: Rebecca Eaton LPN, [place] Cerise    . Anemia, deficiency   . Annual physical exam 08/15/2015  . Diabetes mellitus   . Educated about COVID-19 virus infection 05/01/2019  . GERD 02/27/2008   Qualifier: Diagnosis of  By: Rebecca Eaton LPN, [place] Cerise    . GERD (gastroesophageal reflux disease)   . Hyperlipidemia   . Hypertension   . Obesity   . Post-menopausal bleeding 08/15/2015    BP 132/81   Pulse 93   Ht 5\' 3"  (1.6 m)   Wt 120 lb (54.4 kg)   BMI 21.26 kg/m   Physical Exam Constitutional:      General: She is not in acute distress.    Appearance: Normal appearance. She is normal weight.  Neurological:     General: No focal deficit present.     Mental Status: She is alert and oriented to person, place, and time.     Cranial Nerves: No cranial nerve deficit.     Sensory: No sensory deficit.     Motor: No weakness.     Coordination: Coordination normal.     Gait: Gait normal.     Deep Tendon Reflexes: Reflexes normal.  Psychiatric:        Mood and Affect: Mood normal.        Behavior: Behavior normal.        Thought Content: Thought content normal.        Judgment: Judgment normal.    Neck is tender appears to have a slight at the base mild crepitance on range of  motion but no loss of motion right side is tender left side is not the right side is tight the left side is not  Right shoulder exam She has no restrictions to passive range of motion she does have pain on active forward elevation with a positive impingement sign  There is no instability in the right shoulder or the left shoulder  Encounter Diagnoses  Name Primary?  . Neck pain Yes  . Right hand paresthesia   . Tendinitis of right shoulder      Meds ordered this encounter  Medications  . cyclobenzaprine (FLEXERIL) 5 MG tablet    Sig: Take 1 tablet (5 mg total) by mouth 3 (three) times daily as needed for muscle spasms.    Dispense:  30 tablet    Refill:  0  . meloxicam (MOBIC) 7.5 MG tablet    Sig: Take 1 tablet (7.5 mg total) by mouth daily.    Dispense:  30 tablet    Refill:  5     Procedure note the subacromial injection shoulder RIGHT    Verbal consent was obtained to inject the  RIGHT   Shoulder  Timeout was completed  to confirm the injection site is a subacromial space of the  RIGHT  shoulder   Medication used Depo-Medrol 40 mg and lidocaine 1% 3 cc  Anesthesia was provided by ethyl chloride  The injection was performed in the RIGHT  posterior subacromial space. After pinning the skin with alcohol and anesthetized the skin with ethyl chloride the subacromial space was injected using a 20-gauge needle. There were no complications  Sterile dressing was applied.

## 2020-04-14 ENCOUNTER — Other Ambulatory Visit: Payer: Self-pay

## 2020-04-14 ENCOUNTER — Encounter: Payer: Self-pay | Admitting: "Endocrinology

## 2020-04-14 ENCOUNTER — Ambulatory Visit (INDEPENDENT_AMBULATORY_CARE_PROVIDER_SITE_OTHER): Payer: Medicare Other | Admitting: "Endocrinology

## 2020-04-14 ENCOUNTER — Encounter: Payer: Medicare Other | Attending: "Endocrinology | Admitting: Nutrition

## 2020-04-14 VITALS — BP 132/84 | HR 106 | Ht 63.0 in | Wt 124.4 lb

## 2020-04-14 DIAGNOSIS — E1165 Type 2 diabetes mellitus with hyperglycemia: Secondary | ICD-10-CM

## 2020-04-14 DIAGNOSIS — E782 Mixed hyperlipidemia: Secondary | ICD-10-CM | POA: Insufficient documentation

## 2020-04-14 MED ORDER — ROSUVASTATIN CALCIUM 5 MG PO TABS: 5.0000 mg | ORAL_TABLET | Freq: Every day | ORAL | 2 refills | Status: DC

## 2020-04-14 NOTE — Patient Instructions (Signed)

## 2020-04-14 NOTE — Patient Instructions (Signed)
.  Goals Eat three meals a day on time Do not skip meals Drink Glucerna with lunch or dinner Increase fresh fruits and vegetables. Drink 4 bottles of water per day Talk to MD about if you need to continue iron pills  Gain 2 lbs per month. Goal wt is 150 lbs.

## 2020-04-14 NOTE — Progress Notes (Signed)
04/14/2020, 6:16 PM   Endocrinology follow-up note    Subjective:    Patient ID: [name], female    DOB: June 19, 1956.  [name] is here for foot exam, and  for follow-up after she was seen in consultation for management of currently uncontrolled symptomatic diabetes requested by  [name], MD.   Past Medical History:  Diagnosis Date  . Allergic rhinitis   . ALLERGIC RHINITIS, SEASONAL 02/27/2008   Qualifier: Diagnosis of  By: Rebecca Eaton LPN, [place] Cerise    . Anemia, deficiency   . Annual physical exam 08/15/2015  . Diabetes mellitus   . Educated about COVID-19 virus infection 05/01/2019  . GERD 02/27/2008   Qualifier: Diagnosis of  By: Rebecca Eaton LPN, [place] Cerise    . GERD (gastroesophageal reflux disease)   . Hyperlipidemia   . Hypertension   . Obesity   . Post-menopausal bleeding 08/15/2015    Past Surgical History:  Procedure Laterality Date  . BIOPSY  03/23/2020   Procedure: BIOPSY;  Surgeon: Rogene Houston, MD;  Location: [facility];  Service: Endoscopy;;  gastric  . ESOPHAGOGASTRODUODENOSCOPY (EGD) WITH PROPOFOL N/A 03/23/2020   Procedure: ESOPHAGOGASTRODUODENOSCOPY (EGD) WITH PROPOFOL;  Surgeon: Rogene Houston, MD;  Location: [facility];  Service: Endoscopy;  Laterality: N/A;  125  . EXTRACTION OF SEVENTH TEETH  2007  . MALONEY DILATION  03/23/2020   Procedure: MALONEY DILATION;  Surgeon: Rogene Houston, MD;  Location: [facility];  Service: Endoscopy;;  . REMOVAL OF GREAT TOENAIL  2005  . SPLENECTOMY  2002    Social History   Socioeconomic History  . Marital status: Divorced    Spouse name: Not on file  . Number of children: 1  . Years of education: Not on file  . Highest education level: Not on file  Occupational History  . Occupation: DISABLED   Tobacco Use  . Smoking status: Never Smoker  . Smokeless tobacco: Never Used  Substance and Sexual Activity  .  Alcohol use: No  . Drug use: No  . Sexual activity: Yes    Birth control/protection: Post-menopausal  Other Topics Concern  . Not on file  Social History Narrative  . Not on file   Social Determinants of Health   Financial Resource Strain:   . Difficulty of Paying Living Expenses:   Food Insecurity:   . Worried About Charity fundraiser in the Last Year:   . Arboriculturist in the Last Year:   Transportation Needs:   . Film/video editor (Medical):   Marland Kitchen Lack of Transportation (Non-Medical):   Physical Activity:   . Days of Exercise per Week:   . Minutes of Exercise per Session:   Stress:   . Feeling of Stress :   Social Connections:   . Frequency of Communication with Friends and Family:   . Frequency of Social Gatherings with Friends and Family:   . Attends Religious Services:   . Active Member of Clubs or Organizations:   . Attends Archivist Meetings:   Marland Kitchen Marital Status:     Family History  Problem Relation Age of  Onset  . Diabetes Mother   . Hypertension Mother   . Hyperlipidemia Mother   . Lung disease Father   . Hypertension Sister   . Hyperlipidemia Sister   . Hypertension Brother   . Sickle cell trait Son   . Arthritis Other   . Asthma Other   . Cancer Other     Outpatient Encounter Medications as of 04/14/2020  Medication Sig  . acetaminophen (TYLENOL) 650 MG CR tablet Take 1,300 mg by mouth every 8 (eight) hours as needed for pain.  Marland Kitchen alum & mag hydroxide-simeth (MAALOX/MYLANTA) 200-200-20 MG/5ML suspension Take 30 mLs by mouth every 6 (six) hours as needed for indigestion or heartburn.  . ASPERCREME LIDOCAINE EX Apply 1 application topically daily as needed (pain).  . Cholecalciferol (DIALYVITE VITAMIN D 5000) 125 MCG (5000 UT) capsule Take 5,000 Units by mouth daily.  . COLOSTRUM PO Take 3 tablets by mouth daily before breakfast.  . cyclobenzaprine (FLEXERIL) 5 MG tablet Take 1 tablet (5 mg total) by mouth 3 (three) times daily as needed  for muscle spasms.  Marland Kitchen docusate sodium (COLACE) 100 MG capsule Take 100 mg by mouth at bedtime.  Marland Kitchen ECHINACEA PO Take 760 mg by mouth daily.  . feeding supplement, GLUCERNA SHAKE, (GLUCERNA SHAKE) LIQD Take 237 mLs by mouth daily.  . Ferrous Sulfate (IRON PO) Take 25 mg by mouth daily.  . Garlic 123XX123 MG CAPS Take 1,000 mg by mouth at bedtime.  Marland Kitchen glipiZIDE (GLUCOTROL XL) 5 MG 24 hr tablet Take 5 mg by mouth daily with breakfast.  . glucose blood (ACCU-CHEK GUIDE) test strip USE 1 STRIP TO CHECK GLUCOSE TWICE DAILY  . Lancets MISC 1 each by Does not apply route as directed.  . meloxicam (MOBIC) 7.5 MG tablet Take 1 tablet (7.5 mg total) by mouth daily.  . Multiple Vitamins-Minerals (MULTIPLE VITAMINS/WOMENS) tablet Take 1 tablet by mouth daily.    . Naphazoline-Glycerin (REDNESS RELIEF OP) Place 1 drop into both eyes daily as needed (redness).  . pantoprazole (PROTONIX) 40 MG tablet Take 1 tablet (40 mg total) by mouth daily before breakfast.  . Probiotic Product (PROBIOTIC DAILY PO) Take 1 capsule by mouth at bedtime.   . ramipril (ALTACE) 2.5 MG capsule Take 1 capsule (2.5 mg total) by mouth daily.  . rosuvastatin (CRESTOR) 5 MG tablet Take 1 tablet (5 mg total) by mouth at bedtime.  . [DISCONTINUED] rosuvastatin (CRESTOR) 10 MG tablet Take 0.5 tablets (5 mg total) by mouth daily. (Patient taking differently: Take 5 mg by mouth at bedtime. )   No facility-administered encounter medications on file as of 04/14/2020.    ALLERGIES: Allergies  Allergen Reactions  . Citrus     In large amounts causes congestion / acid reflux   . Codeine Itching  . Milk-Related Compounds     Constipation   . Onion     gas  . Pantoprazole     Felt like I was going to pass out    VACCINATION STATUS: Immunization History  Administered Date(s) Administered  . H1N1 12/28/2008  . Influenza Split 10/01/2011, 10/09/2012  . Influenza Whole 08/13/2007, 09/06/2008, 08/24/2009, 11/21/2010  . Influenza,inj,Quad  PF,6+ Mos 09/10/2013, 08/11/2014, 08/02/2015, 09/13/2016, 10/29/2017  . PPD Test 06/09/2013  . Pneumococcal Conjugate-13 04/14/2015  . Pneumococcal Polysaccharide-23 10/25/2004, 09/10/2013  . Td 12/04/1995, 08/24/2009  . Tdap 12/25/2012    Diabetes She presents for her follow-up diabetic visit. She has type 2 diabetes mellitus. Onset time: She was diagnosed at approximate age  of 49 years. Her disease course has been worsening. There are no hypoglycemic associated symptoms. Pertinent negatives for hypoglycemia include no confusion, headaches, pallor or seizures. Pertinent negatives for diabetes include no blurred vision, no chest pain, no fatigue, no polydipsia, no polyphagia and no polyuria. There are no hypoglycemic complications. Symptoms are improving. There are no diabetic complications. Risk factors for coronary artery disease include diabetes mellitus, post-menopausal, sedentary lifestyle and dyslipidemia. Current diabetic treatments: She is taking metformin 500 mg p.o. daily.  She was supposed to take  Januvia, however is not taking it. Her weight is decreasing steadily. She is following a generally unhealthy diet. When asked about meal planning, she reported none. She has not had a previous visit with a dietitian. Her home blood glucose trend is decreasing steadily. Her breakfast blood glucose range is generally 140-180 mg/dl. Her bedtime blood glucose range is generally 140-180 mg/dl. Her overall blood glucose range is 140-180 mg/dl. (Recently patient is losing weight, mainly unintended.  Her recent previsit labs show A1c of 7.5% improving from  > 15.5%.   ) An ACE inhibitor/angiotensin II receptor blocker is not being taken. Eye exam is not current.  Hyperlipidemia This is a chronic problem. The current episode started more than 1 year ago. Exacerbating diseases include diabetes. Pertinent negatives include no chest pain, myalgias or shortness of breath. She is currently on no  antihyperlipidemic treatment. Risk factors for coronary artery disease include dyslipidemia, diabetes mellitus, a sedentary lifestyle, post-menopausal and family history.    Review of systems  Constitutional: + Unintended weight loss,   current  Body mass index is 22.04 kg/m. , no fatigue, no subjective hyperthermia, no subjective hypothermia Eyes: no blurry vision, no xerophthalmia ENT: no sore throat, no nodules palpated in throat, no dysphagia/odynophagia, no hoarseness Cardiovascular: no Chest Pain, no Shortness of Breath, no palpitations, no leg swelling Respiratory: no cough, no shortness of breath Gastrointestinal: + She complains of constipation, no vomiting no diarrhea.  Musculoskeletal: + She walks with a cane due to disequilibrium ,no muscle/joint aches Skin: no rashes, no hyperemia Neurological: no tremors, no numbness, no tingling, no dizziness Psychiatric: no depression, no anxiety   Objective:    BP 132/84   Pulse (!) 106   Ht 5\' 3"  (1.6 m)   Wt 124 lb 6.4 oz (56.4 kg)   BMI 22.04 kg/m   Wt Readings from Last 3 Encounters:  04/14/20 124 lb 6.4 oz (56.4 kg)  03/30/20 120 lb (54.4 kg)  03/21/20 124 lb (56.2 kg)     Physical Exam Constitutional:      Appearance: She is well-developed.  HENT:     Head: Normocephalic and atraumatic.  Neck:     Thyroid: No thyromegaly.     Trachea: No tracheal deviation.  Cardiovascular:     Rate and Rhythm: Normal rate.  Pulmonary:     Effort: Pulmonary effort is normal.  Abdominal:     Tenderness: There is no abdominal tenderness. There is no guarding.  Musculoskeletal:        General: Normal range of motion.     Cervical back: Normal range of motion and neck supple.     Comments: Foot exam: sensation intact to light touch, no lesions or deformities. Good pedal pulses. Normal response to monofilament tests. -She walks with a cane due to disequilibrium.    Skin:    General: Skin is warm and dry.     Coloration: Skin is  not pale.     Findings: No  erythema or rash.  Neurological:     Mental Status: She is alert and oriented to person, place, and time.     Cranial Nerves: No cranial nerve deficit.     Coordination: Coordination normal.     Deep Tendon Reflexes: Reflexes are normal and symmetric.  Psychiatric:     Comments: Noncompliant, unconcerned affect.  Patient is in denial of her diabetes.  She appears to minimize her symptoms from hypoglycemia.     CMP ( most recent) CMP     Component Value Date/Time   NA 137 02/26/2020 1040   K 4.0 02/26/2020 1040   CL 100 02/26/2020 1040   CO2 26 02/26/2020 1040   GLUCOSE 190 (H) 02/26/2020 1040   BUN 15 02/26/2020 1040   CREATININE 0.45 (L) 02/26/2020 1040   CALCIUM 9.6 02/26/2020 1040   PROT 7.0 02/26/2020 1040   ALBUMIN 3.9 07/23/2019 1616   AST 19 02/26/2020 1040   ALT 15 02/26/2020 1040   ALKPHOS 107 07/23/2019 1616   BILITOT 0.4 02/26/2020 1040   GFRNONAA 105 02/26/2020 1040   GFRAA 122 02/26/2020 1040     Diabetic Labs (most recent): Lab Results  Component Value Date   HGBA1C 7.5 (H) 03/07/2020   HGBA1C >15.5 (H) 07/23/2019   HGBA1C 13.6 (H) 10/28/2017     Lipid Panel ( most recent) Lipid Panel     Component Value Date/Time   CHOL 167 02/26/2020 1040   TRIG 69 02/26/2020 1040   HDL 52 02/26/2020 1040   CHOLHDL 3.2 02/26/2020 1040   VLDL 22 07/19/2017 1102   LDLCALC 99 02/26/2020 1040   LDLCALC 94 12/07/2014 0000      Lab Results  Component Value Date   TSH 1.25 03/07/2020   TSH 0.86 02/18/2017   TSH 2.126 04/14/2015   TSH 2.070 04/14/2014   TSH 2.190 04/15/2013   TSH 1.638 10/01/2011   TSH 1.831 03/09/2010   TSH 2.191 12/28/2008      Assessment & Plan:   1. Uncontrolled type 2 diabetes mellitus with hyperglycemia ([place])  - [name] has currently uncontrolled symptomatic type 2 DM since  64 years of age.  Recently patient is losing weight, mainly unintended.  Her recent previsit labs show A1c of 7.5%  improving from  > 15.5%.    - I had a long discussion with her about the progressive nature of diabetes and the pathology behind its complications. -her diabetes is complicated by noncompliance and she remains at a high risk for more acute and chronic complications which include CAD, CVA, CKD, retinopathy, and neuropathy. These are all discussed in detail with her. -She has unintended weight loss in recent weeks, patient is encouraged to switch to  unprocessed or minimally processed   complex starch and increased protein intake (animal or plant source), fruits, and vegetables. -  she is advised to stick to a routine mealtimes to eat 3 meals  a day and avoid unnecessary snacks ( to snack only to correct hypoglycemia).    -  Suggestion is made for her to avoid simple carbohydrates  from her diet including Cakes, Sweet Desserts / Pastries, Ice Cream, Soda (diet and regular), Sweet Tea, Candies, Chips, Cookies, Sweet Pastries,  Store Bought Juices, Alcohol in Excess of  1-2 drinks a day, Artificial Sweeteners, Coffee Creamer, and "Sugar-free" Products. This will help patient to have stable blood glucose profile and potentially avoid unintended weight gain.  -Given her presentation with controlled glycemic profile as a result of  her weight loss, she would not need insulin treatment.  She is already taken off of Metformin at this time.   -She will be continued at a lower dose of glipizide 5 mg daily at breakfast, she agrees to monitor blood glucose twice a day-daily before breakfast and at bedtime.  - she is encouraged to call clinic for blood glucose levels less than 70 or above 300 mg /dl.  -Her recent foot exam today  is essentially normal.  She is advised to consult with a podiatrist for measurements and will sign off on her diabetic shoes on subsequent visit.  - Specific targets for  A1c;  LDL, HDL, Triglycerides, and  Waist Circumference were discussed with the patient.  2) Blood Pressure  /Hypertension:  her blood pressure is controlled to target.  She is tolerating her chronically.  She is advised to continue ramipril 2.5 mg p.o. daily at breakfast.    3) Lipids/Hyperlipidemia: Review of her recent lipid panel showed hypertriglyceridemia and high LDL.  Since her last visit, she was initiated on rosuvastatin 5 mg p.o. nightly.  She is advised to continue.   4)  Weight/Diet: Her BMI is 22, dropping from 27.  She is not a candidate for weight loss anymore especially unintentionally.  -She is advised to finish her GI work-up.  5) Chronic Care/Health Maintenance:  -she  Is not on ACEI/ARB and Statin medications and  is encouraged to initiate and continue to follow up with Ophthalmology, Dentist,  Podiatrist at least yearly or according to recommendations, and advised to   stay away from smoking. I have recommended yearly flu vaccine and pneumonia vaccine at least every 5 years; moderate intensity exercise for up to 150 minutes weekly; and  sleep for at least 7 hours a day.  - she is  advised to maintain close follow up with [name], MD for primary care needs, as well as her other providers for optimal and coordinated care.  - Time spent on this patient care encounter:  35 min, of which > 50% was spent in  counseling and the rest reviewing her blood glucose logs , discussing her hypoglycemia and hyperglycemia episodes, reviewing her current and  previous labs / studies  ( including abstraction from other facilities) and medications  doses and developing a  long term treatment plan and documenting her care.   Please refer to Patient Instructions for Blood Glucose Monitoring and Insulin/Medications Dosing Guide"  in media tab for additional information. Please  also refer to " Patient Self Inventory" in the Media  tab for reviewed elements of pertinent patient history.  [name] participated in the discussions, expressed understanding, and voiced agreement with the above  plans.  All questions were answered to her satisfaction. she is encouraged to contact clinic should she have any questions or concerns prior to her return visit.   Follow up plan: - Return in about 3 months (around 07/15/2020) for Bring Meter and Logs- A1c in Office.  Glade Lloyd, MD [facility] [address] [place], [place] [address] Phone: [phone]  Fax: [phone]    04/14/2020, 6:16 PM  This note was partially dictated with voice recognition software. Similar sounding words can be transcribed inadequately or may not  be corrected upon review.

## 2020-04-14 NOTE — Progress Notes (Signed)
Medical Nutrition Therapy:  Appt start time: 1500  end time:  1530  Assessment:  Primary concerns today: DM Type 2. Follow up  Drinks 2 High Protein shakes  And 1 GLucerna daily. Eating better and feels better.  Gained 4 lbs. Takes a probiotic.  Glipizide  5 mg daily. BS 110-130's usually. Eating better balanced meals and getting in more calories. Walks with a cane.  Making progress. A1C doing well at 7.5%. No hypoglycemia.  Lab Results  Component Value Date   HGBA1C 7.5 (H) 03/07/2020   . CMP Latest Ref Rng & Units 02/26/2020 12/28/2019 07/23/2019  Glucose 65 - 99 mg/dL 190(H) 290(H) 971(HH)  BUN 7 - 25 mg/dL 15 10 20   Creatinine 0.50 - 0.99 mg/dL 0.45(L) 0.66 0.82  Sodium 135 - 146 mmol/L 137 125(L) 123(L)  Potassium 3.5 - 5.3 mmol/L 4.0 3.3(L) 4.7  Chloride 98 - 110 mmol/L 100 90(L) 87(L)  CO2 20 - 32 mmol/L 26 24 22   Calcium 8.6 - 10.4 mg/dL 9.6 9.3 8.9  Total Protein 6.1 - 8.1 g/dL 7.0 - -  Total Bilirubin 0.2 - 1.2 mg/dL 0.4 - -  Alkaline Phos 38 - 126 U/L - - -  AST 10 - 35 U/L 19 - -  ALT 6 - 29 U/L 15 - -   CBC    Component Value Date/Time   WBC 9.3 02/26/2020 1040   RBC 4.66 02/26/2020 1040   HGB 12.7 02/26/2020 1040   HCT 39.3 02/26/2020 1040   PLT 193 02/26/2020 1040   MCV 84.3 02/26/2020 1040   MCH 27.3 02/26/2020 1040   MCHC 32.3 02/26/2020 1040   RDW 13.8 02/26/2020 1040   LYMPHSABS 3.3 04/14/2015 1535   MONOABS 0.8 04/14/2015 1535   EOSABS 0.5 04/14/2015 1535   BASOSABS 0.1 04/14/2015 1535    Preferred Learning Style:  No preference indicated   Learning Readiness:  Ready  Change in progress   MEDICATIONS: see list   DIETARY INTAKE:  24-hr recall:  B ( AM):   2 Eggs and cheese, 2 bacon, wheat toast 2 slices Snk ( AM):   L ( PM):  Vienna sausage and cheetos and strawberries, HIgh Protein Ensure roast, squash, onions, rice pudding, turnip greens, water Snk ( PM):  D ( PM): Protein drink, , cheese crackers, nuts, water Snk ( PM):   Beverages: water  Usual physical activity: ADL  Estimated energy needs: 1500-1800  calories 170 g carbohydrates 112 g protein 42 g fat  Progress Towards Goal(s):  In progress.   Nutritional Diagnosis:  NB-1.1 Food and nutrition-related knowledge deficit As related to Diabetes Type 2.  As evidenced by A1C 7.5%. It had been as high as 15% per patient..    Intervention:  Nutrition and Diabetes education provided on My Plate, CHO counting, meal planning, portion sizes, timing of meals, avoiding snacks between meals unless having a low blood sugar, target ranges for A1C and blood sugars, signs/symptoms and treatment of hyper/hypoglycemia, monitoring blood sugars, taking medications as prescribed, benefits of exercising 30 minutes per day and prevention of complications of DM.    Marland KitchenGoals Eat three meals a day on time Do not skip meals Drink Glucerna with lunch or dinner Increase fresh fruits and vegetables. Drink 4 bottles of water per day Talk to MD about if you need to continue iron pills  Gain 2 lbs per month. Goal wt is 150 lbs.  Teaching Method Utilized:  Visual Auditory Hands on  Handouts given during visit include:  The Plate Method   Diabetes Instructions   Barriers to learning/adherence to lifestyle change: none  Demonstrated degree of understanding via:  Teach Back   Monitoring/Evaluation:  Dietary intake, exercise, and body weight in 3 month(s).

## 2020-04-19 ENCOUNTER — Encounter: Payer: Self-pay | Admitting: Nutrition

## 2020-04-26 ENCOUNTER — Telehealth: Payer: Self-pay | Admitting: Orthopedic Surgery

## 2020-04-26 NOTE — Telephone Encounter (Signed)
Called her told her if she wants a support for her back she can get over the counter, but not recommended because it weakens abdominal muscles, only to use it occasionally she voiced understanding.

## 2020-04-26 NOTE — Telephone Encounter (Signed)
Patient called to inquire about a brace - asking if there may be a type of brace for her back to help her to stand up straight. States she is going to the [facility] and was shown some basic exercises but thought if Dr Aline Brochure could make any recommendations?  Patient [phone]

## 2020-05-03 ENCOUNTER — Telehealth: Payer: Self-pay

## 2020-05-09 ENCOUNTER — Telehealth (INDEPENDENT_AMBULATORY_CARE_PROVIDER_SITE_OTHER): Payer: Medicare Other

## 2020-05-09 ENCOUNTER — Other Ambulatory Visit: Payer: Self-pay

## 2020-05-09 VITALS — BP 132/84 | Ht 63.0 in | Wt 124.0 lb

## 2020-05-09 DIAGNOSIS — R2681 Unsteadiness on feet: Secondary | ICD-10-CM

## 2020-05-09 DIAGNOSIS — Z780 Asymptomatic menopausal state: Secondary | ICD-10-CM

## 2020-05-09 DIAGNOSIS — E1165 Type 2 diabetes mellitus with hyperglycemia: Secondary | ICD-10-CM | POA: Diagnosis not present

## 2020-05-09 DIAGNOSIS — Z0000 Encounter for general adult medical examination without abnormal findings: Secondary | ICD-10-CM

## 2020-05-09 MED ORDER — UNABLE TO FIND: 0 refills | Status: DC

## 2020-05-09 NOTE — Progress Notes (Signed)
Subjective:   [name] is a 64 y.o. female who presents for Medicare Annual (Subsequent) preventive examination.  Review of Systems:   Cardiac Risk Factors include: advanced age (>50men, >3 women);diabetes mellitus;dyslipidemia;hypertension     Objective:     Vitals: BP 132/84   Ht 5\' 3"  (1.6 m)   Wt 124 lb (56.2 kg)   BMI 21.97 kg/m   Body mass index is 21.97 kg/m.  Advanced Directives 03/23/2020 07/27/2019 07/23/2019 03/10/2019 06/17/2017 04/14/2015  Does Patient Have a Medical Advance Directive? No No No Yes Yes No  Type of Advance Directive - - - Living will Living will;Healthcare Power of Attorney -  Does patient want to make changes to medical advance directive? - - - No - Patient declined No - Patient declined -  Copy of [place] in Chart? - - - - Yes -  Would patient like information on creating a medical advance directive? No - Patient declined No - Patient declined No - Patient declined - - -    Tobacco Social History   Tobacco Use  Smoking Status Never Smoker  Smokeless Tobacco Never Used     Counseling given: Not Answered   Clinical Intake:  Pre-visit preparation completed: Yes  Pain : No/denies pain Pain Score: 0-No pain     Nutritional Status: BMI of 19-24  Normal Diabetes: Yes CBG done?: No Did pt. bring in CBG monitor from home?: No  How often do you need to have someone help you when you read instructions, pamphlets, or other written materials from your doctor or pharmacy?: 2 - Rarely  Interpreter Needed?: No     Past Medical History:  Diagnosis Date  . Allergic rhinitis   . ALLERGIC RHINITIS, SEASONAL 02/27/2008   Qualifier: Diagnosis of  By: Rebecca Eaton LPN, [place] Cerise    . Anemia, deficiency   . Annual physical exam 08/15/2015  . Diabetes mellitus   . Educated about COVID-19 virus infection 05/01/2019  . GERD 02/27/2008   Qualifier: Diagnosis of  By: Rebecca Eaton LPN, [place] Cerise    . GERD (gastroesophageal reflux disease)   .  Hyperlipidemia   . Hypertension   . Obesity   . Post-menopausal bleeding 08/15/2015   Past Surgical History:  Procedure Laterality Date  . BIOPSY  03/23/2020   Procedure: BIOPSY;  Surgeon: Rogene Houston, MD;  [facility];  Service: Endoscopy;;  gastric  . ESOPHAGOGASTRODUODENOSCOPY (EGD) WITH PROPOFOL N/A 03/23/2020   Procedure: ESOPHAGOGASTRODUODENOSCOPY (EGD) WITH PROPOFOL;  Surgeon: Rogene Houston, MD;  [facility];  Service: Endoscopy;  Laterality: N/A;  125  . EXTRACTION OF SEVENTH TEETH  2007  . MALONEY DILATION  03/23/2020   Procedure: MALONEY DILATION;  Surgeon: Rogene Houston, MD;  [facility];  Service: Endoscopy;;  . REMOVAL OF GREAT TOENAIL  2005  . SPLENECTOMY  2002   Family History  Problem Relation Age of Onset  . Diabetes Mother   . Hypertension Mother   . Hyperlipidemia Mother   . Lung disease Father   . Hypertension Sister   . Hyperlipidemia Sister   . Hypertension Brother   . Sickle cell trait Son   . Arthritis Other   . Asthma Other   . Cancer Other    Social History   Socioeconomic History  . Marital status: Divorced    Spouse name: Not on file  . Number of children: 1  . Years of education: Not on file  . Highest education  level: Not on file  Occupational History  . Occupation: DISABLED   Tobacco Use  . Smoking status: Never Smoker  . Smokeless tobacco: Never Used  Substance and Sexual Activity  . Alcohol use: No  . Drug use: No  . Sexual activity: Yes    Birth control/protection: Post-menopausal  Other Topics Concern  . Not on file  Social History Narrative  . Not on file   Social Determinants of Health   Financial Resource Strain: Medium Risk  . Difficulty of Paying Living Expenses: Somewhat hard  Food Insecurity: Food Insecurity Present  . Worried About Charity fundraiser in the Last Year: Sometimes true  . Ran Out of Food in the Last Year: Sometimes true  Transportation Needs: No  Transportation Needs  . Lack of Transportation (Medical): No  . Lack of Transportation (Non-Medical): No  Physical Activity: Sufficiently Active  . Days of Exercise per Week: 5 days  . Minutes of Exercise per Session: 60 min  Stress: Stress Concern Present  . Feeling of Stress : To some extent  Social Connections: Slightly Isolated  . Frequency of Communication with Friends and Family: More than three times a week  . Frequency of Social Gatherings with Friends and Family: More than three times a week  . Attends Religious Services: More than 4 times per year  . Active Member of Clubs or Organizations: Yes  . Attends Archivist Meetings: Never  . Marital Status: Divorced    Outpatient Encounter Medications as of 05/09/2020  Medication Sig  . acetaminophen (TYLENOL) 650 MG CR tablet Take 1,300 mg by mouth every 8 (eight) hours as needed for pain.  Marland Kitchen alum & mag hydroxide-simeth (MAALOX/MYLANTA) 200-200-20 MG/5ML suspension Take 30 mLs by mouth every 6 (six) hours as needed for indigestion or heartburn.  . ASPERCREME LIDOCAINE EX Apply 1 application topically daily as needed (pain).  . Cholecalciferol (DIALYVITE VITAMIN D 5000) 125 MCG (5000 UT) capsule Take 5,000 Units by mouth daily.  . COLOSTRUM PO Take 3 tablets by mouth daily before breakfast.  . cyclobenzaprine (FLEXERIL) 5 MG tablet Take 1 tablet (5 mg total) by mouth 3 (three) times daily as needed for muscle spasms.  Marland Kitchen docusate sodium (COLACE) 100 MG capsule Take 100 mg by mouth at bedtime.  Marland Kitchen ECHINACEA PO Take 760 mg by mouth daily.  . feeding supplement, GLUCERNA SHAKE, (GLUCERNA SHAKE) LIQD Take 237 mLs by mouth daily.  . Ferrous Sulfate (IRON PO) Take 25 mg by mouth daily.  . Garlic 6270 MG CAPS Take 1,000 mg by mouth at bedtime.  Marland Kitchen glipiZIDE (GLUCOTROL XL) 5 MG 24 hr tablet Take 5 mg by mouth daily with breakfast.  . glucose blood (ACCU-CHEK GUIDE) test strip USE 1 STRIP TO CHECK GLUCOSE TWICE DAILY  . Lancets MISC 1  each by Does not apply route as directed.  . meloxicam (MOBIC) 7.5 MG tablet Take 1 tablet (7.5 mg total) by mouth daily.  . Multiple Vitamins-Minerals (MULTIPLE VITAMINS/WOMENS) tablet Take 1 tablet by mouth daily.    . Naphazoline-Glycerin (REDNESS RELIEF OP) Place 1 drop into both eyes daily as needed (redness).  . pantoprazole (PROTONIX) 40 MG tablet Take 1 tablet (40 mg total) by mouth daily before breakfast.  . Probiotic Product (PROBIOTIC DAILY PO) Take 1 capsule by mouth at bedtime.   . ramipril (ALTACE) 2.5 MG capsule Take 1 capsule (2.5 mg total) by mouth daily.  . rosuvastatin (CRESTOR) 5 MG tablet Take 1 tablet (5 mg total) by  mouth at bedtime.  Marland Kitchen UNABLE TO FIND Posture Cane x 1  DX R26.81  . UNABLE TO FIND Digital scales x 1   No facility-administered encounter medications on file as of 05/09/2020.    Activities of Daily Living In your present state of health, do you have any difficulty performing the following activities: 05/09/2020 05/09/2020  Hearing? N N  Vision? N N  Difficulty concentrating or making decisions? N N  Walking or climbing stairs? N Y  Dressing or bathing? Y N  Doing errands, shopping? N Y  Conservation officer, nature and eating ? N -  Using the Toilet? N -  In the past six months, have you accidently leaked urine? N -  Do you have problems with loss of bowel control? N -  Managing your Medications? N -  Managing your Finances? N -  Housekeeping or managing your Housekeeping? N -  Some recent data might be hidden    Patient Care Team: [name], MD as PCP - General Carole Civil, MD as Consulting Physician (Orthopedic Surgery) Leona Singleton, RN as [place] Management    Assessment:   This is a routine wellness examination for [place].  Exercise Activities and Dietary recommendations Current Exercise Habits: Structured exercise class, Time (Minutes): 60, Frequency (Times/Week): 5, Weekly Exercise (Minutes/Week): 300,  Intensity: Mild  Goals    . DIET - EAT MORE FRUITS AND VEGETABLES    . DIET - INCREASE WATER INTAKE    . Prevent falls       Fall Risk Fall Risk  05/09/2020 03/21/2020 02/25/2020 12/24/2019 08/19/2019  Falls in the past year? 1 1 1  0 0  Number falls in past yr: 0 1 1 0 0  Injury with Fall? 0 1 0 0 0  Risk for fall due to : - Impaired mobility;Impaired balance/gait - - -  Follow up - Falls prevention discussed - - -   Is the patient's home free of loose throw rugs in walkways, pet beds, electrical cords, etc?   yes      Grab bars in the bathroom? no      Handrails on the stairs?   yes      Adequate lighting?   yes  Timed Get Up and Go performed: unable due to virtual visit   Depression Screen PHQ 2/9 Scores 03/21/2020 12/24/2019 08/19/2019 07/23/2019  PHQ - 2 Score 0 0 0 0  PHQ- 9 Score - - - -     Cognitive Function     6CIT Screen 05/09/2020 03/10/2019 06/17/2017  What Year? 0 points 0 points 0 points  What month? 0 points 0 points 0 points  What time? 0 points 0 points 0 points  Count back from 20 0 points 0 points 0 points  Months in reverse 0 points 0 points 0 points  Repeat phrase 4 points 6 points 0 points  Total Score 4 6 0    Immunization History  Administered Date(s) Administered  . H1N1 12/28/2008  . Influenza Split 10/01/2011, 10/09/2012  . Influenza Whole 08/13/2007, 09/06/2008, 08/24/2009, 11/21/2010  . Influenza,inj,Quad PF,6+ Mos 09/10/2013, 08/11/2014, 08/02/2015, 09/13/2016, 10/29/2017  . PPD Test 06/09/2013  . Pneumococcal Conjugate-13 04/14/2015  . Pneumococcal Polysaccharide-23 10/25/2004, 09/10/2013  . Td 12/04/1995, 08/24/2009  . Tdap 12/25/2012    Qualifies for Shingles Vaccine? Qualifies- can get at pharmacy if wanted   Screening Tests Health Maintenance  Topic Date Due  . COVID-19 Vaccine (1) Never done  . OPHTHALMOLOGY EXAM  01/25/2016  . DEXA SCAN  02/02/2020  . PNA vac Low Risk Adult (2 of 2 - PPSV23) 02/02/2020  . MAMMOGRAM  08/18/2020  (Originally 01/14/2015)  . COLONOSCOPY  05/09/2021 (Originally 01/04/2018)  . INFLUENZA VACCINE  07/03/2020  . FOOT EXAM  08/18/2020  . HEMOGLOBIN A1C  09/06/2020  . PAP SMEAR-Modifier  08/18/2022  . TETANUS/TDAP  12/25/2022  . Hepatitis C Screening  Completed  . HIV Screening  Completed    Cancer Screenings: Lung: Low Dose CT Chest recommended if Age 21-80 years, 30 pack-year currently smoking OR have quit w/in 15years. Patient does not qualify. Breast:  Up to date on Mammogram? Yes   Up to date of Bone Density/Dexa? No Colorectal: no. declined  Additional Screenings:  Hepatitis C Screening: completed     Plan:     I have personally reviewed and noted the following in the patient's chart:   . Medical and social history . Use of alcohol, tobacco or illicit drugs  . Current medications and supplements . Functional ability and status . Nutritional status . Physical activity . Advanced directives . List of other physicians . Hospitalizations, surgeries, and ER visits in previous 12 months . Vitals . Screenings to include cognitive, depression, and falls . Referrals and appointments  In addition, I have reviewed and discussed with patient certain preventive protocols, quality metrics, and best practice recommendations. A written personalized care plan for preventive services as well as general preventive health recommendations were provided to patient.     Kate Sable, LPN, LPN  08/10/220

## 2020-05-09 NOTE — Patient Instructions (Signed)
[name] , Thank you for taking time to come for your Medicare Wellness Visit. I appreciate your ongoing commitment to your health goals. Please review the following plan we discussed and let me know if I can assist you in the future.   Screening recommendations/referrals: Colonoscopy: declined Mammogram: up to date Bone Density: scheduled Recommended yearly ophthalmology/optometry visit for glaucoma screening and checkup Recommended yearly dental visit for hygiene and checkup  Vaccinations: Influenza vaccine: needs in fall of 2021 Pneumococcal vaccine: due (appt scheduled) Tdap vaccine: up to date  Shingles vaccine: will check with pharmacy      Next appointment: wellness in 1 year   Preventive Care 65 Years and Older, Female Preventive care refers to lifestyle choices and visits with your health care provider that can promote health and wellness. What does preventive care include?  A yearly physical exam. This is also called an annual well check.  Dental exams once or twice a year.  Routine eye exams. Ask your health care provider how often you should have your eyes checked.  Personal lifestyle choices, including:  Daily care of your teeth and gums.  Regular physical activity.  Eating a healthy diet.  Avoiding tobacco and drug use.  Limiting alcohol use.  Practicing safe sex.  Taking low-dose aspirin every day.  Taking vitamin and mineral supplements as recommended by your health care provider. What happens during an annual well check? The services and screenings done by your health care provider during your annual well check will depend on your age, overall health, lifestyle risk factors, and family history of disease. Counseling  Your health care provider may ask you questions about your:  Alcohol use.  Tobacco use.  Drug use.  Emotional well-being.  Home and relationship well-being.  Sexual activity.  Eating habits.  History of falls.  Memory  and ability to understand (cognition).  Work and work Statistician.  Reproductive health. Screening  You may have the following tests or measurements:  Height, weight, and BMI.  Blood pressure.  Lipid and cholesterol levels. These may be checked every 5 years, or more frequently if you are over 70 years old.  Skin check.  Lung cancer screening. You may have this screening every year starting at age 81 if you have a 30-pack-year history of smoking and currently smoke or have quit within the past 15 years.  Fecal occult blood test (FOBT) of the stool. You may have this test every year starting at age 37.  Flexible sigmoidoscopy or colonoscopy. You may have a sigmoidoscopy every 5 years or a colonoscopy every 10 years starting at age 68.  Hepatitis C blood test.  Hepatitis B blood test.  Sexually transmitted disease (STD) testing.  Diabetes screening. This is done by checking your blood sugar (glucose) after you have not eaten for a while (fasting). You may have this done every 1-3 years.  Bone density scan. This is done to screen for osteoporosis. You may have this done starting at age 50.  Mammogram. This may be done every 1-2 years. Talk to your health care provider about how often you should have regular mammograms. Talk with your health care provider about your test results, treatment options, and if necessary, the need for more tests. Vaccines  Your health care provider may recommend certain vaccines, such as:  Influenza vaccine. This is recommended every year.  Tetanus, diphtheria, and acellular pertussis (Tdap, Td) vaccine. You may need a Td booster every 10 years.  Zoster vaccine. You may need  this after age 7.  Pneumococcal 13-valent conjugate (PCV13) vaccine. One dose is recommended after age 54.  Pneumococcal polysaccharide (PPSV23) vaccine. One dose is recommended after age 20. Talk to your health care provider about which screenings and vaccines you need and  how often you need them. This information is not intended to replace advice given to you by your health care provider. Make sure you discuss any questions you have with your health care provider. Document Released: 12/16/2015 Document Revised: 08/08/2016 Document Reviewed: 09/20/2015 Elsevier Interactive Patient Education  2017 [employer] Prevention in the Home Falls can cause injuries. They can happen to people of all ages. There are many things you can do to make your home safe and to help prevent falls. What can I do on the outside of my home?  Regularly fix the edges of walkways and driveways and fix any cracks.  Remove anything that might make you trip as you walk through a door, such as a raised step or threshold.  Trim any bushes or trees on the path to your home.  Use bright outdoor lighting.  Clear any walking paths of anything that might make someone trip, such as rocks or tools.  Regularly check to see if handrails are loose or broken. Make sure that both sides of any steps have handrails.  Any raised decks and porches should have guardrails on the edges.  Have any leaves, snow, or ice cleared regularly.  Use sand or salt on walking paths during winter.  Clean up any spills in your garage right away. This includes oil or grease spills. What can I do in the bathroom?  Use night lights.  Install grab bars by the toilet and in the tub and shower. Do not use towel bars as grab bars.  Use non-skid mats or decals in the tub or shower.  If you need to sit down in the shower, use a plastic, non-slip stool.  Keep the floor dry. Clean up any water that spills on the floor as soon as it happens.  Remove soap buildup in the tub or shower regularly.  Attach bath mats securely with double-sided non-slip rug tape.  Do not have throw rugs and other things on the floor that can make you trip. What can I do in the bedroom?  Use night lights.  Make sure that you  have a light by your bed that is easy to reach.  Do not use any sheets or blankets that are too big for your bed. They should not hang down onto the floor.  Have a firm chair that has side arms. You can use this for support while you get dressed.  Do not have throw rugs and other things on the floor that can make you trip. What can I do in the kitchen?  Clean up any spills right away.  Avoid walking on wet floors.  Keep items that you use a lot in easy-to-reach places.  If you need to reach something above you, use a strong step stool that has a grab bar.  Keep electrical cords out of the way.  Do not use floor polish or wax that makes floors slippery. If you must use wax, use non-skid floor wax.  Do not have throw rugs and other things on the floor that can make you trip. What can I do with my stairs?  Do not leave any items on the stairs.  Make sure that there are handrails on both sides of  the stairs and use them. Fix handrails that are broken or loose. Make sure that handrails are as long as the stairways.  Check any carpeting to make sure that it is firmly attached to the stairs. Fix any carpet that is loose or worn.  Avoid having throw rugs at the top or bottom of the stairs. If you do have throw rugs, attach them to the floor with carpet tape.  Make sure that you have a light switch at the top of the stairs and the bottom of the stairs. If you do not have them, ask someone to add them for you. What else can I do to help prevent falls?  Wear shoes that:  Do not have high heels.  Have rubber bottoms.  Are comfortable and fit you well.  Are closed at the toe. Do not wear sandals.  If you use a stepladder:  Make sure that it is fully opened. Do not climb a closed stepladder.  Make sure that both sides of the stepladder are locked into place.  Ask someone to hold it for you, if possible.  Clearly mark and make sure that you can see:  Any grab bars or  handrails.  First and last steps.  Where the edge of each step is.  Use tools that help you move around (mobility aids) if they are needed. These include:  Canes.  Walkers.  Scooters.  Crutches.  Turn on the lights when you go into a dark area. Replace any light bulbs as soon as they burn out.  Set up your furniture so you have a clear path. Avoid moving your furniture around.  If any of your floors are uneven, fix them.  If there are any pets around you, be aware of where they are.  Review your medicines with your doctor. Some medicines can make you feel dizzy. This can increase your chance of falling. Ask your doctor what other things that you can do to help prevent falls. This information is not intended to replace advice given to you by your health care provider. Make sure you discuss any questions you have with your health care provider. Document Released: 09/15/2009 Document Revised: 04/26/2016 Document Reviewed: 12/24/2014 Elsevier Interactive Patient Education  2017 [employer].

## 2020-05-10 ENCOUNTER — Other Ambulatory Visit: Payer: Self-pay | Admitting: *Deleted

## 2020-05-10 NOTE — Patient Outreach (Signed)
[facility]) Care Management  05/10/2020  [name] 03/02/1956 [id number]   Telephone Screen  Referral Date:  05/03/2020 Referral Source:  EMMI Prevent Reason for Referral:  EMMI Prevent Screening/Join Care Management Insurance:  [employer] Medicare   Outreach Attempt: Successful telephone outreach to patient for telephone screening.  HIPAA verified with patient.  Patient completed telephone screening.  Social: Lives at home with 65 year old granddaughter whom assist patient with care.  Ambulates with cane and reports about 1 fall in the past year.  Patient is reporting now being independent with ADLs and granddaughter assisting IADLs of cooking, completing cleaning and assisting with household management of paying bills.  Drives self to medical appointments.  Patient reporting she has lots of issues with her current housing and has trouble paying bills due to higher rent which causes her stress and leaves her depressed.  She is stating toilet is too low for her to get off of, tub has black mold around edges, and sink does not work and water gets too hot and burns her hands.  DME in the home include: cane, rolling walker, shower chair with back, glasses, upper partials, CBG meter, and blood pressure cuff.  Reports most of her home equipment she has purchased from [employer] or pawn shops.  After recent discharge from hospital, patient stating she was unable to get medical equipment from DME agency but she is unsure why.  She would like to get bedside commode or toilet seat lift to help use the bathroom and shower bench for bathing.  Encouraged patient to discuss issues with mold and other household problems with landlord.  Discussed [place] Work referral for depression resources and patient verbally agrees.  Also sending list of housing resources and food resources for her area.  Conditions:  Per chart review and discussion with patient, PMH include but not limited  to:  Anemia, diabetes, GERD, hyperlipidemia, and hypertension (patient denies history of high blood pressure).  Denies any recent emergency room visits since discharge from hospital in April of this year.  Reports constipation has been better since discharge.  Granddaughter has been assisting with care since discharge.  Monitors blood sugars daily.  Fasting blood sugar this morning was 180 with normal fasting ranges of 120-140's.  Latest Hgb A1C is 7.5.  Awaiting scale and new cane from prescription sent to [facility] by nurse at [facility] Visit yesterday.  Medications: Patient reports taking about 3 medications daily.  Manages medications herself and denies any issues with affording medications or questions concerning medications.  Appointments:  Attended Annual Wellness Visit with primary care 05/09/2020.  Attended last appointment with primary care provider, Cherly Beach NP on 02/25/2020.  Attended appointment with Endocrinologist, Dr. Dorris Fetch on 04/14/2020.  Advanced Directives:  Reports having Living Will and [place] in place and does not wish to make any changes at this time.   Consent:  [facility] services reviewed and discussed.  Patient verbally agrees to [place] Coordinator referral to assist with obtaining DME equipment and [place] Work referral for depression and housing resources.  Plan: RN Health Coach will send patient list of affordable housing resources and food resources for [facility]. RN Health Coach will send [facility] SW referral for possible assistance with community resources related to depression resources. RN Health Coach will send referral to [place] for further care coordination and assistance with obtaining DME for patient.  [place] Care Management  RN  Health Coach [phone] [email or web address]

## 2020-05-11 ENCOUNTER — Other Ambulatory Visit: Payer: Self-pay

## 2020-05-11 NOTE — Patient Outreach (Signed)
[facility]) Care Management  05/11/2020  [name] [birth year] [id number]   Telephone assessment: Reviewed referral received. Placed call to patient who reports she is ok. Reviewed reason for call..  Patient reports she needs help finding housing she can afford. Reports needing help with BSC, Shower bench.  Reviewed with patient to call MD office and get an order for DME.  Reviewed with patient to call [facility] customer service line and find out where she can get her DME in network close to where she lives. Reviewed with client importance of continuing to monitor CBG and taking all medications as prescribed.   PLAN: referral to social worker for housing assistance Will call back in 1 week to inquire about order from MD for DME. Will mail new patient packet.   Tomasa Rand, RN, BSN, CEN [facility] [employer] [phone]

## 2020-05-12 ENCOUNTER — Other Ambulatory Visit: Payer: Self-pay | Admitting: *Deleted

## 2020-05-12 NOTE — Patient Outreach (Signed)
[facility]) Care Management  05/12/2020  MAREA REASNER November 03, 1956 [id number]   CSW received referral on 05/11/2020 for assistance with housing, depression and finances.  CSW attempted initial outreach call today and was unable to reach pt nor able to leave voice message.  CSW will attempt outreach again in 3-4 business days per protocol.   Eduard Clos, MSW, [place] Worker  [place] [phone]

## 2020-05-15 ENCOUNTER — Other Ambulatory Visit: Payer: Self-pay | Admitting: Family Medicine

## 2020-05-17 ENCOUNTER — Other Ambulatory Visit: Payer: Self-pay | Admitting: *Deleted

## 2020-05-17 NOTE — Patient Outreach (Addendum)
[facility] Care Management  05/17/2020  [name] [birth year] [id number]   CSW received referral on 05/11/2020 to assist pt with resources for housing, financial support and depression. CSW made contact with pt today and confirmed her identity.  CSW introduced self, role and reason for call.  Per pt, her 65yo granddaughter is living with her to "look after me".  Pt reports she has spoken with [employer] program and is hoping to "get signed up" for their program/in home assistance.  Pt also states she is working with a Cabin crew to help her find a home to possibly buy- low income type purchase; "maybe a Habitat home".  Pt is currently paying over $600 and on her limited income is hopeful to find something cheaper.  CSW will mail pt some material on community resources that me be of benefit to her and help her to save on (ie: food expenses) and have more monies to use elsewhere with her budget.  Pt is receiving Medicare as well as Medicaid. She is not aware of transportation services through Medicaid or [employer] and CSW will mail her info on this as well.  Pt admits to feeling sad, overwhelmed and "situational depression".  She feels it is related to her desire/need for housing, caregiver,etc.  Pt is comforted to have her granddaughter with her to help now. She is hoping with a move to more affordable housing and getting CAPS worker she will feel less depressed.  Pt denies any current or history of SI/HI.  She also denies any medication for depression or other MI.   Pt eager to find out more about getting scales and other items- CSW advised pt plan to make [place] aware of this.   CSW will seek additional community support resources and plan to follow up with pt.   Eduard Clos, MSW, [place] Worker  [place] [phone]

## 2020-05-18 ENCOUNTER — Other Ambulatory Visit: Payer: Self-pay

## 2020-05-18 DIAGNOSIS — N200 Calculus of kidney: Secondary | ICD-10-CM | POA: Diagnosis not present

## 2020-05-18 DIAGNOSIS — R1013 Epigastric pain: Secondary | ICD-10-CM | POA: Diagnosis not present

## 2020-05-18 DIAGNOSIS — R110 Nausea: Secondary | ICD-10-CM | POA: Diagnosis not present

## 2020-05-18 DIAGNOSIS — R5383 Other fatigue: Secondary | ICD-10-CM | POA: Diagnosis not present

## 2020-05-18 DIAGNOSIS — Z9081 Acquired absence of spleen: Secondary | ICD-10-CM | POA: Diagnosis not present

## 2020-05-18 DIAGNOSIS — K3189 Other diseases of stomach and duodenum: Secondary | ICD-10-CM | POA: Diagnosis not present

## 2020-05-18 DIAGNOSIS — R509 Fever, unspecified: Secondary | ICD-10-CM | POA: Diagnosis not present

## 2020-05-18 DIAGNOSIS — K760 Fatty (change of) liver, not elsewhere classified: Secondary | ICD-10-CM | POA: Diagnosis not present

## 2020-05-18 DIAGNOSIS — I10 Essential (primary) hypertension: Secondary | ICD-10-CM | POA: Diagnosis not present

## 2020-05-18 DIAGNOSIS — E119 Type 2 diabetes mellitus without complications: Secondary | ICD-10-CM | POA: Diagnosis not present

## 2020-05-19 NOTE — Patient Outreach (Signed)
[facility]) Care Management  05/19/2020  MONTANA BRYNGELSON [birth year] [id number]   Telephone assessment:  Placed call to patient to follow up on her DME.  Last call to patient I explained how to get her DME. Patient has not followed through by calling [facility] or MD office. Again today I reviewed again about how to call [place] ( since [facility]) arranged DME through [facility].  I explained again to call MD office for RX for other equipment needed.  States she will get her granddaughter to assist her today.  PLAN: will follow up with patient to see if she was able to get her equipment. Denies any other needs today.  Tomasa Rand, RN, BSN, CEN [facility] [employer] [phone]

## 2020-05-20 DIAGNOSIS — N390 Urinary tract infection, site not specified: Secondary | ICD-10-CM | POA: Diagnosis not present

## 2020-05-20 DIAGNOSIS — E1165 Type 2 diabetes mellitus with hyperglycemia: Secondary | ICD-10-CM | POA: Diagnosis not present

## 2020-05-20 DIAGNOSIS — I10 Essential (primary) hypertension: Secondary | ICD-10-CM | POA: Diagnosis not present

## 2020-05-20 DIAGNOSIS — E119 Type 2 diabetes mellitus without complications: Secondary | ICD-10-CM | POA: Diagnosis not present

## 2020-05-23 ENCOUNTER — Ambulatory Visit: Payer: Medicare Other | Admitting: Family Medicine

## 2020-05-24 ENCOUNTER — Other Ambulatory Visit: Payer: Self-pay

## 2020-05-24 ENCOUNTER — Encounter: Payer: Self-pay | Admitting: Family Medicine

## 2020-05-24 ENCOUNTER — Ambulatory Visit (INDEPENDENT_AMBULATORY_CARE_PROVIDER_SITE_OTHER): Payer: Medicare Other | Admitting: Family Medicine

## 2020-05-24 VITALS — BP 127/84 | HR 100 | Temp 98.5°F | Resp 16 | Ht 63.0 in | Wt 117.0 lb

## 2020-05-24 DIAGNOSIS — R634 Abnormal weight loss: Secondary | ICD-10-CM | POA: Diagnosis not present

## 2020-05-24 DIAGNOSIS — R195 Other fecal abnormalities: Secondary | ICD-10-CM | POA: Diagnosis not present

## 2020-05-24 DIAGNOSIS — E1165 Type 2 diabetes mellitus with hyperglycemia: Secondary | ICD-10-CM

## 2020-05-24 DIAGNOSIS — C50911 Malignant neoplasm of unspecified site of right female breast: Secondary | ICD-10-CM

## 2020-05-24 DIAGNOSIS — B369 Superficial mycosis, unspecified: Secondary | ICD-10-CM | POA: Diagnosis not present

## 2020-05-24 DIAGNOSIS — E782 Mixed hyperlipidemia: Secondary | ICD-10-CM

## 2020-05-24 DIAGNOSIS — Z23 Encounter for immunization: Secondary | ICD-10-CM

## 2020-05-24 DIAGNOSIS — Z780 Asymptomatic menopausal state: Secondary | ICD-10-CM | POA: Diagnosis not present

## 2020-05-24 DIAGNOSIS — R269 Unspecified abnormalities of gait and mobility: Secondary | ICD-10-CM

## 2020-05-24 MED ORDER — GLIPIZIDE ER 10 MG PO TB24: 10.0000 mg | ORAL_TABLET | Freq: Every day | ORAL | 2 refills | Status: DC

## 2020-05-24 MED ORDER — CLOTRIMAZOLE-BETAMETHASONE 1-0.05 % EX CREA: TOPICAL_CREAM | CUTANEOUS | 0 refills | Status: DC

## 2020-05-24 NOTE — Progress Notes (Signed)
[name]     MRN: [id number]      DOB: 10-11-1956   HPI [name] is here for follow up and re-evaluation of chronic medical conditions, medication management and review of any available recent lab and radiology data.  Preventive health is updated, specifically  Cancer screening and Immunization.   Questions or concerns regarding consultations or procedures which the PT has had in the interim are  addressed. The PT denies any adverse reactions to current medications since the last visit.  There are no new concerns.  There are no specific complaints   ROS Denies recent fever or chills. Denies sinus pressure, nasal congestion, ear pain or sore throat. Denies chest congestion, productive cough or wheezing. Denies chest pains, palpitations and leg swelling Denies abdominal pain, nausea, vomiting,diarrhea or constipation.   Denies dysuria, frequency, hesitancy or incontinence. Denies joint pain, swelling and limitation in mobility. Denies headaches, seizures, numbness, or tingling. Denies depression, anxiety or insomnia. Denies skin break down or rash.   PE  BP 127/84   Pulse 100   Temp 98.5 F (36.9 C) (Temporal)   Resp 16   Ht 5\' 3"  (1.6 m)   Wt 117 lb (53.1 kg)   SpO2 98%   BMI 20.73 kg/m   Patient alert and oriented and in no cardiopulmonary distress. Ill appearing HEENT: No facial asymmetry, EOMI,     Neck supple .  Chest: Clear to auscultation bilaterally.  CVS: S1, S2 no murmurs, no S3.Regular rate.  ABD: Soft non tender.   Ext: No edema  MS: Adequate ROM spine, shoulders, hips and knees.  Skin: Intact, hyperpigmented macular  rash noted.on shoulders  Psych: Good eye contact, normal affect. Memory intact not anxious or depressed appearing.  CNS: CN 2-12 intact, power,  normal throughout.no focal deficits noted.   Assessment & Plan  Uncontrolled type 2 diabetes mellitus with hyperglycemia ([place]) [name] is reminded of the importance of commitment  to daily physical activity for 30 minutes or more, as able and the need to limit carbohydrate intake to 30 to 60 grams per meal to help with blood sugar control.   The need to take medication as prescribed, test blood sugar as directed, and to call between visits if there is a concern that blood sugar is uncontrolled is also discussed.   [name] is reminded of the importance of daily foot exam, annual eye examination, and good blood sugar, blood pressure and cholesterol control.  Diabetic Labs Latest Ref Rng & Units 03/07/2020 02/26/2020 12/28/2019 07/24/2019 07/23/2019  HbA1c <5.7 % of total Hgb 7.5(H) - - - -  Microalbumin Not Estab. ug/mL - - - <3.0(H) -  Micro/Creat Ratio 0 - 29 mg/g creat - - - <28 -  Chol <200 mg/dL - 167 - - -  HDL > OR = 50 mg/dL - 52 - - -  Calc LDL mg/dL (calc) - 99 - - -  Triglycerides <150 mg/dL - 69 - - -  Creatinine 0.50 - 0.99 mg/dL - 0.45(L) 0.66 - 0.82   BP/Weight 05/24/2020 05/09/2020 04/14/2020 03/30/2020 03/23/2020 04/30/4131 03/06/101  Systolic BP 725 366 440 347 425 - 956  Diastolic BP 84 84 84 81 82 - 68  Wt. (Lbs) 117 124 124.4 120 - 124 123.4  BMI 20.73 21.97 22.04 21.26 - 21.97 21.86   Foot/eye exam completion dates Latest Ref Rng & Units 08/19/2019 10/29/2017  Eye Exam No Retinopathy - -  Foot exam Order - - -  Foot Form Completion - Done Done    Improved and treated by Endo    Change in stool caliber Change in stool caliber, early satiety , unintentional weight loss, needs colonoscopy asap, referred  Hyperlipidemia Hyperlipidemia:Low fat diet discussed and encouraged.   Lipid Panel  Lab Results  Component Value Date   CHOL 167 02/26/2020   HDL 52 02/26/2020   LDLCALC 99 02/26/2020   TRIG 69 02/26/2020   CHOLHDL 3.2 02/26/2020  Controlled, no change in medication      Dermatomycosis Clotrimazole/ betamethasone twice daily for 10 days, refer derm if not improved. 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

## 2020-05-24 NOTE — Patient Instructions (Addendum)
F/u In office in 3 months, call if you need me sooner  You will be referred for colonoscopy if you have had none, you report stringy stool, balls/ turds andweight loss x 1 year  Good that bloodsugar Now controlled  Cream prescribed for twice daily use rash on.  Please do this for approximately 2 weeks if not much better please call for referral to dermatology.  You request a straight cane to help with walking as you are experiencing excessive bending over and poor balance prescription will be sent to [facility].I am also referring you for bone density testing You  request a scale.  Since you are losing weight and on the lower end of normal we will try to see if this is covered

## 2020-05-25 ENCOUNTER — Other Ambulatory Visit: Payer: Self-pay

## 2020-05-25 NOTE — Patient Outreach (Addendum)
[facility]) Care Management  05/25/2020  [name] 10-20-1956 [id number]   Telephone assessment:  Placed call to patient who answered and reports she is doing well.  Reports she saw her primary MD.  States that she call [place] to reviewed her coverage and states that she has decided to change plans to [facility] starting 06/02/2020.   Reports she will be able to get her needed equipment, dental coverage and better medication coverage.   Reports she received packet in mail from Education officer, museum and now needs to know what todo with it.  ..  PLAN: will send in basket message to social worker.  Will follow up with patient after 7/1 and refer to [facility] case manager. Currently no other issues for me to address at this time. I will wait to provide a warm transfer of care to [facility] case manager.  Tomasa Rand, RN, BSN, CEN [facility] [employer] [phone]

## 2020-05-26 ENCOUNTER — Other Ambulatory Visit: Payer: Self-pay | Admitting: *Deleted

## 2020-05-26 NOTE — Patient Outreach (Signed)
[facility]) Care Management  05/26/2020  [name] [birth year] [id number]   CSW spoke with pt who reports she received the mailed packet of info yesterday.  CSW reviewed the material with her and encouraged her to reach out if additional needs arise. Per pt, she is planning to change her insurance next month; "I am going to be with [employer]'".  CSW advised pt that she will have some new/different benefits through them and will plan to Pt discuss with her once she has transferred.  Pt also denies any transportation needs at this time; stating her next planned visits are in August.  Per pt, her depression is "good" and managed well- she shared that she [facility] and "this helps a lot".  CSW praised pt for finding ways to exercise which helps not only with her health but also her mental health.   Pt also states she got one of her pneumonia shots at PCP visit this week and is going to look at some housing options with her granddaughter later today.   CSW offered support and encouragement and will plan to follow up again in the next month. CSW will update PCP and [facility] team to above and reach out inJuly.   Eduard Clos, MSW, [place] Worker  [place] [phone]

## 2020-05-29 ENCOUNTER — Telehealth: Payer: Self-pay | Admitting: Family Medicine

## 2020-05-29 ENCOUNTER — Encounter: Payer: Self-pay | Admitting: Family Medicine

## 2020-05-29 DIAGNOSIS — B369 Superficial mycosis, unspecified: Secondary | ICD-10-CM | POA: Insufficient documentation

## 2020-05-29 DIAGNOSIS — R269 Unspecified abnormalities of gait and mobility: Secondary | ICD-10-CM | POA: Insufficient documentation

## 2020-05-29 DIAGNOSIS — R634 Abnormal weight loss: Secondary | ICD-10-CM | POA: Insufficient documentation

## 2020-05-29 DIAGNOSIS — R195 Other fecal abnormalities: Secondary | ICD-10-CM | POA: Insufficient documentation

## 2020-05-29 NOTE — Assessment & Plan Note (Signed)
Hyperlipidemia:Low fat diet discussed and encouraged.   Lipid Panel  Lab Results  Component Value Date   CHOL 167 02/26/2020   HDL 52 02/26/2020   LDLCALC 99 02/26/2020   TRIG 69 02/26/2020   CHOLHDL 3.2 02/26/2020   Controlled, no change in medication    

## 2020-05-29 NOTE — Telephone Encounter (Signed)
Pls order straight cane for her, and attempt scale, visit is closed and there is documentation to support both Plslet her knw I recommend appt with nutritionist due to excess weight loss, she is also diabetic, has lost 40 pounds in 1 year, if she agrees then refer. Stress the importance of following through with the colonoscopy please, thanks!

## 2020-05-29 NOTE — Assessment & Plan Note (Signed)
Pt again refuses evaluation of breast despite the fact that malignancy has been diagnosed since 10 years ago

## 2020-05-29 NOTE — Assessment & Plan Note (Signed)
C/o being excessively bent forward  causing difficult ambulation , requests straight cane to  Assist, updated dexa needed also

## 2020-05-29 NOTE — Assessment & Plan Note (Addendum)
Forty pound weight loss in past 1 year, needs GI eval, requesting a scale to monitor, needs referral to nutritionist also

## 2020-05-29 NOTE — Assessment & Plan Note (Signed)
Change in stool caliber, early satiety , unintentional weight loss, needs colonoscopy asap, referred

## 2020-05-29 NOTE — Assessment & Plan Note (Signed)
Clotrimazole/ betamethasone twice daily for 10 days, refer derm if not improved. Shoulder affected, left

## 2020-05-29 NOTE — Assessment & Plan Note (Addendum)
[name] is reminded of the importance of commitment to daily physical activity for 30 minutes or more, as able and the need to limit carbohydrate intake to 30 to 60 grams per meal to help with blood sugar control.   The need to take medication as prescribed, test blood sugar as directed, and to call between visits if there is a concern that blood sugar is uncontrolled is also discussed.   [name] is reminded of the importance of daily foot exam, annual eye examination, and good blood sugar, blood pressure and cholesterol control.  Diabetic Labs Latest Ref Rng & Units 03/07/2020 02/26/2020 12/28/2019 07/24/2019 07/23/2019  HbA1c <5.7 % of total Hgb 7.5(H) - - - -  Microalbumin Not Estab. ug/mL - - - <3.0(H) -  Micro/Creat Ratio 0 - 29 mg/g creat - - - <28 -  Chol <200 mg/dL - 167 - - -  HDL > OR = 50 mg/dL - 52 - - -  Calc LDL mg/dL (calc) - 99 - - -  Triglycerides <150 mg/dL - 69 - - -  Creatinine 0.50 - 0.99 mg/dL - 0.45(L) 0.66 - 0.82   BP/Weight 05/24/2020 05/09/2020 04/14/2020 03/30/2020 03/23/2020 4/45/8483 5/0/7573  Systolic BP 225 672 091 980 221 - 798  Diastolic BP 84 84 84 81 82 - 68  Wt. (Lbs) 117 124 124.4 120 - 124 123.4  BMI 20.73 21.97 22.04 21.26 - 21.97 21.86   Foot/eye exam completion dates Latest Ref Rng & Units 08/19/2019 10/29/2017  Eye Exam No Retinopathy - -  Foot exam Order - - -  Foot Form Completion - Done Done    Improved and treated by Endo

## 2020-05-30 ENCOUNTER — Other Ambulatory Visit: Payer: Self-pay

## 2020-05-30 ENCOUNTER — Encounter (INDEPENDENT_AMBULATORY_CARE_PROVIDER_SITE_OTHER): Payer: Self-pay | Admitting: *Deleted

## 2020-05-30 ENCOUNTER — Ambulatory Visit (INDEPENDENT_AMBULATORY_CARE_PROVIDER_SITE_OTHER): Payer: Medicare Other | Admitting: Gastroenterology

## 2020-05-30 ENCOUNTER — Encounter (INDEPENDENT_AMBULATORY_CARE_PROVIDER_SITE_OTHER): Payer: Self-pay | Admitting: Gastroenterology

## 2020-05-30 ENCOUNTER — Other Ambulatory Visit (INDEPENDENT_AMBULATORY_CARE_PROVIDER_SITE_OTHER): Payer: Self-pay | Admitting: *Deleted

## 2020-05-30 VITALS — BP 125/77 | HR 80 | Temp 99.1°F | Ht 63.0 in | Wt 119.2 lb

## 2020-05-30 DIAGNOSIS — R634 Abnormal weight loss: Secondary | ICD-10-CM

## 2020-05-30 DIAGNOSIS — R269 Unspecified abnormalities of gait and mobility: Secondary | ICD-10-CM

## 2020-05-30 DIAGNOSIS — E1140 Type 2 diabetes mellitus with diabetic neuropathy, unspecified: Secondary | ICD-10-CM

## 2020-05-30 DIAGNOSIS — K5909 Other constipation: Secondary | ICD-10-CM

## 2020-05-30 MED ORDER — UNABLE TO FIND: 0 refills | Status: DC

## 2020-05-30 NOTE — Progress Notes (Addendum)
Patient profile: [name] is a 64 y.o. female seen for follow-up of nausea, abd pain, change in bowel habits, wt loss.  She was last seen by Dr. Laural Golden April 2021.  At that visit was noted to have significant weight loss.  She was recommended to stop all of her supplements.  She also had dysphagia and had an endoscopy April 2020  History of Present Illness: [name] is seen today for follow-up and reports her dysphagia has completely resolved since her endoscopy.  She is no longer having any GERD symptoms. No longer taking protonix.  She tries to chew her foods well but does note some poor dentition.  She has no nausea/vomiting.  She does continue to have significant weight loss, reports she sometimes does not eat because she is busy, she also feels she does not get hungry and forgets.  She denies any specific early satiety or pain with eating.  She has no nausea vomiting.  She does endorse not eating well also due to chronic constipation, is taking Dulcolax which helps.  She reports trying MiraLAX which did not help.  She is also taking a supplement called "herbal day" which helps her stools move.  She denies any blood in stool.  No significant lower abdominal pain.  Reports stools are 1-2 on Bristol stool scale.  Uses Fleet enemas occasionally. No alternating diarrhea. Has tried increasing fluid intake.   She reports having intentional weight loss initially and states she was over 200 pounds in 2018, feels she stopped trying to lose weight around 145 pounds & continued weight loss has been unintentional.  Non-smoker.  Denies alcohol or NSAIDs. Not using mobic recently. Goes to the [facility] and uses the exercise bike.  Wt Readings from Last 3 Encounters:  05/30/20 119 lb 3.2 oz (54.1 kg)  05/24/20 117 lb (53.1 kg)  05/09/20 124 lb (56.2 kg)  03/2020 weight - #123   08/19/2019- weight #156     Last Colonoscopy: Unable to find any on file-Per patient over 10 years ago   Last  Endoscopy: 03/2020-- She had an upper endoscopy in April 2020 with a 2 cm hiatal hernia, empiric dilation 78 French, Gastritis. PATH---Chronic inactive gastritis.  - There is no evidence of Helicobacter pylori, dysplasia, or malignancy    Past Medical History:  Past Medical History:  Diagnosis Date  . Allergic rhinitis   . ALLERGIC RHINITIS, SEASONAL 02/27/2008   Qualifier: Diagnosis of  By: Rebecca Eaton LPN, [place] Cerise    . Anemia, deficiency   . Annual physical exam 08/15/2015  . Diabetes mellitus   . Educated about COVID-19 virus infection 05/01/2019  . GERD 02/27/2008   Qualifier: Diagnosis of  By: Rebecca Eaton LPN, [place] Cerise    . GERD (gastroesophageal reflux disease)   . Hyperlipidemia   . Hypertension   . Obesity   . Post-menopausal bleeding 08/15/2015    Problem List: Patient Active Problem List   Diagnosis Date Noted  . Change in stool caliber 05/29/2020  . Dermatomycosis 05/29/2020  . Weight loss, abnormal 05/29/2020  . Gait disturbance 05/29/2020  . Burning with urination 01/29/2020  . Vitamin D deficiency 01/29/2020  . Cold intolerance 12/24/2019  . Dry skin 12/24/2019  . Constipation 12/24/2019  . Arthritis 12/24/2019  . Uncontrolled type 2 diabetes mellitus with hyperglycemia ([place]) 08/05/2019  . Personal history of noncompliance with medical treatment, presenting hazards to health 08/05/2019  . Acute pain of right shoulder 10/09/2016  . Non compliance with medical treatment 08/15/2015  .  Nail fungus 11/05/2011  . Malignant neoplasm of breast (female) ([place]) 11/28/2010  . Abnormal mammogram 06/12/2010  . Hyperlipidemia 05/22/2010  . Type II diabetes mellitus ([place]) 02/27/2008    Past Surgical History: Past Surgical History:  Procedure Laterality Date  . BIOPSY  03/23/2020   Procedure: BIOPSY;  Surgeon: Rogene Houston, MD;  [facility];  Service: Endoscopy;;  gastric  . ESOPHAGOGASTRODUODENOSCOPY (EGD) WITH PROPOFOL N/A 03/23/2020   Procedure:  ESOPHAGOGASTRODUODENOSCOPY (EGD) WITH PROPOFOL;  Surgeon: Rogene Houston, MD;  [facility];  Service: Endoscopy;  Laterality: N/A;  125  . EXTRACTION OF SEVENTH TEETH  2007  . MALONEY DILATION  03/23/2020   Procedure: MALONEY DILATION;  Surgeon: Rogene Houston, MD;  [facility];  Service: Endoscopy;;  . REMOVAL OF GREAT TOENAIL  2005  . SPLENECTOMY  2002    Allergies: Allergies  Allergen Reactions  . Citrus     In large amounts causes congestion / acid reflux   . Codeine Itching  . Milk-Related Compounds     Constipation   . Onion     gas  . Pantoprazole     Felt like I was going to pass out      Home Medications:  Current Outpatient Medications:  .  acetaminophen (TYLENOL) 650 MG CR tablet, Take 1,300 mg by mouth every 8 (eight) hours as needed for pain., Disp: , Rfl:  .  alum & mag hydroxide-simeth (MAALOX/MYLANTA) 200-200-20 MG/5ML suspension, Take 30 mLs by mouth every 6 (six) hours as needed for indigestion or heartburn., Disp: , Rfl:  .  docusate sodium (COLACE) 100 MG capsule, Take 100 mg by mouth at bedtime., Disp: , Rfl:  .  ECHINACEA PO, Take 760 mg by mouth daily., Disp: , Rfl:  .  Fe Bisgly-Vit C-Vit B12-FA (GENTLE IRON PO), Take by mouth. Patient takes 25 mg - patient states that she just started taking 1-3 times a week., Disp: , Rfl:  .  feeding supplement, GLUCERNA SHAKE, (GLUCERNA SHAKE) LIQD, Take 237 mLs by mouth daily., Disp: , Rfl:  .  Garlic 8341 MG CAPS, Take 1,000 mg by mouth at bedtime., Disp: , Rfl:  .  glipiZIDE (GLUCOTROL XL) 10 MG 24 hr tablet, Take 1 tablet (10 mg total) by mouth daily with breakfast., Disp: 90 tablet, Rfl: 2 .  glucose blood (ACCU-CHEK GUIDE) test strip, USE 1 STRIP TO CHECK GLUCOSE TWICE DAILY, Disp: 200 each, Rfl: 0 .  Lancets MISC, 1 each by Does not apply route as directed., Disp: 100 each, Rfl: 3 .  Multiple Vitamins-Minerals (MULTIPLE VITAMINS/WOMENS) tablet, Take 1 tablet by mouth daily.  , Disp: ,  Rfl:  .  Naphazoline-Glycerin (REDNESS RELIEF OP), Place 1 drop into both eyes daily as needed (redness)., Disp: , Rfl:  .  Omega-3 Fatty Acids (FISH OIL PO), Take 1,000 mg by mouth daily., Disp: , Rfl:  .  ramipril (ALTACE) 2.5 MG capsule, Take 1 capsule by mouth once daily, Disp: 90 capsule, Rfl: 0 .  rosuvastatin (CRESTOR) 5 MG tablet, Take 1 tablet (5 mg total) by mouth at bedtime., Disp: 30 tablet, Rfl: 2 .  UNABLE TO FIND, Posture Cane x 1  DX R26.81, Disp: 1 each, Rfl: 0 .  Cholecalciferol (DIALYVITE VITAMIN D 5000) 125 MCG (5000 UT) capsule, Take 5,000 Units by mouth daily. (Patient not taking: Reported on 05/30/2020), Disp: , Rfl:  .  clotrimazole-betamethasone (LOTRISONE) cream, Apply twice daily to rash on left shoulder for 10  to 14 days, then as needed (Patient not taking: Reported on 05/30/2020), Disp: 45 g, Rfl: 0 .  COLOSTRUM PO, Take 3 tablets by mouth daily before breakfast. (Patient not taking: Reported on 05/30/2020), Disp: , Rfl:  .  meloxicam (MOBIC) 7.5 MG tablet, Take 1 tablet (7.5 mg total) by mouth daily. (Patient not taking: Reported on 05/24/2020), Disp: 30 tablet, Rfl: 5 .  pantoprazole (PROTONIX) 40 MG tablet, Take 1 tablet (40 mg total) by mouth daily before breakfast. (Patient not taking: Reported on 05/24/2020), Disp: 30 tablet, Rfl: 5 .  Probiotic Product (PROBIOTIC DAILY PO), Take 1 capsule by mouth at bedtime.  (Patient not taking: Reported on 05/30/2020), Disp: , Rfl:  .  UNABLE TO FIND, Digital scales x 1 (Patient not taking: Reported on 05/30/2020), Disp: 1 each, Rfl: 0 .  UNABLE TO FIND, Bathroom scale weigh once daily (Patient not taking: Reported on 05/30/2020), Disp: 1 each, Rfl: 0   Family History: family history includes Arthritis in an other family member; Asthma in an other family member; Cancer in an other family member; Diabetes in her mother; Hyperlipidemia in her mother and sister; Hypertension in her brother, mother, and sister; Lung disease in her father;  Sickle cell trait in her son.    Social History:   reports that she has never smoked. She has never used smokeless tobacco. She reports that she does not drink alcohol and does not use drugs.   Review of Systems: Constitutional:+weight loss   Eyes: No changes in vision. ENT: No oral lesions, sore throat.  GI: see HPI.  Heme/Lymph: No easy bruising.  CV: No chest pain.  GU: No hematuria.  Integumentary: No rashes.  Neuro: No headaches.  Psych: No depression/anxiety.  Endocrine: No heat/cold intolerance.  Allergic/Immunologic: No urticaria.  Resp: No cough, SOB.  Musculoskeletal: No joint swelling.    Physical Examination: BP 125/77 (BP Location: Right Arm, Patient Position: Sitting, Cuff Size: Normal)   Pulse 80   Temp 99.1 F (37.3 C) (Oral)   Ht 5\' 3"  (1.6 m)   Wt 119 lb 3.2 oz (54.1 kg)   BMI 21.12 kg/m  Gen: NAD, alert and oriented x 4 HEENT: PEERLA, EOMI, Neck: supple, no JVD Chest: CTA bilaterally, no wheezes, crackles, or other adventitious sounds CV: RRR, no m/g/c/r Abd: soft, NT, ND, +BS in all four quadrants; no HSM, guarding, ridigity, or rebound tenderness Ext: no edema, well perfused with 2+ pulses, Skin: no rash or lesions noted on observed skin Lymph: no noted LAD  Data Reviewed:   Labs 03/2020-A1c 7.5, TSH normal.  02/2020-glucose 190, otherwise CMP normal, vitamin D normal, CBC normal.    Assessment/Plan: Ms. Molyneux is a 64 y.o. female seen with continued weight loss and constipation.  Needs colonoscopy for further evaluation.  She will try increasing stool softener from once a day to twice a day.  Did review needs to be moving stools regularly prior to bowel prep to decrease risk for prep.  If colonoscopy unremarkable consider imaging for further evaluation of weight loss.  2.  Dysphagia-resolved since upper endoscopy.  To notify if returns.   Keyarra was seen today for follow-up.  Diagnoses and all orders for this visit:  Chronic  constipation  Loss of weight    Patient denies CP, SOB, and use of blood thinners. I discussed the risks and benefits of procedure including bleeding, perforation, infection, missed lesions, medication reactions and possible hospitalization or surgery if complications. All questions answered. Denies prior issues w/  sedation.    I personally performed the service, non-incident to. (WP)  Laurine Blazer, [facility]

## 2020-05-30 NOTE — Telephone Encounter (Signed)
Cane and scale ordered for patient and sent to [facility]. Order entered for nutritionist. Patient aware that order will be placed and someone will call her for an appointment.

## 2020-05-30 NOTE — Progress Notes (Signed)
amb ref °

## 2020-05-30 NOTE — Patient Instructions (Signed)
You can increase stool softener to twice a day for constipation. We are setting up a colonoscopy

## 2020-06-01 ENCOUNTER — Ambulatory Visit: Payer: Self-pay | Admitting: *Deleted

## 2020-06-03 ENCOUNTER — Other Ambulatory Visit: Payer: Self-pay

## 2020-06-03 NOTE — Patient Outreach (Signed)
[name] [facility]) Care Management  06/03/2020  [name] 02-24-1956 [id number]    Placed call to patient who reports she has followed up with MD and equipment was ordered.  Reports she is waiting to confirm her change in insurance and will attempt to get her equipment.  Patient reports she will all me back and let me know when she has confirmed her change in insurance.   PLAN:after confirmation of changed insurance will transfer to [facility] case Freight forwarder.  Tomasa Rand, RN, BSN, CEN [facility] [employer] [phone]

## 2020-06-07 ENCOUNTER — Encounter: Payer: Self-pay | Admitting: Family Medicine

## 2020-06-08 ENCOUNTER — Other Ambulatory Visit (HOSPITAL_COMMUNITY): Payer: Medicare Other

## 2020-06-08 ENCOUNTER — Other Ambulatory Visit: Payer: Self-pay

## 2020-06-08 ENCOUNTER — Other Ambulatory Visit: Payer: Self-pay | Admitting: *Deleted

## 2020-06-08 ENCOUNTER — Ambulatory Visit: Payer: Self-pay | Admitting: *Deleted

## 2020-06-08 NOTE — Patient Outreach (Signed)
[place] [facility]) Care Management  06/08/2020  CHERON CORYELL [birth year] [id number]   CSW attempted to reach pt by phone today and was unable to reach as well as voice mail was full.  CSW will attempt outreach again in 3-4 business days per policy.   Eduard Clos, MSW, [place] Worker  [place] [phone]

## 2020-06-10 NOTE — Patient Outreach (Signed)
[place] [facility]) Care Management  06/10/2020  [name] 06-27-1956 [id number]    Telephone assessment:  Placed all to patient today with no answer.   No machine.   PLAN: will attempt again in 3 days.  Tomasa Rand, RN, BSN, CEN [facility] [employer] [phone]

## 2020-06-13 ENCOUNTER — Ambulatory Visit: Payer: Self-pay | Admitting: *Deleted

## 2020-06-14 ENCOUNTER — Other Ambulatory Visit: Payer: Self-pay | Admitting: *Deleted

## 2020-06-14 ENCOUNTER — Other Ambulatory Visit: Payer: Self-pay

## 2020-06-14 ENCOUNTER — Other Ambulatory Visit (HOSPITAL_COMMUNITY)
Admission: RE | Admit: 2020-06-14 | Discharge: 2020-06-14 | Disposition: A | Discharge status: Home / Self Care | Payer: Medicare Other | Source: Ambulatory Visit | Attending: Internal Medicine | Admitting: Internal Medicine

## 2020-06-14 DIAGNOSIS — Z01812 Encounter for preprocedural laboratory examination: Secondary | ICD-10-CM | POA: Insufficient documentation

## 2020-06-14 DIAGNOSIS — Z20822 Contact with and (suspected) exposure to covid-19: Secondary | ICD-10-CM | POA: Insufficient documentation

## 2020-06-14 NOTE — Patient Outreach (Signed)
[place] [facility]) Care Management  06/14/2020  SUMAIYA ARRUDA 12-31-1955 [id number]   CSW attempted to reach pt by phone on 06/13/2020 and voicemail was full/no answer.  CSW will attempt again per policy of 3 call attempts in 10 business days. CSW will also send pt an Unsuccessful Economist.   Eduard Clos, MSW, [place] Worker  [place] [phone]

## 2020-06-15 ENCOUNTER — Other Ambulatory Visit (INDEPENDENT_AMBULATORY_CARE_PROVIDER_SITE_OTHER): Payer: Self-pay | Admitting: *Deleted

## 2020-06-15 ENCOUNTER — Other Ambulatory Visit: Payer: Self-pay

## 2020-06-15 LAB — SARS CORONAVIRUS 2 (TAT 6-24 HRS): SARS Coronavirus 2: NEGATIVE

## 2020-06-16 ENCOUNTER — Encounter (HOSPITAL_COMMUNITY)
Admission: RE | Disposition: A | Discharge status: Home / Self Care | Payer: Self-pay | Source: Home / Self Care | Attending: Internal Medicine

## 2020-06-16 ENCOUNTER — Ambulatory Visit (HOSPITAL_COMMUNITY)
Admission: RE | Admit: 2020-06-16 | Discharge: 2020-06-16 | Disposition: A | Discharge status: Home / Self Care | Payer: Medicare Other | Attending: Internal Medicine | Admitting: Internal Medicine

## 2020-06-16 ENCOUNTER — Other Ambulatory Visit: Payer: Self-pay

## 2020-06-16 ENCOUNTER — Encounter (HOSPITAL_COMMUNITY): Payer: Self-pay | Admitting: Internal Medicine

## 2020-06-16 ENCOUNTER — Telehealth: Payer: Self-pay

## 2020-06-16 DIAGNOSIS — K644 Residual hemorrhoidal skin tags: Secondary | ICD-10-CM | POA: Insufficient documentation

## 2020-06-16 DIAGNOSIS — D539 Nutritional anemia, unspecified: Secondary | ICD-10-CM | POA: Insufficient documentation

## 2020-06-16 DIAGNOSIS — Z7984 Long term (current) use of oral hypoglycemic drugs: Secondary | ICD-10-CM | POA: Insufficient documentation

## 2020-06-16 DIAGNOSIS — K219 Gastro-esophageal reflux disease without esophagitis: Secondary | ICD-10-CM | POA: Diagnosis not present

## 2020-06-16 DIAGNOSIS — K648 Other hemorrhoids: Secondary | ICD-10-CM | POA: Diagnosis not present

## 2020-06-16 DIAGNOSIS — K635 Polyp of colon: Secondary | ICD-10-CM | POA: Insufficient documentation

## 2020-06-16 DIAGNOSIS — D120 Benign neoplasm of cecum: Secondary | ICD-10-CM | POA: Diagnosis not present

## 2020-06-16 DIAGNOSIS — K5900 Constipation, unspecified: Secondary | ICD-10-CM | POA: Insufficient documentation

## 2020-06-16 DIAGNOSIS — E785 Hyperlipidemia, unspecified: Secondary | ICD-10-CM | POA: Diagnosis not present

## 2020-06-16 DIAGNOSIS — E119 Type 2 diabetes mellitus without complications: Secondary | ICD-10-CM | POA: Insufficient documentation

## 2020-06-16 DIAGNOSIS — Z79899 Other long term (current) drug therapy: Secondary | ICD-10-CM | POA: Diagnosis not present

## 2020-06-16 DIAGNOSIS — I10 Essential (primary) hypertension: Secondary | ICD-10-CM | POA: Insufficient documentation

## 2020-06-16 DIAGNOSIS — R634 Abnormal weight loss: Secondary | ICD-10-CM

## 2020-06-16 DIAGNOSIS — E669 Obesity, unspecified: Secondary | ICD-10-CM | POA: Diagnosis not present

## 2020-06-16 DIAGNOSIS — K5909 Other constipation: Secondary | ICD-10-CM

## 2020-06-16 HISTORY — PX: POLYPECTOMY: SHX5525

## 2020-06-16 HISTORY — PX: COLONOSCOPY: SHX5424

## 2020-06-16 LAB — GLUCOSE, CAPILLARY: Glucose-Capillary: 161 mg/dL — ABNORMAL HIGH (ref 70–99)

## 2020-06-16 SURGERY — COLONOSCOPY
Anesthesia: Moderate Sedation

## 2020-06-16 MED ORDER — MEPERIDINE HCL 50 MG/ML IJ SOLN
INTRAMUSCULAR | Status: DC | PRN
  Administered 2020-06-16 (×2): 25 mg via INTRAVENOUS

## 2020-06-16 MED ORDER — MIDAZOLAM HCL 5 MG/5ML IJ SOLN
INTRAMUSCULAR | Status: DC | PRN
  Administered 2020-06-16 (×2): 2 mg via INTRAVENOUS

## 2020-06-16 MED ORDER — MEPERIDINE HCL 50 MG/ML IJ SOLN
INTRAMUSCULAR | Status: AC
  Filled 2020-06-16: qty 1

## 2020-06-16 MED ORDER — SODIUM CHLORIDE 0.9 % IV SOLN: INTRAVENOUS | Status: DC

## 2020-06-16 MED ORDER — STERILE WATER FOR IRRIGATION IR SOLN
Status: DC | PRN
  Administered 2020-06-16: 1.5 mL

## 2020-06-16 MED ORDER — MIDAZOLAM HCL 5 MG/5ML IJ SOLN
INTRAMUSCULAR | Status: AC
  Filled 2020-06-16: qty 10

## 2020-06-16 NOTE — Telephone Encounter (Signed)
Received a called from [name]. Pt will stop by the office in 1 month for a weight check per RMR.

## 2020-06-16 NOTE — H&P (Signed)
[name] is an 64 y.o. female.   Chief Complaint: Patient is here for colonoscopy. HPI: Patient is 64 year old African-American female who is here for diagnostic colonoscopy.  Patient states she has noted change in her bowel habits since November last year when she became impacted.  She is not taking stool softener and laxative.  She denies abdominal pain melena or rectal bleeding.  She has been losing weight over the last several months.  She has lost at least 30 pounds in the last 8 or 9 months.  She says her appetite is not good.  She states that she was diagnosed with diabetes mellitus and change her eating habits which may account for some of the weight loss. She had normal TSH in April 2021. Patient's last colonoscopy was over 10 years ago and was normal. Family history is negative for CRC. She does not take aspirin or NSAIDs.  Past Medical History:  Diagnosis Date  . Allergic rhinitis   . ALLERGIC RHINITIS, SEASONAL 02/27/2008   Qualifier: Diagnosis of  By: Rebecca Eaton LPN, [place] Cerise    . Anemia, deficiency   . Annual physical exam 08/15/2015  . Diabetes mellitus   . Educated about COVID-19 virus infection 05/01/2019  . GERD 02/27/2008   Qualifier: Diagnosis of  By: Rebecca Eaton LPN, [place] Cerise    . GERD (gastroesophageal reflux disease)   . Hyperlipidemia   . Hypertension   . Obesity   . Post-menopausal bleeding 08/15/2015    Past Surgical History:  Procedure Laterality Date  . BIOPSY  03/23/2020   Procedure: BIOPSY;  Surgeon: Rogene Houston, MD;  [facility];  Service: Endoscopy;;  gastric  . ESOPHAGOGASTRODUODENOSCOPY (EGD) WITH PROPOFOL N/A 03/23/2020   Procedure: ESOPHAGOGASTRODUODENOSCOPY (EGD) WITH PROPOFOL;  Surgeon: Rogene Houston, MD;  [facility];  Service: Endoscopy;  Laterality: N/A;  125  . EXTRACTION OF SEVENTH TEETH  2007  . MALONEY DILATION  03/23/2020   Procedure: MALONEY DILATION;  Surgeon: Rogene Houston, MD;  [facility];  Service:  Endoscopy;;  . REMOVAL OF GREAT TOENAIL  2005  . SPLENECTOMY  2002    Family History  Problem Relation Age of Onset  . Diabetes Mother   . Hypertension Mother   . Hyperlipidemia Mother   . Lung disease Father   . Hypertension Sister   . Hyperlipidemia Sister   . Hypertension Brother   . Sickle cell trait Son   . Arthritis Other   . Asthma Other   . Cancer Other    Social History:  reports that she has never smoked. She has never used smokeless tobacco. She reports that she does not drink alcohol and does not use drugs.  Allergies:  Allergies  Allergen Reactions  . Citrus     In large amounts causes congestion / acid reflux   . Codeine Itching  . Milk-Related Compounds Other (See Comments)    Constipation   . Onion     gas  . Pantoprazole     Felt like I was going to pass out    Medications Prior to Admission  Medication Sig Dispense Refill  . acetaminophen (TYLENOL) 650 MG CR tablet Take 1,300 mg by mouth every 8 (eight) hours as needed for pain.    Marland Kitchen alum & mag hydroxide-simeth (MAALOX/MYLANTA) 200-200-20 MG/5ML suspension Take 30 mLs by mouth every 6 (six) hours as needed for indigestion or heartburn.    . bisacodyl (DULCOLAX) 5 MG EC tablet Take 5 mg by  mouth at bedtime.    . Calcium Carb-Cholecalciferol (CALTRATE 600+D3 PO) Take 1 tablet by mouth in the morning and at bedtime.    . calcium carbonate (TUMS - DOSED IN MG ELEMENTAL CALCIUM) 500 MG chewable tablet Chew 2 tablets by mouth 3 (three) times daily as needed for indigestion or heartburn.    . cholecalciferol (QC VITAMIN D3) 10 MCG (400 UNIT) TABS tablet Take 400 Units by mouth daily.    . clotrimazole-betamethasone (LOTRISONE) cream Apply twice daily to rash on left shoulder for 10 to 14 days, then as needed (Patient taking differently: Apply 1 application topically at bedtime. ) 45 g 0  . COLOSTRUM PO Take 3 tablets by mouth daily before breakfast. Colostrum Plus Symbiotics    . ECHINACEA PO Take 760 mg by  mouth daily.    . Ensure Plus (ENSURE PLUS) LIQD Take 237 mLs by mouth in the morning and at bedtime.    . Ferrous Sulfate (IRON PO) Take 25 mg by mouth 3 (three) times a week.    . Garlic 4196 MG CAPS Take 1,000 mg by mouth at bedtime.    Marland Kitchen glipiZIDE (GLUCOTROL XL) 10 MG 24 hr tablet Take 1 tablet (10 mg total) by mouth daily with breakfast. 90 tablet 2  . glucose blood (ACCU-CHEK GUIDE) test strip USE 1 STRIP TO CHECK GLUCOSE TWICE DAILY 200 each 0  . Lancets MISC 1 each by Does not apply route as directed. 100 each 3  . Multiple Vitamin (MULTIVITAMIN WITH MINERALS) TABS tablet Take 1 tablet by mouth daily.    . Naphazoline-Glycerin (REDNESS RELIEF OP) Place 1 drop into both eyes 3 (three) times daily as needed (redness).     . Omega-3 Fatty Acids (FISH OIL) 1000 MG CAPS Take 1,000 mg by mouth daily.    . ramipril (ALTACE) 2.5 MG capsule Take 1 capsule by mouth once daily (Patient taking differently: Take 2.5 mg by mouth daily after breakfast. ) 90 capsule 0  . senna (SENOKOT) 8.6 MG tablet Take 2 tablets by mouth at bedtime.    Marland Kitchen UNABLE TO FIND Posture Cane x 1  DX R26.81 1 each 0  .    5  . pantoprazole (PROTONIX) 40 MG tablet Take 1 tablet (40 mg total) by mouth daily before breakfast. (Patient not taking: Reported on 05/24/2020) 30 tablet 5  . rosuvastatin (CRESTOR) 5 MG tablet Take 1 tablet (5 mg total) by mouth at bedtime. (Patient not taking: Reported on 06/08/2020) 30 tablet 2  . UNABLE TO FIND Digital scales x 1 (Patient not taking: Reported on 05/30/2020) 1 each 0  . UNABLE TO FIND Bathroom scale weigh once daily (Patient not taking: Reported on 05/30/2020) 1 each 0    Results for orders placed or performed during the hospital encounter of 06/16/20 (from the past 48 hour(s))  Glucose, capillary     Status: Abnormal   Collection Time: 06/16/20  1:00 PM  Result Value Ref Range   Glucose-Capillary 161 (H) 70 - 99 mg/dL    Comment: Glucose reference range applies only to samples taken  after fasting for at least 8 hours.   No results found.  Review of Systems  Blood pressure 124/84, pulse 76, temperature 98.6 F (37 C), temperature source Oral, resp. rate 16, height 5\' 4"  (1.626 m), SpO2 100 %. Physical Exam HENT:     Mouth/Throat:     Mouth: Mucous membranes are moist.     Pharynx: Oropharynx is clear.  Eyes:  General: No scleral icterus.    Conjunctiva/sclera: Conjunctivae normal.  Cardiovascular:     Rate and Rhythm: Normal rate and regular rhythm.     Heart sounds: Normal heart sounds. No murmur heard.   Pulmonary:     Effort: Pulmonary effort is normal.     Breath sounds: Normal breath sounds.  Abdominal:     Comments: Abdomen is symmetrical.  On palpation is soft and nontender with organomegaly or masses.  Musculoskeletal:        General: No swelling.     Cervical back: Neck supple.  Lymphadenopathy:     Cervical: No cervical adenopathy.  Skin:    General: Skin is warm and dry.  Neurological:     Mental Status: She is alert.      Assessment/Plan  New onset of constipation. Weight loss appears to be multifactorial. Diagnostic colonoscopy.  Hildred Laser, MD 06/16/2020, 1:17 PM

## 2020-06-16 NOTE — Patient Outreach (Signed)
[place] [facility]) Care Management  06/16/2020  PHILAMENA KRAMAR 05/20/1956 [id number]   Telephone assessment:  Placed call to patient to inquire about change of insurance. Patient reports that she was told he only had medicaid.  Reviewed with patient to contact [email or web address] to inquire about her insurance . Also provided patient with contact for [facility] and their contact number.  [phone]  Reviewed CBG level good at about 100 per day.  PLAN: will plan follow up call with patient in 1 week. Will inquire about insurance from Care Management Assistants.   Tomasa Rand, RN, BSN, CEN [facility] [employer] [phone]

## 2020-06-16 NOTE — Op Note (Signed)
[facility] Patient Name: [name] Procedure Date: 06/16/2020 1:21 PM MRN: [id number] Date of Birth: 1956/05/16 Attending MD: Hildred Laser , MD CSN: [id number] Age: 64 Admit Type: Outpatient Procedure:                Colonoscopy Indications:              Constipation, Weight loss Providers:                Hildred Laser, MD, [place]is Peak B. Sharon Seller, RN, Nelma Rothman, Technician Referring MD:             Norwood Levo. Moshe Cipro, MD Medicines:                Meperidine 50 mg IV, Midazolam 4 mg IV Complications:            No immediate complications. Estimated Blood Loss:     Estimated blood loss was minimal. Procedure:                Pre-Anesthesia Assessment:                           - Prior to the procedure, a History and Physical                            was performed, and patient medications and                            allergies were reviewed. The patient's tolerance of                            previous anesthesia was also reviewed. The risks                            and benefits of the procedure and the sedation                            options and risks were discussed with the patient.                            All questions were answered, and informed consent                            was obtained. Prior Anticoagulants: The patient has                            taken no previous anticoagulant or antiplatelet                            agents. ASA Grade Assessment: II - A patient with                            mild systemic disease. After reviewing the risks  and benefits, the patient was deemed in                            satisfactory condition to undergo the procedure.                           After obtaining informed consent, the colonoscope                            was passed under direct vision. Throughout the                            procedure, the patient's blood pressure, pulse, and                             oxygen saturations were monitored continuously. The                            [id number]-[id number] ([id number]) scope was introduced through                            the anus and advanced to the the terminal ileum,                            with identification of the appendiceal orifice and                            IC valve. The colonoscopy was performed without                            difficulty. The patient tolerated the procedure                            well. The quality of the bowel preparation was                            excellent. Scope In: 1:29:10 PM Scope Out: 1:46:34 PM Scope Withdrawal Time: 0 hours 10 minutes 51 seconds  Total Procedure Duration: 0 hours 17 minutes 24 seconds  Findings:      Skin tags were found on perianal exam.      The terminal ileum appeared normal.      A diminutive polyp was found in the cecum. Biopsies were taken with a       cold forceps for histology.      The exam was otherwise normal throughout the examined colon.      External and internal hemorrhoids were found during retroflexion. The       hemorrhoids were small. Impression:               - Perianal skin tags found on perianal exam.                           - The examined portion of the ileum was normal.                           -  One diminutive polyp in the cecum. Biopsied.                           - External and internal hemorrhoids. Moderate Sedation:      Moderate (conscious) sedation was administered by the endoscopy nurse       and supervised by the endoscopist. The following parameters were       monitored: oxygen saturation, heart rate, blood pressure, CO2       capnography and response to care. Total physician intraservice time was       23 minutes. Recommendation:           - Patient has a contact number available for                            emergencies. The signs and symptoms of potential                            delayed complications were discussed with the                             patient. Return to normal activities tomorrow.                            Written discharge instructions were provided to the                            patient.                           - High fiber diet and diabetic (ADA) diet today.                           - Continue present medications.                           - Await pathology results.                           - No aspirin, ibuprofen, naproxen, or other                            non-steroidal anti-inflammatory drugs for 1 day.                           - Await pathology results.                           - Repeat colonoscopy is recommended. The                            colonoscopy date will be determined after pathology                            results from today's exam become available for  review. Procedure Code(s):        --- Professional ---                           [phone], Colonoscopy, flexible; with biopsy, single                            or multiple                           99153, Moderate sedation; each additional 15                            minutes intraservice time                           G0500, Moderate sedation services provided by the                            same physician or other qualified health care                            professional performing a gastrointestinal                            endoscopic service that sedation supports,                            requiring the presence of an independent trained                            observer to assist in the monitoring of the                            patient's level of consciousness and physiological                            status; initial 15 minutes of intra-service time;                            patient age 73 years or older (additional time may                            be reported with [phone], as appropriate) Diagnosis Code(s):        --- Professional ---                            K64.8, Other hemorrhoids                           K63.5, Polyp of colon                           K64.4, Residual hemorrhoidal skin tags  K59.00, Constipation, unspecified                           R63.4, Abnormal weight loss CPT copyright 2019 American Medical Association. All rights reserved. The codes documented in this report are preliminary and upon coder review may  be revised to meet current compliance requirements. Hildred Laser, MD Hildred Laser, MD 06/16/2020 1:55:35 PM This report has been signed electronically. Number of Addenda: 0

## 2020-06-16 NOTE — Discharge Instructions (Signed)
No aspirin or NSAIDs for 24 hours. Resume usual medications as before. Modified carb high-fiber diet. No driving for 24 hours. Physician will call with biopsy results. Weight check in 1 month.   Colonoscopy, Adult, Care After This sheet gives you information about how to care for yourself after your procedure. Your doctor may also give you more specific instructions. If you have problems or questions, call your doctor. What can I expect after the procedure? After the procedure, it is common to have:  A small amount of blood in your poop (stool) for 24 hours.  Some gas.  Mild cramping or bloating in your belly (abdomen). Follow these instructions at home: Eating and drinking   Drink enough fluid to keep your pee (urine) pale yellow.  Follow instructions from your doctor about what you cannot eat or drink.  Return to your normal diet as told by your doctor. Avoid heavy or fried foods that are hard to digest. Activity  Rest as told by your doctor.  Do not sit for a long time without moving. Get up to take short walks every 1-2 hours. This is important. Ask for help if you feel weak or unsteady.  Return to your normal activities as told by your doctor. Ask your doctor what activities are safe for you. To help cramping and bloating:   Try walking around.  Put heat on your belly as told by your doctor. Use the heat source that your doctor recommends, such as a moist heat pack or a heating pad. ? Put a towel between your skin and the heat source. ? Leave the heat on for 20-30 minutes. ? Remove the heat if your skin turns bright red. This is very important if you are unable to feel pain, heat, or cold. You may have a greater risk of getting burned. General instructions  For the first 24 hours after the procedure: ? Do not drive or use machinery. ? Do not sign important documents. ? Do not drink alcohol. ? Do your daily activities more slowly than normal. ? Eat foods that are  soft and easy to digest.  Take over-the-counter or prescription medicines only as told by your doctor.  Keep all follow-up visits as told by your doctor. This is important. Contact a doctor if:  You have blood in your poop 2-3 days after the procedure. Get help right away if:  You have more than a small amount of blood in your poop.  You see large clumps of tissue (blood clots) in your poop.  Your belly is swollen.  You feel like you may vomit (nauseous).  You vomit.  You have a fever.  You have belly pain that gets worse, and medicine does not help your pain. Summary  After the procedure, it is common to have a small amount of blood in your poop. You may also have mild cramping and bloating in your belly.  For the first 24 hours after the procedure, do not drive or use machinery, do not sign important documents, and do not drink alcohol.  Get help right away if you have a lot of blood in your poop, feel like you may vomit, have a fever, or have more belly pain. This information is not intended to replace advice given to you by your health care provider. Make sure you discuss any questions you have with your health care provider. Document Revised: 06/15/2019 Document Reviewed: 06/15/2019 Elsevier Patient Education  [place].   Colon Polyps  Polyps  are tissue growths inside the body. Polyps can grow in many places, including the large intestine (colon). A polyp may be a round bump or a mushroom-shaped growth. You could have one polyp or several. Most colon polyps are noncancerous (benign). However, some colon polyps can become cancerous over time. Finding and removing the polyps early can help prevent this. What are the causes? The exact cause of colon polyps is not known. What increases the risk? You are more likely to develop this condition if you:  Have a family history of colon cancer or colon polyps.  Are older than 48 or older than 45 if you are African  American.  Have inflammatory bowel disease, such as ulcerative colitis or Crohn's disease.  Have certain hereditary conditions, such as: ? Familial adenomatous polyposis. ? Lynch syndrome. ? Turcot syndrome. ? Peutz-Jeghers syndrome.  Are overweight.  Smoke cigarettes.  Do not get enough exercise.  Drink too much alcohol.  Eat a diet that is high in fat and red meat and low in fiber.  Had childhood cancer that was treated with abdominal radiation. What are the signs or symptoms? Most polyps do not cause symptoms. If you have symptoms, they may include:  Blood coming from your rectum when having a bowel movement.  Blood in your stool. The stool may look dark red or black.  Abdominal pain.  A change in bowel habits, such as constipation or diarrhea. How is this diagnosed? This condition is diagnosed with a colonoscopy. This is a procedure in which a lighted, flexible scope is inserted into the anus and then passed into the colon to examine the area. Polyps are sometimes found when a colonoscopy is done as part of routine cancer screening tests. How is this treated? Treatment for this condition involves removing any polyps that are found. Most polyps can be removed during a colonoscopy. Those polyps will then be tested for cancer. Additional treatment may be needed depending on the results of testing. Follow these instructions at home: Lifestyle  Maintain a healthy weight, or lose weight if recommended by your health care provider.  Exercise every day or as told by your health care provider.  Do not use any products that contain nicotine or tobacco, such as cigarettes and e-cigarettes. If you need help quitting, ask your health care provider.  If you drink alcohol, limit how much you have: ? 0-1 drink a day for women. ? 0-2 drinks a day for men.  Be aware of how much alcohol is in your drink. In the [place] one drink equals one 12 oz bottle of beer (355 mL), one 5 oz glass  of wine (148 mL), or one 1 oz shot of hard liquor (44 mL). Eating and drinking   Eat foods that are high in fiber, such as fruits, vegetables, and whole grains.  Eat foods that are high in calcium and vitamin D, such as milk, cheese, yogurt, eggs, liver, fish, and broccoli.  Limit foods that are high in fat, such as fried foods and desserts.  Limit the amount of red meat and processed meat you eat, such as hot dogs, sausage, bacon, and lunch meats. General instructions  Keep all follow-up visits as told by your health care provider. This is important. ? This includes having regularly scheduled colonoscopies. ? Talk to your health care provider about when you need a colonoscopy. Contact a health care provider if:  You have new or worsening bleeding during a bowel movement.  You have new or  increased blood in your stool.  You have a change in bowel habits.  You lose weight for no known reason. Summary  Polyps are tissue growths inside the body. Polyps can grow in many places, including the colon.  Most colon polyps are noncancerous (benign), but some can become cancerous over time.  This condition is diagnosed with a colonoscopy.  Treatment for this condition involves removing any polyps that are found. Most polyps can be removed during a colonoscopy. This information is not intended to replace advice given to you by your health care provider. Make sure you discuss any questions you have with your health care provider. Document Revised: 03/06/2018 Document Reviewed: 03/06/2018 Elsevier Patient Education  [place].   Hemorrhoids Hemorrhoids are swollen veins in and around the rectum or anus. There are two types of hemorrhoids:  Internal hemorrhoids. These occur in the veins that are just inside the rectum. They may poke through to the outside and become irritated and painful.  External hemorrhoids. These occur in the veins that are outside the anus and can be felt  as a painful swelling or hard lump near the anus. Most hemorrhoids do not cause serious problems, and they can be managed with home treatments such as diet and lifestyle changes. If home treatments do not help the symptoms, procedures can be done to shrink or remove the hemorrhoids. What are the causes? This condition is caused by increased pressure in the anal area. This pressure may result from various things, including:  Constipation.  Straining to have a bowel movement.  Diarrhea.  Pregnancy.  Obesity.  Sitting for long periods of time.  Heavy lifting or other activity that causes you to strain.  Anal sex.  Riding a bike for a long period of time. What are the signs or symptoms? Symptoms of this condition include:  Pain.  Anal itching or irritation.  Rectal bleeding.  Leakage of stool (feces).  Anal swelling.  One or more lumps around the anus. How is this diagnosed? This condition can often be diagnosed through a visual exam. Other exams or tests may also be done, such as:  An exam that involves feeling the rectal area with a gloved hand (digital rectal exam).  An exam of the anal canal that is done using a small tube (anoscope).  A blood test, if you have lost a significant amount of blood.  A test to look inside the colon using a flexible tube with a camera on the end (sigmoidoscopy or colonoscopy). How is this treated? This condition can usually be treated at home. However, various procedures may be done if dietary changes, lifestyle changes, and other home treatments do not help your symptoms. These procedures can help make the hemorrhoids smaller or remove them completely. Some of these procedures involve surgery, and others do not. Common procedures include:  Rubber band ligation. Rubber bands are placed at the base of the hemorrhoids to cut off their blood supply.  Sclerotherapy. Medicine is injected into the hemorrhoids to shrink them.  Infrared  coagulation. A type of light energy is used to get rid of the hemorrhoids.  Hemorrhoidectomy surgery. The hemorrhoids are surgically removed, and the veins that supply them are tied off.  Stapled hemorrhoidopexy surgery. The surgeon staples the base of the hemorrhoid to the rectal wall. Follow these instructions at home: Eating and drinking   Eat foods that have a lot of fiber in them, such as whole grains, beans, nuts, fruits, and vegetables.  Ask  your health care provider about taking products that have added fiber (fiber supplements).  Reduce the amount of fat in your diet. You can do this by eating low-fat dairy products, eating less red meat, and avoiding processed foods.  Drink enough fluid to keep your urine pale yellow. Managing pain and swelling   Take warm sitz baths for 20 minutes, 3-4 times a day to ease pain and discomfort. You may do this in a bathtub or using a portable sitz bath that fits over the toilet.  If directed, apply ice to the affected area. Using ice packs between sitz baths may be helpful. ? Put ice in a plastic bag. ? Place a towel between your skin and the bag. ? Leave the ice on for 20 minutes, 2-3 times a day. General instructions  Take over-the-counter and prescription medicines only as told by your health care provider.  Use medicated creams or suppositories as told.  Get regular exercise. Ask your health care provider how much and what kind of exercise is best for you. In general, you should do moderate exercise for at least 30 minutes on most days of the week (150 minutes each week). This can include activities such as walking, biking, or yoga.  Go to the bathroom when you have the urge to have a bowel movement. Do not wait.  Avoid straining to have bowel movements.  Keep the anal area dry and clean. Use wet toilet paper or moist towelettes after a bowel movement.  Do not sit on the toilet for long periods of time. This increases blood pooling  and pain.  Keep all follow-up visits as told by your health care provider. This is important. Contact a health care provider if you have:  Increasing pain and swelling that are not controlled by treatment or medicine.  Difficulty having a bowel movement, or you are unable to have a bowel movement.  Pain or inflammation outside the area of the hemorrhoids. Get help right away if you have:  Uncontrolled bleeding from your rectum. Summary  Hemorrhoids are swollen veins in and around the rectum or anus.  Most hemorrhoids can be managed with home treatments such as diet and lifestyle changes.  Taking warm sitz baths can help ease pain and discomfort.  In severe cases, procedures or surgery can be done to shrink or remove the hemorrhoids. This information is not intended to replace advice given to you by your health care provider. Make sure you discuss any questions you have with your health care provider. Document Revised: 04/17/2019 Document Reviewed: 04/10/2018 Elsevier Patient Education  [place].   High-Fiber Diet Fiber, also called dietary fiber, is a type of carbohydrate that is found in fruits, vegetables, whole grains, and beans. A high-fiber diet can have many health benefits. Your health care provider may recommend a high-fiber diet to help:  Prevent constipation. Fiber can make your bowel movements more regular.  Lower your cholesterol.  Relieve the following conditions: ? Swelling of veins in the anus (hemorrhoids). ? Swelling and irritation (inflammation) of specific areas of the digestive tract (uncomplicated diverticulosis). ? A problem of the large intestine (colon) that sometimes causes pain and diarrhea (irritable bowel syndrome, IBS).  Prevent overeating as part of a weight-loss plan.  Prevent heart disease, type 2 diabetes, and certain cancers. What is my plan? The recommended daily fiber intake in grams (g) includes:  38 g for men age 52 or  younger.  30 g for men over age 42.  25 g for women age 72 or younger.  21 g for women over age 61. You can get the recommended daily intake of dietary fiber by:  Eating a variety of fruits, vegetables, grains, and beans.  Taking a fiber supplement, if it is not possible to get enough fiber through your diet. What do I need to know about a high-fiber diet?  It is better to get fiber through food sources rather than from fiber supplements. There is not a lot of research about how effective supplements are.  Always check the fiber content on the nutrition facts label of any prepackaged food. Look for foods that contain 5 g of fiber or more per serving.  Talk with a diet and nutrition specialist (dietitian) if you have questions about specific foods that are recommended or not recommended for your medical condition, especially if those foods are not listed below.  Gradually increase how much fiber you consume. If you increase your intake of dietary fiber too quickly, you may have bloating, cramping, or gas.  Drink plenty of water. Water helps you to digest fiber. What are tips for following this plan?  Eat a wide variety of high-fiber foods.  Make sure that half of the grains that you eat each day are whole grains.  Eat breads and cereals that are made with whole-grain flour instead of refined flour or white flour.  Eat brown rice, bulgur wheat, or millet instead of white rice.  Start the day with a breakfast that is high in fiber, such as a cereal that contains 5 g of fiber or more per serving.  Use beans in place of meat in soups, salads, and pasta dishes.  Eat high-fiber snacks, such as berries, raw vegetables, nuts, and popcorn.  Choose whole fruits and vegetables instead of processed forms like juice or sauce. What foods can I eat?  Fruits Berries. Pears. Apples. Oranges. Avocado. Prunes and raisins. Dried figs. Vegetables Sweet potatoes. Spinach. Kale. Artichokes.  Cabbage. Broccoli. Cauliflower. Green peas. Carrots. Squash. Grains Whole-grain breads. Multigrain cereal. Oats and oatmeal. Brown rice. Barley. Bulgur wheat. Mead. Quinoa. Bran muffins. Popcorn. Rye wafer crackers. Meats and other proteins Navy, kidney, and pinto beans. Soybeans. Split peas. Lentils. Nuts and seeds. Dairy Fiber-fortified yogurt. Beverages Fiber-fortified soy milk. Fiber-fortified orange juice. Other foods Fiber bars. The items listed above may not be a complete list of recommended foods and beverages. Contact a dietitian for more options. What foods are not recommended? Fruits Fruit juice. Cooked, strained fruit. Vegetables Fried potatoes. Canned vegetables. Well-cooked vegetables. Grains White bread. Pasta made with refined flour. White rice. Meats and other proteins Fatty cuts of meat. Fried chicken or fried fish. Dairy Milk. Yogurt. Cream cheese. Sour cream. Fats and oils Butters. Beverages Soft drinks. Other foods Cakes and pastries. The items listed above may not be a complete list of foods and beverages to avoid. Contact a dietitian for more information. Summary  Fiber is a type of carbohydrate. It is found in fruits, vegetables, whole grains, and beans.  There are many health benefits of eating a high-fiber diet, such as preventing constipation, lowering blood cholesterol, helping with weight loss, and reducing your risk of heart disease, diabetes, and certain cancers.  Gradually increase your intake of fiber. Increasing too fast can result in cramping, bloating, and gas. Drink plenty of water while you increase your fiber.  The best sources of fiber include whole fruits and vegetables, whole grains, nuts, seeds, and beans. This information is not intended to  replace advice given to you by your health care provider. Make sure you discuss any questions you have with your health care provider. Document Revised: 09/23/2017 Document Reviewed:  09/23/2017 Elsevier Patient Education  2020 [employer].

## 2020-06-17 ENCOUNTER — Other Ambulatory Visit: Payer: Self-pay | Admitting: Family Medicine

## 2020-06-20 ENCOUNTER — Ambulatory Visit: Payer: Self-pay | Admitting: *Deleted

## 2020-06-21 ENCOUNTER — Other Ambulatory Visit: Payer: Self-pay | Admitting: *Deleted

## 2020-06-21 ENCOUNTER — Encounter (HOSPITAL_COMMUNITY): Payer: Self-pay | Admitting: Internal Medicine

## 2020-06-21 LAB — SURGICAL PATHOLOGY

## 2020-06-21 NOTE — Patient Outreach (Signed)
[facility]) Care Management  06/21/2020  [name] 1956/03/24 [id number]   CSW attempted to reach pt by phone on 06/20/2020 and was unable- CSW as able to leave a voice message for return call and will await a callback or outreach again per policy.    Eduard Clos, MSW, [facility] Worker  [place] [phone]

## 2020-06-22 ENCOUNTER — Other Ambulatory Visit: Payer: Self-pay

## 2020-06-22 NOTE — Patient Outreach (Signed)
[facility]) Care Management  06/22/2020  STORMI VANDEVELDE 1956-03-22 [id number]    Case closure:  Placed call to patient for follow up. Patient reports she is planning to go to social services to get her insurance straight. I reviewed with patient that [facility] has her listed at [facility].  Patient reports she is working on this trying to switch to [employer]     PLAN: encouraged patient to call me back is she needs help in the future as currently she has not been able to follow through with completing her change in insurance. She agreed to call me in the future if she has needs.  Will close case to nursing and patient remains active with [facility] social worker.   Tomasa Rand, RN, BSN, CEN [facility] [employer] [phone]

## 2020-06-27 ENCOUNTER — Ambulatory Visit: Payer: Medicare Other | Admitting: Orthopedic Surgery

## 2020-06-27 ENCOUNTER — Other Ambulatory Visit (INDEPENDENT_AMBULATORY_CARE_PROVIDER_SITE_OTHER): Payer: Self-pay | Admitting: *Deleted

## 2020-06-27 ENCOUNTER — Other Ambulatory Visit: Payer: Self-pay

## 2020-06-27 ENCOUNTER — Ambulatory Visit: Payer: Medicare Other

## 2020-06-27 LAB — HM DIABETES EYE EXAM

## 2020-06-27 NOTE — Progress Notes (Signed)
Weight Check 1 month.

## 2020-06-28 ENCOUNTER — Encounter: Payer: Self-pay | Admitting: Orthopedic Surgery

## 2020-06-28 ENCOUNTER — Ambulatory Visit (INDEPENDENT_AMBULATORY_CARE_PROVIDER_SITE_OTHER): Payer: Medicare Other | Admitting: Orthopedic Surgery

## 2020-06-28 VITALS — BP 126/79 | HR 89 | Ht 63.0 in | Wt 124.0 lb

## 2020-06-28 DIAGNOSIS — M778 Other enthesopathies, not elsewhere classified: Secondary | ICD-10-CM | POA: Diagnosis not present

## 2020-06-28 NOTE — Patient Instructions (Signed)
Take the vitamin D   Do the exercises at the [facility]  Come back as needed   I dont think surgery will be needed. You have weakness, around the shoulder

## 2020-06-28 NOTE — Progress Notes (Signed)
SAMMIE SCHERMERHORN  06/28/2020  Body mass index is 21.97 kg/m.   S:  Chief Complaint  Patient presents with  . Shoulder Pain  . Neck Pain   64 year old female status post injection right shoulder for tendinitis in the right shoulder she reports improvement although she says weakness still with forward elevation she says she is getting better she is currently taking vitamin D and calcium and exercise about the [employer]     O: BP 126/79   Pulse 89   Ht 5\' 3"  (1.6 m)   Wt 124 lb (56.2 kg)   BMI 21.97 kg/m   Physical Exam  Weakness on forward elevation some mild scapular winging is noted  MEDICAL DECISION MAKING  Encounter Diagnosis  Name Primary?  . Tendinitis of right shoulder Yes    MANAGEMENT   Recommend therapy continue medication follow-up as needed do not think there if surgery is needed  No orders of the defined types were placed in this encounter.     Arther Abbott, MD  06/28/2020 3:03 PM

## 2020-06-29 ENCOUNTER — Encounter (INDEPENDENT_AMBULATORY_CARE_PROVIDER_SITE_OTHER): Payer: Self-pay | Admitting: *Deleted

## 2020-07-14 ENCOUNTER — Other Ambulatory Visit: Payer: Self-pay

## 2020-07-14 DIAGNOSIS — E1165 Type 2 diabetes mellitus with hyperglycemia: Secondary | ICD-10-CM

## 2020-07-18 ENCOUNTER — Other Ambulatory Visit: Payer: Self-pay | Admitting: *Deleted

## 2020-07-19 ENCOUNTER — Other Ambulatory Visit: Payer: Self-pay

## 2020-07-19 ENCOUNTER — Encounter: Payer: Self-pay | Admitting: Nurse Practitioner

## 2020-07-19 ENCOUNTER — Encounter: Payer: Medicare Other | Attending: "Endocrinology | Admitting: Nutrition

## 2020-07-19 ENCOUNTER — Encounter: Payer: Self-pay | Admitting: Nutrition

## 2020-07-19 ENCOUNTER — Ambulatory Visit (INDEPENDENT_AMBULATORY_CARE_PROVIDER_SITE_OTHER): Payer: Medicare Other | Admitting: Nurse Practitioner

## 2020-07-19 VITALS — Wt 125.0 lb

## 2020-07-19 VITALS — BP 117/78 | HR 81 | Ht 63.0 in | Wt 125.6 lb

## 2020-07-19 DIAGNOSIS — E782 Mixed hyperlipidemia: Secondary | ICD-10-CM | POA: Diagnosis not present

## 2020-07-19 DIAGNOSIS — E1165 Type 2 diabetes mellitus with hyperglycemia: Secondary | ICD-10-CM | POA: Diagnosis not present

## 2020-07-19 LAB — POCT GLYCOSYLATED HEMOGLOBIN (HGB A1C): Hemoglobin A1C: 6.8 % — AB (ref 4.0–5.6)

## 2020-07-19 MED ORDER — ROSUVASTATIN CALCIUM 5 MG PO TABS
5.0000 mg | ORAL_TABLET | Freq: Every day | ORAL | 3 refills | Status: DC
Start: 2020-07-19 — End: 2022-06-08

## 2020-07-19 MED ORDER — GLIPIZIDE ER 5 MG PO TB24
5.0000 mg | ORAL_TABLET | Freq: Every day | ORAL | 3 refills | Status: DC
Start: 2020-07-19 — End: 2022-06-08

## 2020-07-19 NOTE — Progress Notes (Signed)
  Medical Nutrition Therapy:  Appt start time: 9983  end time:  1600 Assessment:  Primary concerns today: DM Type 2. Follow up  A1C further improved to 6.8% , down from 7.5%. Not using her cane today. She notes she is walking better and doesn't need it right now. Gained 1 lb.  Changes made: Working on eating meals on time and balanced meals..   Glipizide  5 mg XL  daily. BS 110-130's usually. Doing well. No low blood sugars.  Lab Results  Component Value Date   HGBA1C 6.8 (A) 07/19/2020   . CMP Latest Ref Rng & Units 02/26/2020 12/28/2019 07/23/2019  Glucose 65 - 99 mg/dL 190(H) 290(H) 971(HH)  BUN 7 - 25 mg/dL 15 10 20   Creatinine 0.50 - 0.99 mg/dL 0.45(L) 0.66 0.82  Sodium 135 - 146 mmol/L 137 125(L) 123(L)  Potassium 3.5 - 5.3 mmol/L 4.0 3.3(L) 4.7  Chloride 98 - 110 mmol/L 100 90(L) 87(L)  CO2 20 - 32 mmol/L 26 24 22   Calcium 8.6 - 10.4 mg/dL 9.6 9.3 8.9  Total Protein 6.1 - 8.1 g/dL 7.0 - -  Total Bilirubin 0.2 - 1.2 mg/dL 0.4 - -  Alkaline Phos 38 - 126 U/L - - -  AST 10 - 35 U/L 19 - -  ALT 6 - 29 U/L 15 - -   CBC    Component Value Date/Time   WBC 9.3 02/26/2020 1040   RBC 4.66 02/26/2020 1040   HGB 12.7 02/26/2020 1040   HCT 39.3 02/26/2020 1040   PLT 193 02/26/2020 1040   MCV 84.3 02/26/2020 1040   MCH 27.3 02/26/2020 1040   MCHC 32.3 02/26/2020 1040   RDW 13.8 02/26/2020 1040   LYMPHSABS 3.3 04/14/2015 1535   MONOABS 0.8 04/14/2015 1535   EOSABS 0.5 04/14/2015 1535   BASOSABS 0.1 04/14/2015 1535    Preferred Learning Style:  No preference indicated   Learning Readiness:  Ready  Change in progress   MEDICATIONS: see list   DIETARY INTAKE:  24-hr recall:  B ( AM):   Eggs, cheese, bacon, whole wheat bread, grapes, coffee Snk ( AM):   L ( PM):T. Greens, cornbread, spare rib, sweet potatoes,  Water  Snk ( PM):  D ( PM): Same as lunch, water  Snk ( PM):  Beverages: water  Usual physical activity: ADL  Estimated energy needs: 1500-1800   calories 170 g carbohydrates 112 g protein 42 g fat  Progress Towards Goal(s):  In progress.   Nutritional Diagnosis:  NB-1.1 Food and nutrition-related knowledge deficit As related to Diabetes Type 2.  As evidenced by A1C 7.5%. It had been as high as 15% per patient..    Intervention:  Nutrition and Diabetes education provided on My Plate, CHO counting, meal planning, portion sizes, timing of meals, avoiding snacks between meals unless having a low blood sugar, target ranges for A1C and blood sugars, signs/symptoms and treatment of hyper/hypoglycemia, monitoring blood sugars, taking medications as prescribed, benefits of exercising 30 minutes per day and prevention of complications of DM.    Marland KitchenGoals Keep up the great job. Keep working increase fresh fruits and vegetables. Keep drinking water Don't skip meals  Teaching Method Utilized:  Visual Auditory Hands on  Handouts given during visit include:  The Plate Method   Diabetes Instructions   Barriers to learning/adherence to lifestyle change: none  Demonstrated degree of understanding via:  Teach Back   Monitoring/Evaluation:  Dietary intake, exercise, and body weight in 3 month(s).

## 2020-07-19 NOTE — Patient Instructions (Signed)

## 2020-07-19 NOTE — Progress Notes (Signed)
07/19/2020, 3:05 PM   Endocrinology follow-up note    Subjective:    Patient ID: [name], female    DOB: [birth year].  [name] is here for follow-up after she was seen in consultation for management of currently uncontrolled symptomatic diabetes requested by  [name], MD.   Past Medical History:  Diagnosis Date  . Allergic rhinitis   . ALLERGIC RHINITIS, SEASONAL 02/27/2008   Qualifier: Diagnosis of  By: Rebecca Eaton LPN, [place] Cerise    . Anemia, deficiency   . Annual physical exam 08/15/2015  . Diabetes mellitus   . Educated about COVID-19 virus infection 05/01/2019  . GERD 02/27/2008   Qualifier: Diagnosis of  By: Rebecca Eaton LPN, [place] Cerise    . GERD (gastroesophageal reflux disease)   . Hyperlipidemia   . Hypertension   . Obesity   . Post-menopausal bleeding 08/15/2015    Past Surgical History:  Procedure Laterality Date  . BIOPSY  03/23/2020   Procedure: BIOPSY;  Surgeon: Rogene Houston, MD;  [facility];  Service: Endoscopy;;  gastric  . COLONOSCOPY N/A 06/16/2020   Procedure: COLONOSCOPY;  Surgeon: Rogene Houston, MD;  [facility];  Service: Endoscopy;  Laterality: N/A;  220  . ESOPHAGOGASTRODUODENOSCOPY (EGD) WITH PROPOFOL N/A 03/23/2020   Procedure: ESOPHAGOGASTRODUODENOSCOPY (EGD) WITH PROPOFOL;  Surgeon: Rogene Houston, MD;  [facility];  Service: Endoscopy;  Laterality: N/A;  125  . EXTRACTION OF SEVENTH TEETH  2007  . MALONEY DILATION  03/23/2020   Procedure: Venia Minks DILATION;  Surgeon: Rogene Houston, MD;  [facility];  Service: Endoscopy;;  . POLYPECTOMY  06/16/2020   Procedure: POLYPECTOMY;  Surgeon: Rogene Houston, MD;  [facility];  Service: Endoscopy;;  . REMOVAL OF GREAT TOENAIL  2005  . SPLENECTOMY  2002    Social History   Socioeconomic History  . Marital status: Divorced    Spouse name: Not on file   . Number of children: 1  . Years of education: Not on file  . Highest education level: Not on file  Occupational History  . Occupation: DISABLED   Tobacco Use  . Smoking status: Never Smoker  . Smokeless tobacco: Never Used  Vaping Use  . Vaping Use: Never used  Substance and Sexual Activity  . Alcohol use: No  . Drug use: No  . Sexual activity: Yes    Birth control/protection: Post-menopausal  Other Topics Concern  . Not on file  Social History Narrative  . Not on file   Social Determinants of Health   Financial Resource Strain: Medium Risk  . Difficulty of Paying Living Expenses: Somewhat hard  Food Insecurity: Food Insecurity Present  . Worried About Charity fundraiser in the Last Year: Sometimes true  . Ran Out of Food in the Last Year: Sometimes true  Transportation Needs: No Transportation Needs  . Lack of Transportation (Medical): No  . Lack of Transportation (Non-Medical): No  Physical Activity: Sufficiently Active  . Days of Exercise per Week: 5 days  . Minutes of Exercise per Session: 60 min  Stress: Stress Concern Present  .  Feeling of Stress : To some extent  Social Connections: Moderately Integrated  . Frequency of Communication with Friends and Family: More than three times a week  . Frequency of Social Gatherings with Friends and Family: More than three times a week  . Attends Religious Services: More than 4 times per year  . Active Member of Clubs or Organizations: Yes  . Attends Archivist Meetings: Never  . Marital Status: Divorced    Family History  Problem Relation Age of Onset  . Diabetes Mother   . Hypertension Mother   . Hyperlipidemia Mother   . Lung disease Father   . Hypertension Sister   . Hyperlipidemia Sister   . Hypertension Brother   . Sickle cell trait Son   . Arthritis Other   . Asthma Other   . Cancer Other     Outpatient Encounter Medications as of 07/19/2020  Medication Sig  . acetaminophen (TYLENOL) 650 MG  CR tablet Take 1,300 mg by mouth every 8 (eight) hours as needed for pain.  Marland Kitchen alum & mag hydroxide-simeth (MAALOX/MYLANTA) 200-200-20 MG/5ML suspension Take 30 mLs by mouth every 6 (six) hours as needed for indigestion or heartburn.  . bisacodyl (DULCOLAX) 5 MG EC tablet Take 5 mg by mouth at bedtime.  . Calcium Carb-Cholecalciferol (CALTRATE 600+D3 PO) Take 1 tablet by mouth in the morning and at bedtime.  . calcium carbonate (TUMS - DOSED IN MG ELEMENTAL CALCIUM) 500 MG chewable tablet Chew 2 tablets by mouth 3 (three) times daily as needed for indigestion or heartburn.  . cholecalciferol (QC VITAMIN D3) 10 MCG (400 UNIT) TABS tablet Take 400 Units by mouth daily.  . clotrimazole-betamethasone (LOTRISONE) cream Apply twice daily to rash on left shoulder for 10 to 14 days, then as needed (Patient taking differently: Apply 1 application topically at bedtime. )  . COLOSTRUM PO Take 3 tablets by mouth daily before breakfast. Colostrum Plus Symbiotics  . ECHINACEA PO Take 760 mg by mouth daily.  . Ensure Plus (ENSURE PLUS) LIQD Take 237 mLs by mouth in the morning and at bedtime.  . Ferrous Sulfate (IRON PO) Take 25 mg by mouth 3 (three) times a week.  . Garlic 4580 MG CAPS Take 1,000 mg by mouth at bedtime.  Marland Kitchen glipiZIDE (GLUCOTROL XL) 10 MG 24 hr tablet Take 1 tablet (10 mg total) by mouth daily with breakfast.  . glucose blood (ACCU-CHEK GUIDE) test strip USE 1 STRIP TO CHECK GLUCOSE TWICE DAILY  . Lancets MISC 1 each by Does not apply route as directed.  . Multiple Vitamin (MULTIVITAMIN WITH MINERALS) TABS tablet Take 1 tablet by mouth daily.  . Naphazoline-Glycerin (REDNESS RELIEF OP) Place 1 drop into both eyes 3 (three) times daily as needed (redness).   . Omega-3 Fatty Acids (FISH OIL) 1000 MG CAPS Take 1,000 mg by mouth daily.  . ramipril (ALTACE) 2.5 MG capsule Take 1 capsule by mouth once daily  . senna (SENOKOT) 8.6 MG tablet Take 2 tablets by mouth at bedtime.  Marland Kitchen UNABLE TO FIND Posture  Cane x 1  DX R26.81   No facility-administered encounter medications on file as of 07/19/2020.    ALLERGIES: Allergies  Allergen Reactions  . Citrus     In large amounts causes congestion / acid reflux   . Codeine Itching  . Milk-Related Compounds Other (See Comments)    Constipation   . Onion     gas  . Pantoprazole     Felt like I was  going to pass out    VACCINATION STATUS: Immunization History  Administered Date(s) Administered  . H1N1 12/28/2008  . Influenza Split 10/01/2011, 10/09/2012  . Influenza Whole 08/13/2007, 09/06/2008, 08/24/2009, 11/21/2010  . Influenza,inj,Quad PF,6+ Mos 09/10/2013, 08/11/2014, 08/02/2015, 09/13/2016, 10/29/2017  . PPD Test 06/09/2013  . Pneumococcal Conjugate-13 04/14/2015  . Pneumococcal Polysaccharide-23 10/25/2004, 09/10/2013, 05/24/2020  . Td 12/04/1995, 08/24/2009  . Tdap 12/25/2012    Diabetes She presents for her follow-up diabetic visit. She has type 2 diabetes mellitus. Onset time: She was diagnosed at approximate age of 62 years. Her disease course has been stable. There are no hypoglycemic associated symptoms. Pertinent negatives for hypoglycemia include no confusion, headaches, pallor or seizures. Pertinent negatives for diabetes include no blurred vision, no chest pain, no fatigue, no polydipsia, no polyphagia and no polyuria. There are no hypoglycemic complications. Symptoms are stable. There are no diabetic complications. Risk factors for coronary artery disease include diabetes mellitus, post-menopausal, sedentary lifestyle and dyslipidemia. Current diabetic treatment includes oral agent (monotherapy). She is compliant with treatment all of the time. Her weight is increasing steadily. She is following a generally unhealthy diet. When asked about meal planning, she reported none. She has not had a previous visit with a dietitian. She participates in exercise three times a week. Her home blood glucose trend is fluctuating minimally.  Her breakfast blood glucose range is generally 140-180 mg/dl. (She presents today with her meter and logs showing slightly above target fasting and postprandial glycemic profile.  Her point-of-care A1c today is 6.8%, improving from last visit at 7.5%.  There are no documented or reported episodes of hypoglycemia.) An ACE inhibitor/angiotensin II receptor blocker is being taken. She does not see a podiatrist.Eye exam is not current.  Hyperlipidemia This is a chronic problem. The current episode started more than 1 year ago. The problem is uncontrolled. Recent lipid tests were reviewed and are high. Exacerbating diseases include diabetes. Pertinent negatives include no chest pain, myalgias or shortness of breath. Current antihyperlipidemic treatment includes herbal therapy. The current treatment provides mild improvement of lipids. There are no compliance problems.  Risk factors for coronary artery disease include dyslipidemia, diabetes mellitus, a sedentary lifestyle, post-menopausal, family history and hypertension.    Review of systems  Constitutional: slowly gaining wt she previously lost unintentially,   current  Body mass index is 22.25 kg/m. , no fatigue, no subjective hyperthermia, no subjective hypothermia Eyes: no blurry vision, no xerophthalmia ENT: no sore throat, no nodules palpated in throat, no dysphagia/odynophagia, no hoarseness Cardiovascular: no Chest Pain, no Shortness of Breath, no palpitations, no leg swelling Respiratory: no cough, no shortness of breath Gastrointestinal:  She complains of constipation, no vomiting no diarrhea.   Musculoskeletal: She usually walks with a cane due to disequilibrium but did not bring it with her today ,no muscle/joint aches Skin: no rashes, no hyperemia Neurological: no tremors, no numbness, no tingling, no dizziness Psychiatric: no depression, no anxiety   Objective:    BP 117/78 (BP Location: Right Arm, Patient Position: Sitting)   Pulse  81   Ht 5\' 3"  (1.6 m)   Wt 125 lb 9.6 oz (57 kg)   BMI 22.25 kg/m   Wt Readings from Last 3 Encounters:  07/19/20 125 lb 9.6 oz (57 kg)  06/28/20 124 lb (56.2 kg)  05/30/20 119 lb 3.2 oz (54.1 kg)    BP Readings from Last 3 Encounters:  07/19/20 117/78  06/28/20 126/79  06/16/20 94/66     Physical Exam- Limited  Constitutional:  Body mass index is 22.25 kg/m. , not in acute distress, normal state of mind Eyes:  EOMI, no exophthalmos Neck: Supple Thyroid: No gross goiter Cardiovascular: RRR, no murmers, rubs, or gallops, no edema Respiratory: Adequate breathing efforts, no crackles, rales, rhonchi, or wheezing Musculoskeletal: no gross deformities, strength intact in all four extremities, no gross restriction of joint movements Skin:  no rashes, no hyperemia Neurological: no tremor with outstretched hands   CMP ( most recent) CMP     Component Value Date/Time   NA 137 02/26/2020 1040   K 4.0 02/26/2020 1040   CL 100 02/26/2020 1040   CO2 26 02/26/2020 1040   GLUCOSE 190 (H) 02/26/2020 1040   BUN 15 02/26/2020 1040   CREATININE 0.45 (L) 02/26/2020 1040   CALCIUM 9.6 02/26/2020 1040   PROT 7.0 02/26/2020 1040   ALBUMIN 3.9 07/23/2019 1616   AST 19 02/26/2020 1040   ALT 15 02/26/2020 1040   ALKPHOS 107 07/23/2019 1616   BILITOT 0.4 02/26/2020 1040   GFRNONAA 105 02/26/2020 1040   GFRAA 122 02/26/2020 1040     Diabetic Labs (most recent): Lab Results  Component Value Date   HGBA1C 7.5 (H) 03/07/2020   HGBA1C >15.5 (H) 07/23/2019   HGBA1C 13.6 (H) 10/28/2017     Lipid Panel ( most recent) Lipid Panel     Component Value Date/Time   CHOL 167 02/26/2020 1040   TRIG 69 02/26/2020 1040   HDL 52 02/26/2020 1040   CHOLHDL 3.2 02/26/2020 1040   VLDL 22 07/19/2017 1102   LDLCALC 99 02/26/2020 1040   LDLCALC 94 12/07/2014 0000      Lab Results  Component Value Date   TSH 1.25 03/07/2020   TSH 0.86 02/18/2017   TSH 2.126 04/14/2015   TSH 2.070  04/14/2014   TSH 2.190 04/15/2013   TSH 1.638 10/01/2011   TSH 1.831 03/09/2010   TSH 2.191 12/28/2008      Assessment & Plan:   1. Uncontrolled type 2 diabetes mellitus with hyperglycemia ([place])  - ANGINETTE ESPEJO has currently uncontrolled symptomatic type 2 DM since  64 years of age.    She presents today with her meter and logs showing slightly above target fasting and postprandial glycemic profile.  Her point-of-care A1c today is 6.8%, improving from last visit at 7.5%.  There are no documented or reported episodes of hypoglycemia.  - I had a long discussion with her about the progressive nature of diabetes and the pathology behind its complications. -her diabetes is complicated by noncompliance and she remains at a high risk for more acute and chronic complications which include CAD, CVA, CKD, retinopathy, and neuropathy. These are all discussed in detail with her.  - The patient admits there is a room for improvement in their diet and drink choices. -  Suggestion is made for the patient to avoid simple carbohydrates from their diet including Cakes, Sweet Desserts / Pastries, Ice Cream, Soda (diet and regular), Sweet Tea, Candies, Chips, Cookies, Sweet Pastries,  Store Bought Juices, Alcohol in Excess of  1-2 drinks a day, Artificial Sweeteners, Coffee Creamer, and "Sugar-free" Products. This will help patient to have stable blood glucose profile and potentially avoid unintended weight gain.   - I encouraged the patient to switch to  unprocessed or minimally processed complex starch and increased protein intake (animal or plant source), fruits, and vegetables.   - Patient is advised to stick to a routine mealtimes to eat 3 meals  a  day and avoid unnecessary snacks ( to snack only to correct hypoglycemia).  -Given her presentation with controlled glycemic profile as a result of her weight loss, she would not need insulin treatment.  She is already taken off of Metformin at this time.    -There has been some confusion to the patient about how much glipizide she should be taking.  She says her PCP recently put her on 10 mg, but this dose seems to be too much for her.  We need to avoid hypoglycemia.  She is advised to start her glipizide 5 mg XR daily with breakfast.    -I encouraged her to discard her previous glipizide prescriptions for safety purposes. -She is encouraged to test blood glucose at least twice a day, before breakfast and before bed. - she is encouraged to call clinic for blood glucose levels less than 70 or above 300 mg /dl.  - Specific targets for  A1c;  LDL, HDL, Triglycerides, and  Waist Circumference were discussed with the patient.  2) Blood Pressure /Hypertension: Her blood pressure is controlled to target.  She is advised to continue ramipril 2.5 mg p.o. daily.   3) Lipids/Hyperlipidemia:  Review of her recent lipid profile shows uncontrolled LDL at 99 on 02/2020.  It appears she is no longer taking her Crestor because she did not have a pill splitter to cut her dose in half.  I have sent in an Rx for appropriate dose to her pharmacy to avoid having to cut pills.  She is taking omega-3 fatty acid 1000 mg p.o. daily and is advised to continue.  4)  Weight/Diet:  Her Body mass index is 22.25 kg/m. dropping from 27.  She is not a candidate for weight loss anymore especially unintentionally.   5) Chronic Care/Health Maintenance:  -she  Is on ACEI/ARB  medications and  is encouraged to initiate and continue to follow up with Ophthalmology, Dentist,  Podiatrist at least yearly or according to recommendations, and advised to   stay away from smoking. I have recommended yearly flu vaccine and pneumonia vaccine at least every 5 years; moderate intensity exercise for up to 150 minutes weekly; and  sleep for at least 7 hours a day.  - she is  advised to maintain close follow up with [name], MD for primary care needs, as well as her other providers for  optimal and coordinated care.  - Time spent on this patient care encounter:  35 min, of which > 50% was spent in  counseling and the rest reviewing her blood glucose logs , discussing her hypoglycemia and hyperglycemia episodes, reviewing her current and  previous labs / studies  ( including abstraction from other facilities) and medications  doses and developing a  long term treatment plan and documenting her care.   Please refer to Patient Instructions for Blood Glucose Monitoring and Insulin/Medications Dosing Guide"  in media tab for additional information. Please  also refer to " Patient Self Inventory" in the Media  tab for reviewed elements of pertinent patient history.  Coralie Common participated in the discussions, expressed understanding, and voiced agreement with the above plans.  All questions were answered to her satisfaction. she is encouraged to contact clinic should she have any questions or concerns prior to her return visit.    Follow up plan: - Return in about 4 months (around 11/18/2020) for Diabetes follow up- A1c and urine micro in office, No previsit labs, Bring glucometer and logs.  Timouthy Gilardi  [facility] Phone: [phone] Fax: [phone]    07/19/2020, 3:05 PM  This note was partially dictated with voice recognition software. Similar sounding words can be transcribed inadequately or may not  be corrected upon review.

## 2020-07-19 NOTE — Patient Instructions (Signed)
.  Goals Keep up the great job. Keep working increase fresh fruits and vegetables. Keep drinking water Don't skip meals

## 2020-07-19 NOTE — Patient Outreach (Signed)
[facility]) Care Management  07/19/2020  [name] 11-May-1956 [id number]   [place] spoke with pt on 07/18/2020 who reports she is looking in to buying a modular and moving out of her rental home. Pt shared concerns related to her current housing arrangements and wants to see about having a home with more bedrooms and more space given her 65yo granddaughter is living with her.  Pt denies any depression; stating, "I keep on going" and shared her strong faith and family/friends support.    Pt denies any further needs and will continue her personal search for housing to own.    CSW will sign off and will advise PCP and [facility] team of above.   Eduard Clos, MSW, [place] Worker  [place] [phone]

## 2020-08-29 ENCOUNTER — Encounter: Payer: Self-pay | Admitting: Family Medicine

## 2020-08-29 ENCOUNTER — Other Ambulatory Visit: Payer: Self-pay

## 2020-08-29 ENCOUNTER — Ambulatory Visit (INDEPENDENT_AMBULATORY_CARE_PROVIDER_SITE_OTHER): Payer: Medicare Other | Admitting: Family Medicine

## 2020-08-29 VITALS — BP 120/70 | HR 72 | Resp 16 | Ht 64.0 in | Wt 128.1 lb

## 2020-08-29 DIAGNOSIS — E782 Mixed hyperlipidemia: Secondary | ICD-10-CM | POA: Diagnosis not present

## 2020-08-29 DIAGNOSIS — Z780 Asymptomatic menopausal state: Secondary | ICD-10-CM

## 2020-08-29 DIAGNOSIS — Z23 Encounter for immunization: Secondary | ICD-10-CM | POA: Diagnosis not present

## 2020-08-29 DIAGNOSIS — E1140 Type 2 diabetes mellitus with diabetic neuropathy, unspecified: Secondary | ICD-10-CM

## 2020-08-29 MED ORDER — PENICILLIN V POTASSIUM 500 MG PO TABS
500.0000 mg | ORAL_TABLET | Freq: Three times a day (TID) | ORAL | 0 refills | Status: DC
Start: 2020-08-29 — End: 2021-05-17

## 2020-08-29 MED ORDER — CLOTRIMAZOLE-BETAMETHASONE 1-0.05 % EX CREA
1.0000 | TOPICAL_CREAM | Freq: Two times a day (BID) | CUTANEOUS | 1 refills | Status: DC
Start: 2020-08-29 — End: 2022-06-08

## 2020-08-29 NOTE — Patient Instructions (Addendum)
Physical exam with pap in March 2022, call if you need me sooner  Congrats on excellent blood sugar and blood pressure  You are referred for bone density test.  Microalb from office today ( if able)  Flu vaccine today  Reconsider and think about getting the Covid vaccine please This is recommended, you DO NEED IT!  Please reconsider the mammogram, you DO NEED THIS  You will be referred to Podiatry for diabetic foot care  Fasting lipid, cmp and eGFR, tSH, CBC and vit D 1 end February  Penicillin and antifungal cream are prescribed

## 2020-08-30 ENCOUNTER — Encounter: Payer: Self-pay | Admitting: Family Medicine

## 2020-08-30 ENCOUNTER — Other Ambulatory Visit (HOSPITAL_COMMUNITY)
Admission: RE | Admit: 2020-08-30 | Discharge: 2020-08-30 | Disposition: A | Discharge status: Home / Self Care | Payer: Medicare Other | Source: Other Acute Inpatient Hospital | Attending: Family Medicine | Admitting: Family Medicine

## 2020-08-30 DIAGNOSIS — E1140 Type 2 diabetes mellitus with diabetic neuropathy, unspecified: Secondary | ICD-10-CM | POA: Diagnosis not present

## 2020-08-30 NOTE — Assessment & Plan Note (Signed)
" >>  ASSESSMENT AND PLAN FOR TYPE II DIABETES MELLITUS ([facility]) WRITTEN ON 08/30/2020 12:42 PM BY Telisha Zawadzki E, MD  Markedly improved and managed by Endo Ms. Steven is reminded of the importance of commitment to daily physical activity for 30 minutes or more, as able and the need to limit carbohydrate intake to 30 to 60 grams per meal to help with blood sugar control.   The need to take medication as prescribed, test blood sugar as directed, and to call between visits if there is a concern that blood sugar is uncontrolled is also discussed.   Ms. Boatman is reminded of the importance of daily foot exam, annual eye examination, and good blood sugar, blood pressure and cholesterol control.  Diabetic Labs Latest Ref Rng & Units 07/19/2020 03/07/2020 02/26/2020 12/28/2019 07/24/2019  HbA1c 4.0 - 5.6 % 6.8(A) 7.5(H) - - -  Microalbumin Not Estab. ug/mL - - - - <3.0(H)  Micro/Creat Ratio 0 - 29 mg/g creat - - - - <28  Chol <200 mg/dL - - 832 - -  HDL > OR = 50 mg/dL - - 52 - -  Calc LDL mg/dL (calc) - - 99 - -  Triglycerides <150 mg/dL - - 69 - -  Creatinine 0.50 - 0.99 mg/dL - - 9.54(O) 9.33 -   BP/Weight 08/29/2020 07/19/2020 07/19/2020 06/28/2020 06/16/2020 05/30/2020 05/24/2020  Systolic BP 120 - 117 126 94 125 127  Diastolic BP 70 - 78 79 66 77 84  Wt. (Lbs) 128.12 125 125.6 124 - 119.2 117  BMI 21.99 22.14 22.25 21.97 20.46 21.12 20.73   Foot/eye exam completion dates Latest Ref Rng & Units 08/29/2020 06/27/2020  Eye Exam No Retinopathy - No Retinopathy  Foot exam Order - - -  Foot Form Completion - Done -       "

## 2020-08-30 NOTE — Progress Notes (Signed)
[name]     MRN: [id number]      DOB: 1956-03-12   HPI [name] is here for follow up and re-evaluation of chronic medical conditions, medication management and review of any available recent lab and radiology data.  Preventive health is updated, specifically  Cancer screening and Immunization.   Questions or concerns regarding consultations or procedures which the PT has had in the interim are  addressed. The PT denies any adverse reactions to current medications since the last visit.  There are no new concerns.  There are no specific complaints   ROS Denies recent fever or chills. Denies sinus pressure, nasal congestion, ear pain or sore throat. Denies chest congestion, productive cough or wheezing. Denies chest pains, palpitations and leg swelling Denies abdominal pain, nausea, vomiting,diarrhea or constipation.   Denies dysuria, frequency, hesitancy or incontinence. Denies joint pain, swelling and limitation in mobility. Denies headaches, seizures, numbness, or tingling. Denies depression, anxiety or insomnia. Denies skin break down or rash.   PE  BP 120/70   Pulse 72   Resp 16   Ht 5\' 4"  (1.626 m)   Wt 128 lb 1.9 oz (58.1 kg)   SpO2 98%   BMI 21.99 kg/m   Patient alert and oriented and in no cardiopulmonary distress.  HEENT: No facial asymmetry, EOMI,     Neck supple .  Chest: Clear to auscultation bilaterally.  CVS: S1, S2 no murmurs, no S3.Regular rate.  ABD: Soft non tender.   Ext: No edema  MS: Adequate ROM spine, shoulders, hips and knees.  Skin: Intact, no ulcerations or rash noted.  Psych: Good eye contact, normal affect. Memory intact not anxious or depressed appearing.  CNS: CN 2-12 intact, power,  normal throughout.no focal deficits noted.   Assessment & Plan  Type II diabetes mellitus ([place]) Markedly improved and managed by Endo [name] is reminded of the importance of commitment to daily physical activity for 30 minutes or more,  as able and the need to limit carbohydrate intake to 30 to 60 grams per meal to help with blood sugar control.   The need to take medication as prescribed, test blood sugar as directed, and to call between visits if there is a concern that blood sugar is uncontrolled is also discussed.   [name] is reminded of the importance of daily foot exam, annual eye examination, and good blood sugar, blood pressure and cholesterol control.  Diabetic Labs Latest Ref Rng & Units 07/19/2020 03/07/2020 02/26/2020 12/28/2019 07/24/2019  HbA1c 4.0 - 5.6 % 6.8(A) 7.5(H) - - -  Microalbumin Not Estab. ug/mL - - - - <3.0(H)  Micro/Creat Ratio 0 - 29 mg/g creat - - - - <28  Chol <200 mg/dL - - 167 - -  HDL > OR = 50 mg/dL - - 52 - -  Calc LDL mg/dL (calc) - - 99 - -  Triglycerides <150 mg/dL - - 69 - -  Creatinine 0.50 - 0.99 mg/dL - - 0.45(L) 0.66 -   BP/Weight 08/29/2020 07/19/2020 07/19/2020 06/28/2020 06/16/2020 05/30/2020 12/21/1476  Systolic BP 295 - 621 308 94 657 846  Diastolic BP 70 - 78 79 66 77 84  Wt. (Lbs) 128.12 125 125.6 124 - 119.2 117  BMI 21.99 22.14 22.25 21.97 20.46 21.12 20.73   Foot/eye exam completion dates Latest Ref Rng & Units 08/29/2020 06/27/2020  Eye Exam No Retinopathy - No Retinopathy  Foot exam Order - - -  Foot Form Completion - Done -  Hyperlipidemia Hyperlipidemia:Low fat diet discussed and encouraged.   Lipid Panel  Lab Results  Component Value Date   CHOL 167 02/26/2020   HDL 52 02/26/2020   LDLCALC 99 02/26/2020   TRIG 69 02/26/2020   CHOLHDL 3.2 02/26/2020   Controlled, no change in medication

## 2020-08-30 NOTE — Assessment & Plan Note (Signed)
Markedly improved and managed by Endo [name] is reminded of the importance of commitment to daily physical activity for 30 minutes or more, as able and the need to limit carbohydrate intake to 30 to 60 grams per meal to help with blood sugar control.   The need to take medication as prescribed, test blood sugar as directed, and to call between visits if there is a concern that blood sugar is uncontrolled is also discussed.   [name] is reminded of the importance of daily foot exam, annual eye examination, and good blood sugar, blood pressure and cholesterol control.  Diabetic Labs Latest Ref Rng & Units 07/19/2020 03/07/2020 02/26/2020 12/28/2019 07/24/2019  HbA1c 4.0 - 5.6 % 6.8(A) 7.5(H) - - -  Microalbumin Not Estab. ug/mL - - - - <3.0(H)  Micro/Creat Ratio 0 - 29 mg/g creat - - - - <28  Chol <200 mg/dL - - 167 - -  HDL > OR = 50 mg/dL - - 52 - -  Calc LDL mg/dL (calc) - - 99 - -  Triglycerides <150 mg/dL - - 69 - -  Creatinine 0.50 - 0.99 mg/dL - - 0.45(L) 0.66 -   BP/Weight 08/29/2020 07/19/2020 07/19/2020 06/28/2020 06/16/2020 05/30/2020 7/56/4332  Systolic BP 951 - 884 166 94 063 016  Diastolic BP 70 - 78 79 66 77 84  Wt. (Lbs) 128.12 125 125.6 124 - 119.2 117  BMI 21.99 22.14 22.25 21.97 20.46 21.12 20.73   Foot/eye exam completion dates Latest Ref Rng & Units 08/29/2020 06/27/2020  Eye Exam No Retinopathy - No Retinopathy  Foot exam Order - - -  Foot Form Completion - Done -

## 2020-08-30 NOTE — Assessment & Plan Note (Signed)
Hyperlipidemia:Low fat diet discussed and encouraged.   Lipid Panel  Lab Results  Component Value Date   CHOL 167 02/26/2020   HDL 52 02/26/2020   LDLCALC 99 02/26/2020   TRIG 69 02/26/2020   CHOLHDL 3.2 02/26/2020   Controlled, no change in medication

## 2020-08-31 LAB — MICROALBUMIN, URINE: Microalb, Ur: 29.9 ug/mL — ABNORMAL HIGH

## 2020-09-27 ENCOUNTER — Other Ambulatory Visit: Payer: Self-pay | Admitting: "Endocrinology

## 2020-09-27 DIAGNOSIS — E1165 Type 2 diabetes mellitus with hyperglycemia: Secondary | ICD-10-CM

## 2020-11-21 ENCOUNTER — Telehealth: Payer: Self-pay

## 2020-11-22 ENCOUNTER — Ambulatory Visit: Payer: Medicare Other | Admitting: Nutrition

## 2020-11-22 ENCOUNTER — Ambulatory Visit: Payer: Medicare Other | Admitting: Nurse Practitioner

## 2021-02-01 NOTE — Telephone Encounter (Signed)
error 

## 2021-02-20 ENCOUNTER — Ambulatory Visit: Payer: Medicare Other | Admitting: Family Medicine

## 2021-03-16 ENCOUNTER — Ambulatory Visit: Payer: Medicare Other | Admitting: Family Medicine

## 2021-05-17 ENCOUNTER — Ambulatory Visit (INDEPENDENT_AMBULATORY_CARE_PROVIDER_SITE_OTHER): Payer: Medicare HMO

## 2021-05-17 ENCOUNTER — Other Ambulatory Visit: Payer: Self-pay

## 2021-05-17 DIAGNOSIS — Z0000 Encounter for general adult medical examination without abnormal findings: Secondary | ICD-10-CM | POA: Diagnosis not present

## 2021-05-17 DIAGNOSIS — Z1231 Encounter for screening mammogram for malignant neoplasm of breast: Secondary | ICD-10-CM | POA: Diagnosis not present

## 2021-05-17 NOTE — Progress Notes (Addendum)
Subjective:   [name] is a 65 y.o. female who presents for Medicare Annual (Subsequent) preventive examination.  I connected with [name] today by telephone and verified that I am speaking with the correct person using two identifiers. Location patient: home Location provider: work Persons participating in the virtual visit: patient, provider.   I discussed the limitations, risks, security and privacy concerns of performing an evaluation and management service by telephone and the availability of in person appointments. I also discussed with the patient that there may be a patient responsible charge related to this service. The patient expressed understanding and verbally consented to this telephonic visit.    Interactive audio and video telecommunications were attempted between this provider and patient, however failed, due to patient having technical difficulties OR patient did not have access to video capability.  We continued and completed visit with audio only.     Review of Systems    NA  Cardiac Risk Factors include: advanced age (>4men, >55 women);diabetes mellitus;dyslipidemia     Objective:    Today's Vitals   There is no height or weight on file to calculate BMI.  Advanced Directives 05/17/2021 06/16/2020 05/10/2020 03/23/2020 07/27/2019 07/23/2019 03/10/2019  Does Patient Have a Medical Advance Directive? Yes No Yes No No No Yes  Type of Paramedic of [place];Living will - [place];Living will - - - Living will  Does patient want to make changes to medical advance directive? No - Patient declined - No - Patient declined - - - No - Patient declined  Copy of [place] in Chart? No - copy requested - No - copy requested - - - -  Would patient like information on creating a medical advance directive? - No - Patient declined - No - Patient declined No - Patient declined No - Patient declined -     Current Medications (verified) Outpatient Encounter Medications as of 05/17/2021  Medication Sig   alum & mag hydroxide-simeth (MAALOX/MYLANTA) 200-200-20 MG/5ML suspension Take 30 mLs by mouth every 6 (six) hours as needed for indigestion or heartburn.   bisacodyl (DULCOLAX) 5 MG EC tablet Take 5 mg by mouth at bedtime.   Calcium Carb-Cholecalciferol (CALTRATE 600+D3 PO) Take 1 tablet by mouth in the morning and at bedtime.   calcium carbonate (TUMS - DOSED IN MG ELEMENTAL CALCIUM) 500 MG chewable tablet Chew 2 tablets by mouth 3 (three) times daily as needed for indigestion or heartburn.   cholecalciferol (VITAMIN D3) 10 MCG (400 UNIT) TABS tablet Take 400 Units by mouth daily.   COLOSTRUM PO Take 3 tablets by mouth daily before breakfast. Colostrum Plus Symbiotics   ECHINACEA PO Take 760 mg by mouth daily.   Ferrous Sulfate (IRON PO) Take 25 mg by mouth 3 (three) times a week.   Garlic 6948 MG CAPS Take 1,000 mg by mouth at bedtime.   Multiple Vitamin (MULTIVITAMIN WITH MINERALS) TABS tablet Take 1 tablet by mouth daily.   Naphazoline-Glycerin (REDNESS RELIEF OP) Place 1 drop into both eyes 3 (three) times daily as needed (redness).    Omega-3 Fatty Acids (FISH OIL) 1000 MG CAPS Take 1,000 mg by mouth daily.   senna (SENOKOT) 8.6 MG tablet Take 2 tablets by mouth at bedtime.   UNABLE TO FIND Posture Cane x 1  DX R26.81   ACCU-CHEK GUIDE test strip USE 1 STRIP TO CHECK GLUCOSE TWICE DAILY (Patient not taking: Reported on 05/17/2021)   acetaminophen (TYLENOL) 650  MG CR tablet Take 1,300 mg by mouth every 8 (eight) hours as needed for pain. (Patient not taking: Reported on 05/17/2021)   clotrimazole-betamethasone (LOTRISONE) cream Apply 1 application topically 2 (two) times daily. (Patient not taking: Reported on 05/17/2021)   Ensure Plus (ENSURE PLUS) LIQD Take 237 mLs by mouth in the morning and at bedtime. (Patient not taking: Reported on 05/17/2021)   glipiZIDE (GLUCOTROL XL) 5 MG 24 hr  tablet Take 1 tablet (5 mg total) by mouth daily with breakfast. (Patient not taking: Reported on 05/17/2021)   Lancets MISC 1 each by Does not apply route as directed. (Patient not taking: Reported on 05/17/2021)   ramipril (ALTACE) 2.5 MG capsule Take 1 capsule by mouth once daily (Patient not taking: Reported on 05/17/2021)   rosuvastatin (CRESTOR) 5 MG tablet Take 1 tablet (5 mg total) by mouth daily. (Patient not taking: Reported on 05/17/2021)   [DISCONTINUED] penicillin v potassium (VEETID) 500 MG tablet Take 1 tablet (500 mg total) by mouth 3 (three) times daily. (Patient not taking: Reported on 05/17/2021)   No facility-administered encounter medications on file as of 05/17/2021.    Allergies (verified) Citrus, Codeine, Milk-related compounds, Onion, and Pantoprazole   History: Past Medical History:  Diagnosis Date   Allergic rhinitis    ALLERGIC RHINITIS, SEASONAL 02/27/2008   Qualifier: Diagnosis of  By: Emma Eaton LPN, [name]     Anemia, deficiency    Annual physical exam 08/15/2015   Diabetes mellitus    Educated about COVID-19 virus infection 05/01/2019   GERD 02/27/2008   Qualifier: Diagnosis of  By: Emma Eaton LPN, [place] Cerise     GERD (gastroesophageal reflux disease)    Hyperlipidemia    Hypertension    Obesity    Post-menopausal bleeding 08/15/2015   Past Surgical History:  Procedure Laterality Date   BIOPSY  03/23/2020   Procedure: BIOPSY;  Surgeon: Rogene Houston, MD;  [facility]: [facility];  Service: Endoscopy;;  gastric   COLONOSCOPY N/A 06/16/2020   Procedure: COLONOSCOPY;  Surgeon: Rogene Houston, MD;  [facility]: [facility];  Service: Endoscopy;  Laterality: N/A;  220   ESOPHAGOGASTRODUODENOSCOPY (EGD) WITH PROPOFOL N/A 03/23/2020   Procedure: ESOPHAGOGASTRODUODENOSCOPY (EGD) WITH PROPOFOL;  Surgeon: Rogene Houston, MD;  Location: [facility];  Service: Endoscopy;  Laterality: N/A;  125   EXTRACTION OF SEVENTH TEETH  2007   MALONEY DILATION  03/23/2020   Procedure:  MALONEY DILATION;  Surgeon: Rogene Houston, MD;  [facility];  Service: Endoscopy;;   POLYPECTOMY  06/16/2020   Procedure: POLYPECTOMY;  Surgeon: Rogene Houston, MD;  [facility]: [facility];  Service: Endoscopy;;   REMOVAL OF GREAT TOENAIL  2005   SPLENECTOMY  2002   Family History  Problem Relation Age of Onset   Diabetes Mother    Hypertension Mother    Hyperlipidemia Mother    Lung disease Father    Hypertension Sister    Hyperlipidemia Sister    Hypertension Brother    Sickle cell trait Son    Arthritis Other    Asthma Other    Cancer Other    Social History   Socioeconomic History   Marital status: Divorced    Spouse name: Not on file   Number of children: 1   Years of education: Not on file   Highest education level: Not on file  Occupational History   Occupation: DISABLED   Tobacco Use   Smoking status: Never   Smokeless tobacco: Never  Vaping Use   Vaping Use: Never used  Substance and Sexual Activity   Alcohol use: No   Drug use: No   Sexual activity: Yes    Birth control/protection: Post-menopausal  Other Topics Concern   Not on file  Social History Narrative   Not on file   Social Determinants of Health   Financial Resource Strain: Low Risk    Difficulty of Paying Living Expenses: Not very hard  Food Insecurity: No Food Insecurity   Worried About Running Out of Food in the Last Year: Never true   Ran Out of Food in the Last Year: Never true  Transportation Needs: No Transportation Needs   Lack of Transportation (Medical): No   Lack of Transportation (Non-Medical): No  Physical Activity: Insufficiently Active   Days of Exercise per Week: 3 days   Minutes of Exercise per Session: 30 min  Stress: No Stress Concern Present   Feeling of Stress : Not at all  Social Connections: Moderately Isolated   Frequency of Communication with Friends and Family: More than three times a week   Frequency of Social Gatherings with Friends and  Family: More than three times a week   Attends Religious Services: 1 to 4 times per year   Active Member of [employer] or Organizations: No   Attends Music therapist: Never   Marital Status: Divorced    Tobacco Counseling Counseling given: Not Answered   Clinical Intake:  Pre-visit preparation completed: Yes  Pain : No/denies pain     Nutritional Risks: None Diabetes: Yes CBG done?: No Did pt. bring in CBG monitor from home?: No  How often do you need to have someone help you when you read instructions, pamphlets, or other written materials from your doctor or pharmacy?: 1 - Never  Diabetic?Yes Nutrition Risk Assessment:  Has the patient had any N/V/D within the last 2 months?  No  Does the patient have any non-healing wounds?  No  Has the patient had any unintentional weight loss or weight gain?  No   Diabetes:  Is the patient diabetic?  Yes  If diabetic, was a CBG obtained today?  No  Did the patient bring in their glucometer from home?  No  How often do you monitor your CBG's? Patient states does not check glucose at home.   Financial Strains and Diabetes Management:  Are you having any financial strains with the device, your supplies or your medication? No .  Does the patient want to be seen by Chronic Care Management for management of their diabetes?  No  Would the patient like to be referred to a Nutritionist or for Diabetic Management?  No   Diabetic Exams:  Diabetic Eye Exam: Completed 06/27/2020 Diabetic Foot Exam: Completed 08/30/2020      Information entered by :: [facility] of Daily Living In your present state of health, do you have any difficulty performing the following activities: 05/17/2021  Hearing? N  Vision? N  Difficulty concentrating or making decisions? N  Walking or climbing stairs? N  Dressing or bathing? N  Doing errands, shopping? N  Preparing Food and eating ? N  Using the Toilet? N  In the past six  months, have you accidently leaked urine? N  Do you have problems with loss of bowel control? N  Managing your Medications? N  Managing your Finances? N  Housekeeping or managing your Housekeeping? N  Some recent data might be hidden    Patient Care  Team: [name], MD as PCP - General Carole Civil, MD as Consulting Physician (Orthopedic Surgery)  Indicate any recent Medical Services you may have received from other than [facility] providers in the past year (date may be approximate).     Assessment:   This is a routine wellness examination for Tom Bean.  Hearing/Vision screen Vision Screening - Comments:: Patient states has not had eye exam in 2-3 years. Currently wears glasses   Dietary issues and exercise activities discussed: Current Exercise Habits: Home exercise routine, Type of exercise: walking;strength training/weights, Time (Minutes): 30, Frequency (Times/Week): 3, Weekly Exercise (Minutes/Week): 90, Intensity: Mild, Exercise limited by: None identified   Goals Addressed             This Visit's Progress    DIET - INCREASE WATER INTAKE   On track    HEMOGLOBIN A1C < 7         Depression Screen PHQ 2/9 Scores 05/17/2021 08/29/2020 05/10/2020 03/21/2020 12/24/2019 08/19/2019 07/23/2019  PHQ - 2 Score 0 0 1 0 0 0 0  PHQ- 9 Score - - - - - - -    Fall Risk Fall Risk  05/17/2021 08/29/2020 05/10/2020 05/09/2020 03/21/2020  Falls in the past year? 0 0 1 1 1   Number falls in past yr: 0 - 0 0 1  Injury with Fall? 0 - 0 0 1  Risk for fall due to : No Fall Risks - History of fall(s);Impaired balance/gait;Impaired mobility;Impaired vision;Orthopedic patient - Impaired mobility;Impaired balance/gait  Follow up Falls evaluation completed;Falls prevention discussed - Falls evaluation completed;Education provided;Falls prevention discussed - Falls prevention discussed    FALL RISK PREVENTION PERTAINING TO THE HOME:  Any stairs in or around the home? No  If so, are there any  without handrails? No  Home free of loose throw rugs in walkways, pet beds, electrical cords, etc? Yes  Adequate lighting in your home to reduce risk of falls? Yes   ASSISTIVE DEVICES UTILIZED TO PREVENT FALLS:  Life alert? No  Use of a cane, walker or w/c? No  Grab bars in the bathroom? No  Shower chair or bench in shower? No  Elevated toilet seat or a handicapped toilet? No    Cognitive Function:  Normal cognitive status assessed by direct observation by this Nurse Health Advisor. No abnormalities found.     6CIT Screen 05/09/2020 03/10/2019 06/17/2017  What Year? 0 points 0 points 0 points  What month? 0 points 0 points 0 points  What time? 0 points 0 points 0 points  Count back from 20 0 points 0 points 0 points  Months in reverse 0 points 0 points 0 points  Repeat phrase 4 points 6 points 0 points  Total Score 4 6 0    Immunizations Immunization History  Administered Date(s) Administered   Fluad Quad(high Dose 65+) 08/29/2020   H1N1 12/28/2008   Influenza Split 10/01/2011, 10/09/2012   Influenza Whole 08/13/2007, 09/06/2008, 08/24/2009, 11/21/2010   Influenza,inj,Quad PF,6+ Mos 09/10/2013, 08/11/2014, 08/02/2015, 09/13/2016, 10/29/2017   PPD Test 06/09/2013   Pneumococcal Conjugate-13 04/14/2015   Pneumococcal Polysaccharide-23 10/25/2004, 09/10/2013, 05/24/2020   Td 12/04/1995, 08/24/2009   Tdap 12/25/2012    TDAP status: Up to date  Flu Vaccine status: Up to date  Pneumococcal vaccine status: Up to date  Covid-19 vaccine status: Declined, Education has been provided regarding the importance of this vaccine but patient still declined. Advised may receive this vaccine at local pharmacy or Health Dept.or vaccine clinic. Aware to provide  a copy of the vaccination record if obtained from local pharmacy or Health Dept. Verbalized acceptance and understanding.  Qualifies for Shingles Vaccine? Yes   Zostavax completed No   Shingrix Completed?: No.    Education has been  provided regarding the importance of this vaccine. Patient has been advised to call insurance company to determine out of pocket expense if they have not yet received this vaccine. Advised may also receive vaccine at local pharmacy or Health Dept. Verbalized acceptance and understanding.  Screening Tests Health Maintenance  Topic Date Due   COVID-19 Vaccine (1) Never done   Zoster Vaccines- Shingrix (1 of 2) Never done   DEXA SCAN  02/02/2020   HEMOGLOBIN A1C  01/19/2021   MAMMOGRAM  08/30/2021 (Originally 01/14/2015)   OPHTHALMOLOGY EXAM  06/27/2021   INFLUENZA VACCINE  07/03/2021   FOOT EXAM  08/30/2021   TETANUS/TDAP  12/25/2022   COLONOSCOPY (Pts 45-44yrs Insurance coverage will need to be confirmed)  06/16/2030   Hepatitis C Screening  Completed   PNA vac Low Risk Adult  Completed   HPV VACCINES  Aged Out    Health Maintenance  Health Maintenance Due  Topic Date Due   COVID-19 Vaccine (1) Never done   Zoster Vaccines- Shingrix (1 of 2) Never done   DEXA SCAN  02/02/2020   HEMOGLOBIN A1C  01/19/2021    Colorectal cancer screening: Type of screening: Colonoscopy. Completed 06/16/2020. Repeat every 10 years  Mammogram status: Ordered 05/17/2021. Pt provided with contact info and advised to call to schedule appt.   Bone Density status: Ordered 08/29/2020. Pt provided with contact info and advised to call to schedule appt.  Lung Cancer Screening: (Low Dose CT Chest recommended if Age 40-80 years, 30 pack-year currently smoking OR have quit w/in 15years.) does not qualify.   Lung Cancer Screening Referral: N/A   Additional Screening:  Hepatitis C Screening: does qualify; Completed 10/26/2015  Vision Screening: Recommended annual ophthalmology exams for early detection of glaucoma and other disorders of the eye. Is the patient up to date with their annual eye exam?  No  Who is the provider or what is the name of the office in which the patient attends annual eye exams?  [facility]  If pt is not established with a provider, would they like to be referred to a provider to establish care? No .   Dental Screening: Recommended annual dental exams for proper oral hygiene  Community Resource Referral / Chronic Care Management: CRR required this visit?  No   CCM required this visit?  No      Plan:     I have personally reviewed and noted the following in the patient's chart:   Medical and social history Use of alcohol, tobacco or illicit drugs  Current medications and supplements including opioid prescriptions.  Functional ability and status Nutritional status Physical activity Advanced directives List of other physicians Hospitalizations, surgeries, and ER visits in previous 12 months Vitals Screenings to include cognitive, depression, and falls Referrals and appointments  In addition, I have reviewed and discussed with patient certain preventive protocols, quality metrics, and best practice recommendations. A written personalized care plan for preventive services as well as general preventive health recommendations were provided to patient.     Ofilia Neas, LPN   [id number]   Nurse Notes: None

## 2021-05-17 NOTE — Patient Instructions (Signed)
[name] , Thank you for taking time to come for your Medicare Wellness Visit. I appreciate your ongoing commitment to your health goals. Please review the following plan we discussed and let me know if I can assist you in the future.   Screening recommendations/referrals: Colonoscopy: No longer required Mammogram: Currently due orders placed this visit Bone Density: Currently due, orders placed on 08/29/2020 Recommended yearly ophthalmology/optometry visit for glaucoma screening and checkup Recommended yearly dental visit for hygiene and checkup  Vaccinations: Influenza vaccine: Up to date, next due fall 2022  Pneumococcal vaccine: Completed series  Tdap vaccine: Up to date, next due 12/25/2022 Shingles vaccine: Currently due for Shingrix, if you would like to receive we recommend that you do so at your local pharmacy    Advanced directives: Advance directive discussed with you today. Even though you declined this today please call our office should you change your mind and we can give you the proper paperwork for you to fill out.   Conditions/risks identified: None   Next appointment: 05/23/2022 @ 3:00 PM with [place] 66 Years and Older, Female Preventive care refers to lifestyle choices and visits with your health care provider that can promote health and wellness. What does preventive care include? A yearly physical exam. This is also called an annual well check. Dental exams once or twice a year. Routine eye exams. Ask your health care provider how often you should have your eyes checked. Personal lifestyle choices, including: Daily care of your teeth and gums. Regular physical activity. Eating a healthy diet. Avoiding tobacco and drug use. Limiting alcohol use. Practicing safe sex. Taking low-dose aspirin every day. Taking vitamin and mineral supplements as recommended by your health care provider. What happens during an annual well  check? The services and screenings done by your health care provider during your annual well check will depend on your age, overall health, lifestyle risk factors, and family history of disease. Counseling  Your health care provider may ask you questions about your: Alcohol use. Tobacco use. Drug use. Emotional well-being. Home and relationship well-being. Sexual activity. Eating habits. History of falls. Memory and ability to understand (cognition). Work and work Statistician. Reproductive health. Screening  You may have the following tests or measurements: Height, weight, and BMI. Blood pressure. Lipid and cholesterol levels. These may be checked every 5 years, or more frequently if you are over 72 years old. Skin check. Lung cancer screening. You may have this screening every year starting at age 81 if you have a 30-pack-year history of smoking and currently smoke or have quit within the past 15 years. Fecal occult blood test (FOBT) of the stool. You may have this test every year starting at age 29. Flexible sigmoidoscopy or colonoscopy. You may have a sigmoidoscopy every 5 years or a colonoscopy every 10 years starting at age 24. Hepatitis C blood test. Hepatitis B blood test. Sexually transmitted disease (STD) testing. Diabetes screening. This is done by checking your blood sugar (glucose) after you have not eaten for a while (fasting). You may have this done every 1-3 years. Bone density scan. This is done to screen for osteoporosis. You may have this done starting at age 39. Mammogram. This may be done every 1-2 years. Talk to your health care provider about how often you should have regular mammograms. Talk with your health care provider about your test results, treatment options, and if necessary, the need for more tests. Vaccines  Your  health care provider may recommend certain vaccines, such as: Influenza vaccine. This is recommended every year. Tetanus, diphtheria, and  acellular pertussis (Tdap, Td) vaccine. You may need a Td booster every 10 years. Zoster vaccine. You may need this after age 43. Pneumococcal 13-valent conjugate (PCV13) vaccine. One dose is recommended after age 44. Pneumococcal polysaccharide (PPSV23) vaccine. One dose is recommended after age 52. Talk to your health care provider about which screenings and vaccines you need and how often you need them. This information is not intended to replace advice given to you by your health care provider. Make sure you discuss any questions you have with your health care provider. Document Released: 12/16/2015 Document Revised: 08/08/2016 Document Reviewed: 09/20/2015 Elsevier Interactive Patient Education  2017 [place] Prevention in the Home Falls can cause injuries. They can happen to people of all ages. There are many things you can do to make your home safe and to help prevent falls. What can I do on the outside of my home? Regularly fix the edges of walkways and driveways and fix any cracks. Remove anything that might make you trip as you walk through a door, such as a raised step or threshold. Trim any bushes or trees on the path to your home. Use bright outdoor lighting. Clear any walking paths of anything that might make someone trip, such as rocks or tools. Regularly check to see if handrails are loose or broken. Make sure that both sides of any steps have handrails. Any raised decks and porches should have guardrails on the edges. Have any leaves, snow, or ice cleared regularly. Use sand or salt on walking paths during winter. Clean up any spills in your garage right away. This includes oil or grease spills. What can I do in the bathroom? Use night lights. Install grab bars by the toilet and in the tub and shower. Do not use towel bars as grab bars. Use non-skid mats or decals in the tub or shower. If you need to sit down in the shower, use a plastic, non-slip stool. Keep  the floor dry. Clean up any water that spills on the floor as soon as it happens. Remove soap buildup in the tub or shower regularly. Attach bath mats securely with double-sided non-slip rug tape. Do not have throw rugs and other things on the floor that can make you trip. What can I do in the bedroom? Use night lights. Make sure that you have a light by your bed that is easy to reach. Do not use any sheets or blankets that are too big for your bed. They should not hang down onto the floor. Have a firm chair that has side arms. You can use this for support while you get dressed. Do not have throw rugs and other things on the floor that can make you trip. What can I do in the kitchen? Clean up any spills right away. Avoid walking on wet floors. Keep items that you use a lot in easy-to-reach places. If you need to reach something above you, use a strong step stool that has a grab bar. Keep electrical cords out of the way. Do not use floor polish or wax that makes floors slippery. If you must use wax, use non-skid floor wax. Do not have throw rugs and other things on the floor that can make you trip. What can I do with my stairs? Do not leave any items on the stairs. Make sure that there are handrails  on both sides of the stairs and use them. Fix handrails that are broken or loose. Make sure that handrails are as long as the stairways. Check any carpeting to make sure that it is firmly attached to the stairs. Fix any carpet that is loose or worn. Avoid having throw rugs at the top or bottom of the stairs. If you do have throw rugs, attach them to the floor with carpet tape. Make sure that you have a light switch at the top of the stairs and the bottom of the stairs. If you do not have them, ask someone to add them for you. What else can I do to help prevent falls? Wear shoes that: Do not have high heels. Have rubber bottoms. Are comfortable and fit you well. Are closed at the toe. Do not  wear sandals. If you use a stepladder: Make sure that it is fully opened. Do not climb a closed stepladder. Make sure that both sides of the stepladder are locked into place. Ask someone to hold it for you, if possible. Clearly mark and make sure that you can see: Any grab bars or handrails. First and last steps. Where the edge of each step is. Use tools that help you move around (mobility aids) if they are needed. These include: Canes. Walkers. Scooters. Crutches. Turn on the lights when you go into a dark area. Replace any light bulbs as soon as they burn out. Set up your furniture so you have a clear path. Avoid moving your furniture around. If any of your floors are uneven, fix them. If there are any pets around you, be aware of where they are. Review your medicines with your doctor. Some medicines can make you feel dizzy. This can increase your chance of falling. Ask your doctor what other things that you can do to help prevent falls. This information is not intended to replace advice given to you by your health care provider. Make sure you discuss any questions you have with your health care provider. Document Released: 09/15/2009 Document Revised: 04/26/2016 Document Reviewed: 12/24/2014 Elsevier Interactive Patient Education  2017 [employer].

## 2021-08-02 DIAGNOSIS — L0292 Furuncle, unspecified: Secondary | ICD-10-CM | POA: Diagnosis not present

## 2021-09-28 DIAGNOSIS — B3731 Acute candidiasis of vulva and vagina: Secondary | ICD-10-CM | POA: Diagnosis not present

## 2021-09-28 DIAGNOSIS — R350 Frequency of micturition: Secondary | ICD-10-CM | POA: Diagnosis not present

## 2021-09-28 DIAGNOSIS — R739 Hyperglycemia, unspecified: Secondary | ICD-10-CM | POA: Diagnosis not present

## 2021-10-01 DIAGNOSIS — Z885 Allergy status to narcotic agent status: Secondary | ICD-10-CM | POA: Diagnosis not present

## 2021-10-01 DIAGNOSIS — Z743 Need for continuous supervision: Secondary | ICD-10-CM | POA: Diagnosis not present

## 2021-10-01 DIAGNOSIS — R739 Hyperglycemia, unspecified: Secondary | ICD-10-CM | POA: Diagnosis not present

## 2021-10-01 DIAGNOSIS — Z28310 Unvaccinated for covid-19: Secondary | ICD-10-CM | POA: Diagnosis not present

## 2021-10-01 DIAGNOSIS — E871 Hypo-osmolality and hyponatremia: Secondary | ICD-10-CM | POA: Diagnosis not present

## 2021-10-01 DIAGNOSIS — Z20822 Contact with and (suspected) exposure to covid-19: Secondary | ICD-10-CM | POA: Diagnosis not present

## 2021-10-01 DIAGNOSIS — B964 Proteus (mirabilis) (morganii) as the cause of diseases classified elsewhere: Secondary | ICD-10-CM | POA: Diagnosis not present

## 2021-10-01 DIAGNOSIS — D72829 Elevated white blood cell count, unspecified: Secondary | ICD-10-CM | POA: Diagnosis not present

## 2021-10-01 DIAGNOSIS — Z794 Long term (current) use of insulin: Secondary | ICD-10-CM | POA: Diagnosis not present

## 2021-10-01 DIAGNOSIS — I10 Essential (primary) hypertension: Secondary | ICD-10-CM | POA: Diagnosis not present

## 2021-10-01 DIAGNOSIS — E1110 Type 2 diabetes mellitus with ketoacidosis without coma: Secondary | ICD-10-CM | POA: Diagnosis not present

## 2021-10-01 DIAGNOSIS — R6889 Other general symptoms and signs: Secondary | ICD-10-CM | POA: Diagnosis not present

## 2021-10-01 DIAGNOSIS — B3731 Acute candidiasis of vulva and vagina: Secondary | ICD-10-CM | POA: Diagnosis not present

## 2021-10-01 DIAGNOSIS — E1165 Type 2 diabetes mellitus with hyperglycemia: Secondary | ICD-10-CM | POA: Diagnosis not present

## 2021-10-01 DIAGNOSIS — R112 Nausea with vomiting, unspecified: Secondary | ICD-10-CM | POA: Diagnosis not present

## 2021-10-01 DIAGNOSIS — N3000 Acute cystitis without hematuria: Secondary | ICD-10-CM | POA: Diagnosis not present

## 2021-10-01 DIAGNOSIS — Z888 Allergy status to other drugs, medicaments and biological substances status: Secondary | ICD-10-CM | POA: Diagnosis not present

## 2021-10-10 ENCOUNTER — Inpatient Hospital Stay: Payer: Medicare HMO | Admitting: Family Medicine

## 2021-10-11 ENCOUNTER — Inpatient Hospital Stay: Payer: Medicare Other | Admitting: Nurse Practitioner

## 2021-10-13 ENCOUNTER — Inpatient Hospital Stay: Payer: Medicare Other | Admitting: Nurse Practitioner

## 2021-10-17 ENCOUNTER — Ambulatory Visit: Payer: Medicare Other | Admitting: Nurse Practitioner

## 2022-04-03 DIAGNOSIS — H2513 Age-related nuclear cataract, bilateral: Secondary | ICD-10-CM | POA: Diagnosis not present

## 2022-04-03 DIAGNOSIS — E119 Type 2 diabetes mellitus without complications: Secondary | ICD-10-CM | POA: Diagnosis not present

## 2022-04-03 LAB — HM DIABETES EYE EXAM

## 2022-05-28 ENCOUNTER — Telehealth: Payer: Self-pay | Admitting: Family Medicine

## 2022-05-28 ENCOUNTER — Ambulatory Visit (INDEPENDENT_AMBULATORY_CARE_PROVIDER_SITE_OTHER): Payer: Medicare Other

## 2022-05-28 DIAGNOSIS — Z0000 Encounter for general adult medical examination without abnormal findings: Secondary | ICD-10-CM | POA: Diagnosis not present

## 2022-05-28 NOTE — Telephone Encounter (Signed)
Pls call pt and let her know that I am recommending she get labs drawn and come in for visit, has not been in for 2 years. If she agrees pls order fasting CBC, lipid, hBA1C, tsh, vit  D and cmp and EGFR Pls also sched visit if she agrees

## 2022-05-28 NOTE — Progress Notes (Signed)
Subjective:   [name] is a 66 y.o. female who presents for Medicare Annual (Subsequent) preventive examination.  Review of Systems    I connected with  Lanier Prude on 05/28/22 by a audio enabled telemedicine application and verified that I am speaking with the correct person using two identifiers.  Patient Location: Home  Provider Location: Office/Clinic  I discussed the limitations of evaluation and management by telemedicine. The patient expressed understanding and agreed to proceed.  Cardiac Risk Factors include: advanced age (>6men, >66 women);diabetes mellitus     Objective:    There were no vitals filed for this visit. There is no height or weight on file to calculate BMI.     05/28/2022    2:25 PM 05/17/2021    3:22 PM 06/16/2020   12:25 PM 05/10/2020    4:17 PM 03/23/2020   12:15 PM 07/27/2019    6:18 PM 07/23/2019    5:54 PM  Advanced Directives  Does Patient Have a Medical Advance Directive? Yes Yes No Yes No No No  Type of Advance Directive Living will;Healthcare Power of [employer] Power of [place];Living will  Healthcare Power of [place];Living will     Does patient want to make changes to medical advance directive? Yes (Inpatient - patient defers changing a medical advance directive and declines information at this time) No - Patient declined  No - Patient declined     Copy of Healthcare Power of Attorney in Chart? No - copy requested No - copy requested  No - copy requested     Would patient like information on creating a medical advance directive?   No - Patient declined  No - Patient declined No - Patient declined No - Patient declined    Current Medications (verified) Outpatient Encounter Medications as of 05/28/2022  Medication Sig   alum & mag hydroxide-simeth (MAALOX/MYLANTA) 200-200-20 MG/5ML suspension Take 30 mLs by mouth every 6 (six) hours as needed for indigestion or heartburn.   bisacodyl (DULCOLAX) 5 MG EC tablet Take 5 mg by  mouth at bedtime.   Calcium Carb-Cholecalciferol (CALTRATE 600+D3 PO) Take 1 tablet by mouth in the morning and at bedtime.   calcium carbonate (TUMS - DOSED IN MG ELEMENTAL CALCIUM) 500 MG chewable tablet Chew 2 tablets by mouth 3 (three) times daily as needed for indigestion or heartburn.   cholecalciferol (VITAMIN D3) 10 MCG (400 UNIT) TABS tablet Take 400 Units by mouth daily.   COLOSTRUM PO Take 3 tablets by mouth daily before breakfast. Colostrum Plus Symbiotics   ECHINACEA PO Take 760 mg by mouth daily.   Ferrous Sulfate (IRON PO) Take 25 mg by mouth 3 (three) times a week.   Garlic 1000 MG CAPS Take 1,000 mg by mouth at bedtime.   Multiple Vitamin (MULTIVITAMIN WITH MINERALS) TABS tablet Take 1 tablet by mouth daily.   Naphazoline-Glycerin (REDNESS RELIEF OP) Place 1 drop into both eyes 3 (three) times daily as needed (redness).    Omega-3 Fatty Acids (FISH OIL) 1000 MG CAPS Take 1,000 mg by mouth daily.   senna (SENOKOT) 8.6 MG tablet Take 2 tablets by mouth at bedtime.   UNABLE TO FIND Posture Cane x 1  DX R26.81   ACCU-CHEK GUIDE test strip USE 1 STRIP TO CHECK GLUCOSE TWICE DAILY (Patient not taking: Reported on 05/17/2021)   acetaminophen (TYLENOL) 650 MG CR tablet Take 1,300 mg by mouth every 8 (eight) hours as needed for pain. (Patient not taking: Reported on 05/17/2021)  clotrimazole-betamethasone (LOTRISONE) cream Apply 1 application topically 2 (two) times daily. (Patient not taking: Reported on 05/17/2021)   Ensure Plus (ENSURE PLUS) LIQD Take 237 mLs by mouth in the morning and at bedtime. (Patient not taking: Reported on 05/17/2021)   glipiZIDE (GLUCOTROL XL) 5 MG 24 hr tablet Take 1 tablet (5 mg total) by mouth daily with breakfast. (Patient not taking: Reported on 05/17/2021)   Lancets MISC 1 each by Does not apply route as directed. (Patient not taking: Reported on 05/17/2021)   ramipril (ALTACE) 2.5 MG capsule Take 1 capsule by mouth once daily (Patient not taking: Reported on  05/17/2021)   rosuvastatin (CRESTOR) 5 MG tablet Take 1 tablet (5 mg total) by mouth daily. (Patient not taking: Reported on 05/17/2021)   No facility-administered encounter medications on file as of 05/28/2022.    Allergies (verified) Citrus, Codeine, Milk-related compounds, Onion, and Pantoprazole   History: Past Medical History:  Diagnosis Date   Allergic rhinitis    ALLERGIC RHINITIS, SEASONAL 02/27/2008   Qualifier: Diagnosis of  By: Doreene Nest LPN, Jaime     Anemia, deficiency    Annual physical exam 08/15/2015   Diabetes mellitus    Educated about COVID-19 virus infection 05/01/2019   GERD 02/27/2008   Qualifier: Diagnosis of  By: Doreene Nest LPN, Marijean Niemann     GERD (gastroesophageal reflux disease)    Hyperlipidemia    Hypertension    Obesity    Post-menopausal bleeding 08/15/2015   Past Surgical History:  Procedure Laterality Date   BIOPSY  03/23/2020   Procedure: BIOPSY;  Surgeon: Malissa Hippo, MD;  [facility];  Service: Endoscopy;;  gastric   COLONOSCOPY N/A 06/16/2020   Procedure: COLONOSCOPY;  Surgeon: Malissa Hippo, MD;  [facility];  Service: Endoscopy;  Laterality: N/A;  220   ESOPHAGOGASTRODUODENOSCOPY (EGD) WITH PROPOFOL N/A 03/23/2020   Procedure: ESOPHAGOGASTRODUODENOSCOPY (EGD) WITH PROPOFOL;  Surgeon: Malissa Hippo, MD;  [facility]: [facility];  Service: Endoscopy;  Laterality: N/A;  125   EXTRACTION OF SEVENTH TEETH  2007   MALONEY DILATION  03/23/2020   Procedure: MALONEY DILATION;  Surgeon: Malissa Hippo, MD;  [facility]: [facility];  Service: Endoscopy;;   POLYPECTOMY  06/16/2020   Procedure: POLYPECTOMY;  Surgeon: Malissa Hippo, MD;  [facility];  Service: Endoscopy;;   REMOVAL OF GREAT TOENAIL  2005   SPLENECTOMY  2002   Family History  Problem Relation Age of Onset   Diabetes Mother    Hypertension Mother    Hyperlipidemia Mother    Lung disease Father    Hypertension Sister    Hyperlipidemia Sister     Hypertension Brother    Sickle cell trait Son    Arthritis Other    Asthma Other    Cancer Other    Social History   Socioeconomic History   Marital status: Divorced    Spouse name: Not on file   Number of children: 1   Years of education: Not on file   Highest education level: Not on file  Occupational History   Occupation: DISABLED   Tobacco Use   Smoking status: Never   Smokeless tobacco: Never  Vaping Use   Vaping Use: Never used  Substance and Sexual Activity   Alcohol use: No   Drug use: No   Sexual activity: Yes    Birth control/protection: Post-menopausal  Other Topics Concern   Not on file  Social History Narrative   Not on file  Social Determinants of Health   Financial Resource Strain: Low Risk  (05/28/2022)   Overall Financial Resource Strain (CARDIA)    Difficulty of Paying Living Expenses: Not hard at all  Food Insecurity: No Food Insecurity (05/28/2022)   Hunger Vital Sign    Worried About Running Out of Food in the Last Year: Never true    Ran Out of Food in the Last Year: Never true  Transportation Needs: No Transportation Needs (05/28/2022)   PRAPARE - Administrator, Civil Service (Medical): No    Lack of Transportation (Non-Medical): No  Physical Activity: Insufficiently Active (05/28/2022)   Exercise Vital Sign    Days of Exercise per Week: 3 days    Minutes of Exercise per Session: 20 min  Stress: No Stress Concern Present (05/28/2022)   [employer] of Occupational Health - Occupational Stress Questionnaire    Feeling of Stress : Only a little  Social Connections: Moderately Isolated (05/28/2022)   Social Connection and Isolation Panel [NHANES]    Frequency of Communication with Friends and Family: More than three times a week    Frequency of Social Gatherings with Friends and Family: More than three times a week    Attends Religious Services: More than 4 times per year    Active Member of [employer] or Organizations: No    Attends  Banker Meetings: Never    Marital Status: Divorced    Tobacco Counseling Counseling given: Not Answered   Clinical Intake:  Ms. Baden , Thank you for taking time to come for your Medicare Wellness Visit. I appreciate your ongoing commitment to your health goals. Please review the following plan we discussed and let me know if I can assist you in the future.   These are the goals we discussed:  Goals      DIET - EAT MORE FRUITS AND VEGETABLES     DIET - INCREASE WATER INTAKE     HEMOGLOBIN A1C < 7     Patient Stated     Workout more, help others more.     Prevent falls        This is a list of the screening recommended for you and due dates:  Health Maintenance  Topic Date Due   COVID-19 Vaccine (1) Never done   Zoster (Shingles) Vaccine (1 of 2) Never done   Mammogram  01/14/2015   DEXA scan (bone density measurement)  02/02/2020   Hemoglobin A1C  01/19/2021   Complete foot exam   08/30/2021   Flu Shot  07/03/2022   Tetanus Vaccine  12/25/2022   Eye exam for diabetics  04/04/2023   Colon Cancer Screening  06/16/2030   Pneumonia Vaccine  Completed   Hepatitis C Screening: USPSTF Recommendation to screen - Ages 18-79 yo.  Completed   HPV Vaccine  Aged Out    Pre-visit preparation completed: Yes  Pain : No/denies pain     BMI - recorded: 20.73 Nutritional Status: BMI of 19-24  Normal Nutritional Risks: None Diabetes: Yes CBG done?: No Did pt. bring in CBG monitor from home?: No  How often do you need to have someone help you when you read instructions, pamphlets, or other written materials from your doctor or pharmacy?: 1 - Never What is the last grade level you completed in school?: 12  Diabetic?yes Nutrition Risk Assessment:  Has the patient had any N/V/D within the last 2 months?  No  Does the patient have any non-healing wounds?  No  Has the patient had any unintentional weight loss or weight gain?  No   Diabetes:  Is the patient  diabetic?  Yes  If diabetic, was a CBG obtained today?  No  Did the patient bring in their glucometer from home?  No  How often do you monitor your CBG's? never.   Financial Strains and Diabetes Management:  Are you having any financial strains with the device, your supplies or your medication? No .  Does the patient want to be seen by Chronic Care Management for management of their diabetes?  No  Would the patient like to be referred to a Nutritionist or for Diabetic Management?  No   Diabetic Exams:  Diabetic Eye Exam: Completed 04/03/22 Diabetic Foot Exam: Completed 08/30/20    Interpreter Needed?: No      Activities of Daily Living    05/28/2022    2:25 PM  In your present state of health, do you have any difficulty performing the following activities:  Hearing? 0  Vision? 0  Difficulty concentrating or making decisions? 0  Walking or climbing stairs? 0  Dressing or bathing? 0  Doing errands, shopping? 0  Preparing Food and eating ? N  Using the Toilet? N  In the past six months, have you accidently leaked urine? N  Do you have problems with loss of bowel control? N  Managing your Medications? N  Managing your Finances? N  Housekeeping or managing your Housekeeping? N    Patient Care Team: Kerri Perches, MD as PCP - General Vickki Hearing, MD as Consulting Physician (Orthopedic Surgery)  Indicate any recent Medical Services you may have received from other than [facility] providers in the past year (date may be approximate).     Assessment:   This is a routine wellness examination for [place].  Hearing/Vision screen No results found.  Dietary issues and exercise activities discussed: Current Exercise Habits: Home exercise routine, Type of exercise: walking, Time (Minutes): 10, Frequency (Times/Week): 3, Weekly Exercise (Minutes/Week): 30, Intensity: Mild, Exercise limited by: None identified   Goals Addressed             This Visit's Progress     Patient Stated       Workout more, help others more.      Depression Screen    05/28/2022    2:25 PM 05/28/2022    2:22 PM 05/17/2021    3:25 PM 08/29/2020    3:34 PM 05/10/2020    4:00 PM 03/21/2020    1:26 PM 12/24/2019    3:34 PM  PHQ 2/9 Scores  PHQ - 2 Score 0 0 0 0 1 0 0    Fall Risk    05/28/2022    2:25 PM 05/17/2021    3:23 PM 08/29/2020    3:34 PM 05/10/2020    4:20 PM 05/09/2020    3:19 PM  Fall Risk   Falls in the past year? 0 0 0 1 1  Number falls in past yr: 0 0  0 0  Injury with Fall? 0 0  0 0  Risk for fall due to : No Fall Risks No Fall Risks  History of fall(s);Impaired balance/gait;Impaired mobility;Impaired vision;Orthopedic patient   Follow up Falls evaluation completed Falls evaluation completed;Falls prevention discussed  Falls evaluation completed;Education provided;Falls prevention discussed     FALL RISK PREVENTION PERTAINING TO THE HOME:  Any stairs in or around the home? Yes  If so, are there any without handrails? Yes  Home  free of loose throw rugs in walkways, pet beds, electrical cords, etc? Yes  Adequate lighting in your home to reduce risk of falls? Yes   ASSISTIVE DEVICES UTILIZED TO PREVENT FALLS:  Life alert? No  Use of a cane, walker or w/c? No  Grab bars in the bathroom? Yes  Shower chair or bench in shower? Yes  Elevated toilet seat or a handicapped toilet? Yes    Cognitive Function:    05/28/2022    2:26 PM  MMSE - Mini Mental State Exam  Not completed: Unable to complete        05/28/2022    2:26 PM 05/09/2020    3:17 PM 03/10/2019    8:32 AM 06/17/2017    4:13 PM  6CIT Screen  What Year? 0 points 0 points 0 points 0 points  What month? 0 points 0 points 0 points 0 points  What time? 0 points 0 points 0 points 0 points  Count back from 20 0 points 0 points 0 points 0 points  Months in reverse 0 points 0 points 0 points 0 points  Repeat phrase 0 points 4 points 6 points 0 points  Total Score 0 points 4 points 6 points 0 points     Immunizations Immunization History  Administered Date(s) Administered   Fluad Quad(high Dose 65+) 08/29/2020   H1N1 12/28/2008   Influenza Split 10/01/2011, 10/09/2012   Influenza Whole 08/13/2007, 09/06/2008, 08/24/2009, 11/21/2010   Influenza,inj,Quad PF,6+ Mos 09/10/2013, 08/11/2014, 08/02/2015, 09/13/2016, 10/29/2017   PPD Test 06/09/2013   Pneumococcal Conjugate-13 04/14/2015   Pneumococcal Polysaccharide-23 10/25/2004, 09/10/2013, 05/24/2020   Td 12/04/1995, 08/24/2009   Tdap 12/25/2012    TDAP status: Up to date  Flu Vaccine status: Up to date  Pneumococcal vaccine status: Up to date  Covid-19 vaccine status: Completed vaccines  Qualifies for Shingles Vaccine? Yes   Zostavax completed No   Shingrix Completed?: No.    Education has been provided regarding the importance of this vaccine. Patient has been advised to call insurance company to determine out of pocket expense if they have not yet received this vaccine. Advised may also receive vaccine at local pharmacy or Health Dept. Verbalized acceptance and understanding.  Screening Tests Health Maintenance  Topic Date Due   COVID-19 Vaccine (1) Never done   Zoster Vaccines- Shingrix (1 of 2) Never done   MAMMOGRAM  01/14/2015   DEXA SCAN  02/02/2020   HEMOGLOBIN A1C  01/19/2021   FOOT EXAM  08/30/2021   INFLUENZA VACCINE  07/03/2022   TETANUS/TDAP  12/25/2022   OPHTHALMOLOGY EXAM  04/04/2023   COLONOSCOPY (Pts 45-43yrs Insurance coverage will need to be confirmed)  06/16/2030   Pneumonia Vaccine 85+ Years old  Completed   Hepatitis C Screening  Completed   HPV VACCINES  Aged Out    Health Maintenance  Health Maintenance Due  Topic Date Due   COVID-19 Vaccine (1) Never done   Zoster Vaccines- Shingrix (1 of 2) Never done   MAMMOGRAM  01/14/2015   DEXA SCAN  02/02/2020   HEMOGLOBIN A1C  01/19/2021   FOOT EXAM  08/30/2021    Colorectal cancer screening: Type of screening: Colonoscopy. Completed  06/16/20. Repeat every 10 years  Mammogram status: Ordered  . Pt provided with contact info and advised to call to schedule appt.   Bone Density status: Ordered  . Pt provided with contact info and advised to call to schedule appt.  Lung Cancer Screening: (Low Dose CT Chest recommended if Age 66-80  years, 30 pack-year currently smoking OR have quit w/in 15years.) does not qualify.   Lung Cancer Screening Referral: no  Additional Screening:  Hepatitis C Screening: does qualify; Completed 10/26/15  Vision Screening: Recommended annual ophthalmology exams for early detection of glaucoma and other disorders of the eye. Is the patient up to date with their annual eye exam?  Yes  Who is the provider or what is the name of the office in which the patient attends annual eye exams? N/a If pt is not established with a provider, would they like to be referred to a provider to establish care? No .   Dental Screening: Recommended annual dental exams for proper oral hygiene  Community Resource Referral / Chronic Care Management: CRR required this visit?  No   CCM required this visit?  No      Plan:     I have personally reviewed and noted the following in the patient's chart:   Medical and social history Use of alcohol, tobacco or illicit drugs  Current medications and supplements including opioid prescriptions.  Functional ability and status Nutritional status Physical activity Advanced directives List of other physicians Hospitalizations, surgeries, and ER visits in previous 12 months Vitals Screenings to include cognitive, depression, and falls Referrals and appointments  In addition, I have reviewed and discussed with patient certain preventive protocols, quality metrics, and best practice recommendations. A written personalized care plan for preventive services as well as general preventive health recommendations were provided to patient.     Jasper Riling, [place]   05/28/2022

## 2022-05-29 ENCOUNTER — Other Ambulatory Visit: Payer: Self-pay

## 2022-05-29 DIAGNOSIS — E782 Mixed hyperlipidemia: Secondary | ICD-10-CM

## 2022-05-29 DIAGNOSIS — E559 Vitamin D deficiency, unspecified: Secondary | ICD-10-CM

## 2022-05-29 DIAGNOSIS — E1140 Type 2 diabetes mellitus with diabetic neuropathy, unspecified: Secondary | ICD-10-CM

## 2022-06-07 ENCOUNTER — Ambulatory Visit: Payer: Medicare Other | Admitting: Family Medicine

## 2022-06-08 ENCOUNTER — Encounter: Payer: Self-pay | Admitting: Family Medicine

## 2022-06-08 ENCOUNTER — Ambulatory Visit (INDEPENDENT_AMBULATORY_CARE_PROVIDER_SITE_OTHER): Payer: Medicare Other | Admitting: Family Medicine

## 2022-06-08 VITALS — BP 144/82 | HR 70 | Ht 64.0 in | Wt 156.1 lb

## 2022-06-08 DIAGNOSIS — E559 Vitamin D deficiency, unspecified: Secondary | ICD-10-CM

## 2022-06-08 DIAGNOSIS — E782 Mixed hyperlipidemia: Secondary | ICD-10-CM | POA: Diagnosis not present

## 2022-06-08 DIAGNOSIS — E1165 Type 2 diabetes mellitus with hyperglycemia: Secondary | ICD-10-CM | POA: Diagnosis not present

## 2022-06-08 DIAGNOSIS — B369 Superficial mycosis, unspecified: Secondary | ICD-10-CM | POA: Diagnosis not present

## 2022-06-08 DIAGNOSIS — Z780 Asymptomatic menopausal state: Secondary | ICD-10-CM

## 2022-06-08 DIAGNOSIS — I10 Essential (primary) hypertension: Secondary | ICD-10-CM | POA: Diagnosis not present

## 2022-06-08 DIAGNOSIS — E1140 Type 2 diabetes mellitus with diabetic neuropathy, unspecified: Secondary | ICD-10-CM | POA: Diagnosis not present

## 2022-06-08 DIAGNOSIS — C50911 Malignant neoplasm of unspecified site of right female breast: Secondary | ICD-10-CM | POA: Diagnosis not present

## 2022-06-08 DIAGNOSIS — Z91199 Patient's noncompliance with other medical treatment and regimen due to unspecified reason: Secondary | ICD-10-CM | POA: Diagnosis not present

## 2022-06-08 LAB — POCT GLYCOSYLATED HEMOGLOBIN (HGB A1C): HbA1c, POC (controlled diabetic range): 13.2 % — AB (ref 0.0–7.0)

## 2022-06-08 LAB — GLUCOSE, POCT (MANUAL RESULT ENTRY): POC Glucose: 247 mg/dl — AB (ref 70–99)

## 2022-06-08 MED ORDER — GLUCOSE BLOOD VI STRP: ORAL_STRIP | 12 refills | Status: DC

## 2022-06-08 MED ORDER — OLMESARTAN MEDOXOMIL 20 MG PO TABS: 20.0000 mg | ORAL_TABLET | Freq: Every day | ORAL | 2 refills | Status: DC

## 2022-06-08 MED ORDER — CLOTRIMAZOLE-BETAMETHASONE 1-0.05 % EX CREA: 1.0000 | TOPICAL_CREAM | Freq: Two times a day (BID) | CUTANEOUS | 1 refills | Status: DC

## 2022-06-08 MED ORDER — LANCETS 30G MISC: 5 refills | Status: DC

## 2022-06-08 MED ORDER — INSULIN ASPART 100 UNIT/ML IJ SOLN
3.0000 [IU] | Freq: Once | INTRAMUSCULAR | Status: AC
  Administered 2022-06-08: 3 [IU] via SUBCUTANEOUS

## 2022-06-08 MED ORDER — BLOOD GLUCOSE METER KIT: PACK | 0 refills | Status: DC

## 2022-06-08 NOTE — Assessment & Plan Note (Signed)
Refusing treatment in 2023

## 2022-06-08 NOTE — Patient Instructions (Addendum)
Annual exam and re eval of blood pressure in 6 weeks, call if you need me sooner  Urgent referral to Endo  New for blood pressure is olmesartan 20 mg one daily  Nurse to refer for diabetic ed  Nurse check in office blood sugar please,  I will advise of need for regular insulin in office if needed, please find me with result  Nurse order diabetic meter and test strips for twice daily testing  Labs today, cBC, lipid, cmp and eGFr, tSH, Vit D and microalb  It is important that you exercise regularly at least 30 minutes 5 times a week. If you develop chest pain, have severe difficulty breathing, or feel very tired, stop exercising immediately and seek medical attention  Thanks for choosing [facility], we consider it a privelige to serve you.

## 2022-06-08 NOTE — Assessment & Plan Note (Signed)
,   start benicar 20 mg daily DASH diet and commitment to daily physical activity for a minimum of 30 minutes discussed and encouraged, as a part of hypertension management. The importance of attaining a healthy weight is also discussed.     06/08/2022    4:06 PM 06/08/2022    3:13 PM 06/08/2022    3:05 PM 08/29/2020    4:10 PM 08/29/2020    3:33 PM 07/19/2020    3:17 PM 07/19/2020    2:57 PM  BP/Weight  Systolic BP 825 189 842 103 128  118  Diastolic BP 82 80 75 70 79  78  Wt. (Lbs)   156.12  128.12 125 125.6  BMI   26.8 kg/m2  21.99 kg/m2 22.14 kg/m2 22.25 kg/m2

## 2022-06-08 NOTE — Progress Notes (Signed)
[name]     MRN: [id number]      DOB: 1956/03/03   HPI [name] is here for follow up and re-evaluation of chronic medical conditions, medication management and review of any available recent lab and radiology data.  Preventive health is updated, specifically  Cancer screening and Immunization.  Refuses mammogram 1 week h/o rash on right back  Currently on no prescription medication, only takes herbal supplements by choice Denies polyuria, polydipsia, blurred vision , or hypoglycemic episodes.   ROS Denies recent fever or chills. Denies sinus pressure, nasal congestion, ear pain or sore throat. Denies chest congestion, productive cough or wheezing. Denies chest pains, palpitations and leg swelling Denies abdominal pain, nausea, vomiting,diarrhea or constipation.   Denies dysuria, frequency, hesitancy or incontinence. Denies joint pain, swelling and limitation in mobility. Denies headaches, seizures, numbness, or tingling. Denies depression, anxiety or insomnia.  PE  BP 138/80 (BP Location: Left Arm, Cuff Size: Normal)   Pulse 70   Ht '5\' 4"'$  (1.626 m)   Wt 156 lb 1.9 oz (70.8 kg)   SpO2 93%   BMI 26.80 kg/m   Patient alert and oriented and in no cardiopulmonary distress.  HEENT: No facial asymmetry, EOMI,     Neck supple .  Chest: Clear to auscultation bilaterally.  CVS: S1, S2 no murmurs, no S3.Regular rate.  ABD: Soft non tender.   Ext: No edema  MS: Adequate ROM spine, shoulders, hips and knees.  Skin: Intact, no ulcerations or rash noted.  Psych: Good eye contact, normal affect. Memory intact not anxious or depressed appearing.  CNS: CN 2-12 intact, power,  normal throughout.no focal deficits noted.   Assessment & Plan  Essential hypertension , start benicar 20 mg daily DASH diet and commitment to daily physical activity for a minimum of 30 minutes discussed and encouraged, as a part of hypertension management. The importance of attaining a  healthy weight is also discussed.     06/08/2022    4:06 PM 06/08/2022    3:13 PM 06/08/2022    3:05 PM 08/29/2020    4:10 PM 08/29/2020    3:33 PM 07/19/2020    3:17 PM 07/19/2020    2:57 PM  BP/Weight  Systolic BP 948 546 270 350 093  818  Diastolic BP 82 80 75 70 79  78  Wt. (Lbs)   156.12  128.12 125 125.6  BMI   26.8 kg/m2  21.99 kg/m2 22.14 kg/m2 22.25 kg/m2       Dermatomycosis Current flare x 2 weeks, med prescribed  Type 2 diabetes mellitus with hyperglycemia ([place]) Will refer for diabetic ed, and Endo eval and mx, she agrees, uncontrolled and on no med by choice for over 12 months [name] is reminded of the importance of commitment to daily physical activity for 30 minutes or more, as able and the need to limit carbohydrate intake to 30 to 60 grams per meal to help with blood sugar control.   The need to take medication as prescribed, test blood sugar as directed, and to call between visits if there is a concern that blood sugar is uncontrolled is also discussed.   [name] is reminded of the importance of daily foot exam, annual eye examination, and good blood sugar, blood pressure and cholesterol control. Random sugar over 200 in office , 3 units regular insulin given     Latest Ref Rng & Units 06/08/2022    4:05 PM 08/30/2020   10:00 AM 07/19/2020  3:09 PM 03/07/2020    2:45 PM 02/26/2020   10:40 AM  Diabetic Labs  HbA1c 0.0 - 7.0 % 13.2   6.8  7.5    Microalbumin Not Estab. ug/mL  29.9      Chol <200 mg/dL     167   HDL > OR = 50 mg/dL     52   Calc LDL mg/dL (calc)     99   Triglycerides <150 mg/dL     69   Creatinine 0.50 - 0.99 mg/dL     0.45       06/08/2022    4:06 PM 06/08/2022    3:13 PM 06/08/2022    3:05 PM 08/29/2020    4:10 PM 08/29/2020    3:33 PM 07/19/2020    3:17 PM 07/19/2020    2:57 PM  BP/Weight  Systolic BP 356 701 410 301 314  388  Diastolic BP 82 80 75 70 79  78  Wt. (Lbs)   156.12  128.12 125 125.6  BMI   26.8 kg/m2  21.99 kg/m2 22.14 kg/m2  22.25 kg/m2      Latest Ref Rng & Units 06/08/2022    3:00 PM 04/03/2022   12:00 AM  Foot/eye exam completion dates  Eye Exam No Retinopathy  No Retinopathy      Foot Form Completion  Done      This result is from an external source.        Personal history of noncompliance with medical treatment, presenting hazards to health Imaging and biopsy recommended over 3 years ago re abn mammogram has refused and continues to do so, states no palpbable breast mass or nipple d/c  Hyperlipidemia Hyperlipidemia:Low fat diet discussed and encouraged.   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

## 2022-06-08 NOTE — Assessment & Plan Note (Addendum)
Will refer for diabetic ed, and Endo eval and mx, she agrees, uncontrolled and on no med by choice for over 12 months [name] is reminded of the importance of commitment to daily physical activity for 30 minutes or more, as able and the need to limit carbohydrate intake to 30 to 60 grams per meal to help with blood sugar control.   The need to take medication as prescribed, test blood sugar as directed, and to call between visits if there is a concern that blood sugar is uncontrolled is also discussed.   [name] is reminded of the importance of daily foot exam, annual eye examination, and good blood sugar, blood pressure and cholesterol control. Random sugar over 200 in office , 3 units regular insulin given     Latest Ref Rng & Units 06/08/2022    4:05 PM 08/30/2020   10:00 AM 07/19/2020    3:09 PM 03/07/2020    2:45 PM 02/26/2020   10:40 AM  Diabetic Labs  HbA1c 0.0 - 7.0 % 13.2   6.8  7.5    Microalbumin Not Estab. ug/mL  29.9      Chol <200 mg/dL     167   HDL > OR = 50 mg/dL     52   Calc LDL mg/dL (calc)     99   Triglycerides <150 mg/dL     69   Creatinine 0.50 - 0.99 mg/dL     0.45       06/08/2022    4:06 PM 06/08/2022    3:13 PM 06/08/2022    3:05 PM 08/29/2020    4:10 PM 08/29/2020    3:33 PM 07/19/2020    3:17 PM 07/19/2020    2:57 PM  BP/Weight  Systolic BP 979 892 119 417 408  144  Diastolic BP 82 80 75 70 79  78  Wt. (Lbs)   156.12  128.12 125 125.6  BMI   26.8 kg/m2  21.99 kg/m2 22.14 kg/m2 22.25 kg/m2      Latest Ref Rng & Units 06/08/2022    3:00 PM 04/03/2022   12:00 AM  Foot/eye exam completion dates  Eye Exam No Retinopathy  No Retinopathy      Foot Form Completion  Done      This result is from an external source.

## 2022-06-08 NOTE — Assessment & Plan Note (Signed)
Imaging and biopsy recommended over 3 years ago re abn mammogram has refused and continues to do so, states no palpbable breast mass or nipple d/c

## 2022-06-08 NOTE — Assessment & Plan Note (Signed)
Current flare x 2 weeks, med prescribed

## 2022-06-08 NOTE — Assessment & Plan Note (Signed)
Hyperlipidemia:Low fat diet discussed and encouraged.   Lipid Panel  Lab Results  Component Value Date   CHOL 167 02/26/2020   HDL 52 02/26/2020   LDLCALC 99 02/26/2020   TRIG 69 02/26/2020   CHOLHDL 3.2 02/26/2020     Updated lab on day of visit

## 2022-06-10 LAB — VITAMIN D 25 HYDROXY (VIT D DEFICIENCY, FRACTURES): Vit D, 25-Hydroxy: 35.9 ng/mL (ref 30.0–100.0)

## 2022-06-10 LAB — CBC
Hematocrit: 40.2 % (ref 34.0–46.6)
Hemoglobin: 13 g/dL (ref 11.1–15.9)
MCH: 25.8 pg — ABNORMAL LOW (ref 26.6–33.0)
MCHC: 32.3 g/dL (ref 31.5–35.7)
MCV: 80 fL (ref 79–97)
Platelets: 151 10*3/uL (ref 150–450)
RBC: 5.03 x10E6/uL (ref 3.77–5.28)
RDW: 14.3 % (ref 11.7–15.4)
WBC: 10.5 10*3/uL (ref 3.4–10.8)

## 2022-06-10 LAB — MICROALBUMIN / CREATININE URINE RATIO
Creatinine, Urine: 135.9 mg/dL
Microalb/Creat Ratio: 33 mg/g creat — ABNORMAL HIGH (ref 0–29)
Microalbumin, Urine: 44.8 ug/mL

## 2022-06-10 LAB — CMP14+EGFR
ALT: 22 IU/L (ref 0–32)
AST: 22 IU/L (ref 0–40)
Albumin/Globulin Ratio: 1.5 (ref 1.2–2.2)
Albumin: 4.1 g/dL (ref 3.8–4.8)
Alkaline Phosphatase: 111 IU/L (ref 44–121)
BUN/Creatinine Ratio: 20 (ref 12–28)
BUN: 11 mg/dL (ref 8–27)
Bilirubin Total: 0.4 mg/dL (ref 0.0–1.2)
CO2: 23 mmol/L (ref 20–29)
Calcium: 9.1 mg/dL (ref 8.7–10.3)
Chloride: 99 mmol/L (ref 96–106)
Creatinine, Ser: 0.56 mg/dL — ABNORMAL LOW (ref 0.57–1.00)
Globulin, Total: 2.7 g/dL (ref 1.5–4.5)
Glucose: 246 mg/dL — ABNORMAL HIGH (ref 70–99)
Potassium: 4.1 mmol/L (ref 3.5–5.2)
Sodium: 135 mmol/L (ref 134–144)
Total Protein: 6.8 g/dL (ref 6.0–8.5)
eGFR: 100 mL/min/{1.73_m2} (ref >59)

## 2022-06-10 LAB — LIPID PANEL
Chol/HDL Ratio: 4.4 ratio (ref 0.0–4.4)
Cholesterol, Total: 183 mg/dL (ref 100–199)
HDL: 42 mg/dL (ref >39)
LDL Chol Calc (NIH): 121 mg/dL — ABNORMAL HIGH (ref 0–99)
Triglycerides: 112 mg/dL (ref 0–149)
VLDL Cholesterol Cal: 20 mg/dL (ref 5–40)

## 2022-06-10 LAB — TSH: TSH: 1.41 u[IU]/mL (ref 0.450–4.500)

## 2022-06-14 ENCOUNTER — Other Ambulatory Visit: Payer: Self-pay | Admitting: Family Medicine

## 2022-06-14 MED ORDER — DAPAGLIFLOZIN PROPANEDIOL 10 MG PO TABS: 10.0000 mg | ORAL_TABLET | Freq: Every day | ORAL | 0 refills | Status: DC

## 2022-06-14 MED ORDER — METFORMIN HCL 500 MG PO TABS: 500.0000 mg | ORAL_TABLET | Freq: Two times a day (BID) | ORAL | 0 refills | Status: DC

## 2022-06-14 MED ORDER — GLIPIZIDE ER 10 MG PO TB24
10.0000 mg | ORAL_TABLET | Freq: Every day | ORAL | 0 refills | Status: DC
Start: 2022-06-14 — End: 2022-07-03

## 2022-06-14 NOTE — Progress Notes (Signed)
One month only of 3 meds prescribed for uncontrolled diabetes, Endo to treat has appt in 07/2022 and understands and agrees that she will keep it

## 2022-07-03 ENCOUNTER — Encounter: Payer: Self-pay | Admitting: "Endocrinology

## 2022-07-03 ENCOUNTER — Ambulatory Visit (INDEPENDENT_AMBULATORY_CARE_PROVIDER_SITE_OTHER): Payer: Medicare Other | Admitting: "Endocrinology

## 2022-07-03 VITALS — BP 112/68 | HR 72 | Ht 64.0 in | Wt 153.4 lb

## 2022-07-03 DIAGNOSIS — E1165 Type 2 diabetes mellitus with hyperglycemia: Secondary | ICD-10-CM | POA: Diagnosis not present

## 2022-07-03 DIAGNOSIS — E782 Mixed hyperlipidemia: Secondary | ICD-10-CM | POA: Diagnosis not present

## 2022-07-03 DIAGNOSIS — Z91199 Patient's noncompliance with other medical treatment and regimen due to unspecified reason: Secondary | ICD-10-CM

## 2022-07-03 DIAGNOSIS — I10 Essential (primary) hypertension: Secondary | ICD-10-CM | POA: Diagnosis not present

## 2022-07-03 MED ORDER — SITAGLIPTIN PHOSPHATE 25 MG PO TABS: 25.0000 mg | ORAL_TABLET | Freq: Every day | ORAL | 1 refills | Status: DC

## 2022-07-03 MED ORDER — ACCU-CHEK GUIDE VI STRP: ORAL_STRIP | 2 refills | Status: DC

## 2022-07-03 MED ORDER — ACCU-CHEK GUIDE ME W/DEVICE KIT: 1.0000 | PACK | 0 refills | Status: DC

## 2022-07-03 NOTE — Patient Instructions (Signed)

## 2022-07-03 NOTE — Progress Notes (Signed)
07/03/2022, 6:32 PM   Endocrinology follow-up note    Subjective:    Patient ID: [name], female    DOB: 1956-05-02.  [name] is here for follow-up after she was seen in consultation for management of currently uncontrolled symptomatic diabetes requested by  [name], MD.   Past Medical History:  Diagnosis Date   Allergic rhinitis    ALLERGIC RHINITIS, SEASONAL 02/27/2008   Qualifier: Diagnosis of  By: Rebecca Eaton LPN, Jaime     Anemia, deficiency    Annual physical exam 08/15/2015   Diabetes mellitus    Educated about COVID-19 virus infection 05/01/2019   GERD 02/27/2008   Qualifier: Diagnosis of  By: Rebecca Eaton LPN, [place] Cerise     GERD (gastroesophageal reflux disease)    Hyperlipidemia    Hypertension    Obesity    Post-menopausal bleeding 08/15/2015    Past Surgical History:  Procedure Laterality Date   BIOPSY  03/23/2020   Procedure: BIOPSY;  Surgeon: Rogene Houston, MD;  [facility]: [facility];  Service: Endoscopy;;  gastric   COLONOSCOPY N/A 06/16/2020   Procedure: COLONOSCOPY;  Surgeon: Rogene Houston, MD;  Location: [facility];  Service: Endoscopy;  Laterality: N/A;  220   ESOPHAGOGASTRODUODENOSCOPY (EGD) WITH PROPOFOL N/A 03/23/2020   Procedure: ESOPHAGOGASTRODUODENOSCOPY (EGD) WITH PROPOFOL;  Surgeon: Rogene Houston, MD;  Location: [facility];  Service: Endoscopy;  Laterality: N/A;  125   EXTRACTION OF SEVENTH TEETH  2007   MALONEY DILATION  03/23/2020   Procedure: MALONEY DILATION;  Surgeon: Rogene Houston, MD;  Location: [facility];  Service: Endoscopy;;   POLYPECTOMY  06/16/2020   Procedure: POLYPECTOMY;  Surgeon: Rogene Houston, MD;  [facility]: [facility];  Service: Endoscopy;;   REMOVAL OF GREAT TOENAIL  2005   SPLENECTOMY  2002    Social History   Socioeconomic History   Marital status: Divorced    Spouse name: Not on file   Number of children: 1    Years of education: Not on file   Highest education level: Not on file  Occupational History   Occupation: DISABLED   Tobacco Use   Smoking status: Never   Smokeless tobacco: Never  Vaping Use   Vaping Use: Never used  Substance and Sexual Activity   Alcohol use: No   Drug use: No   Sexual activity: Yes    Birth control/protection: Post-menopausal  Other Topics Concern   Not on file  Social History Narrative   Not on file   Social Determinants of Health   Financial Resource Strain: Low Risk  (05/28/2022)   Overall Financial Resource Strain (CARDIA)    Difficulty of Paying Living Expenses: Not hard at all  Food Insecurity: No Food Insecurity (05/28/2022)   Hunger Vital Sign    Worried About Running Out of Food in the Last Year: Never true    [place] in the Last Year: Never true  Transportation Needs: No Transportation Needs (05/28/2022)   PRAPARE - Hydrologist (Medical): No    Lack of Transportation (Non-Medical): No  Physical Activity: Insufficiently  Active (05/28/2022)   Exercise Vital Sign    Days of Exercise per Week: 3 days    Minutes of Exercise per Session: 20 min  Stress: No Stress Concern Present (05/28/2022)   [place]    Feeling of Stress : Only a little  Social Connections: Moderately Isolated (05/28/2022)   Social Connection and Isolation Panel [NHANES]    Frequency of Communication with Friends and Family: More than three times a week    Frequency of Social Gatherings with Friends and Family: More than three times a week    Attends Religious Services: More than 4 times per year    Active Member of [employer] or Organizations: No    Attends Archivist Meetings: Never    Marital Status: Divorced    Family History  Problem Relation Age of Onset   Diabetes Mother    Hypertension Mother    Hyperlipidemia Mother    Lung disease Father    Hypertension  Sister    Hyperlipidemia Sister    Hypertension Brother    Sickle cell trait Son    Arthritis Other    Asthma Other    Cancer Other     Outpatient Encounter Medications as of 07/03/2022  Medication Sig   AZO CRANBERRY GUMMIES PO Take by mouth daily. Take 2 gummies daily   B Complex-C (SUPER B COMPLEX PO) Take by mouth daily.   Blood Glucose Monitoring Suppl (ACCU-CHEK GUIDE ME) w/Device KIT 1 Piece by Does not apply route as directed.   Calcium-Magnesium-Zinc 333-133-5 MG TABS Take 1 tablet by mouth daily.   famotidine (PEPCID) 20 MG tablet Take 20 mg by mouth daily.   glucose blood (ACCU-CHEK GUIDE) test strip Use as instructed to monitor glucose 4 times a day   guaifenesin (HUMIBID E) 400 MG TABS tablet Take 400 mg by mouth every 4 (four) hours.   Magnesium 400 MG TABS Take 1 tablet by mouth daily.   NON FORMULARY Take 1 tablet by mouth 3 (three) times daily. Blood sugar support   simethicone (MYLICON) 80 MG chewable tablet Chew 80 mg by mouth every 6 (six) hours as needed for flatulence.   sitaGLIPtin (JANUVIA) 25 MG tablet Take 1 tablet (25 mg total) by mouth daily.   TURMERIC CURCUMIN PO Take 1 tablet by mouth 2 (two) times daily.   ACCU-CHEK GUIDE test strip USE 1 STRIP TO CHECK GLUCOSE TWICE DAILY   acetaminophen (TYLENOL) 650 MG CR tablet Take 1,300 mg by mouth every 8 (eight) hours as needed for pain.   alum & mag hydroxide-simeth (MAALOX/MYLANTA) 200-200-20 MG/5ML suspension Take 30 mLs by mouth every 6 (six) hours as needed for indigestion or heartburn.   blood glucose meter kit and supplies Dispense based on patient and insurance preference. Twice daily testing DX E11.9   Calcium Carb-Cholecalciferol (CALTRATE 600+D3 PO) Take 1 tablet by mouth in the morning and at bedtime.   calcium carbonate (TUMS - DOSED IN MG ELEMENTAL CALCIUM) 500 MG chewable tablet Chew 2 tablets by mouth 3 (three) times daily as needed for indigestion or heartburn.   cholecalciferol (VITAMIN D3) 10 MCG  (400 UNIT) TABS tablet Take 400 Units by mouth daily.   clotrimazole-betamethasone (LOTRISONE) cream Apply 1 Application topically 2 (two) times daily.   COLOSTRUM PO Take 3 tablets by mouth daily before breakfast. Colostrum Plus Symbiotics   ECHINACEA PO Take 760 mg by mouth daily.   Ensure Plus (ENSURE PLUS) LIQD Take 237  mLs by mouth in the morning and at bedtime.   Garlic 7681 MG CAPS Take 1,000 mg by mouth at bedtime.   glucose blood test strip Use as instructed. Twice daily testing.   Lancets 30G MISC Twice daily testing dx e11.9   Lancets MISC 1 each by Does not apply route as directed.   Multiple Vitamin (MULTIVITAMIN WITH MINERALS) TABS tablet Take 1 tablet by mouth daily.   Naphazoline-Glycerin (REDNESS RELIEF OP) Place 1 drop into both eyes 3 (three) times daily as needed (redness).    olmesartan (BENICAR) 20 MG tablet Take 1 tablet (20 mg total) by mouth daily.   Omega-3 Fatty Acids (FISH OIL) 1000 MG CAPS Take 1,000 mg by mouth daily.   UNABLE TO FIND Posture Cane x 1  DX R26.81   [DISCONTINUED] dapagliflozin propanediol (FARXIGA) 10 MG TABS tablet Take 1 tablet (10 mg total) by mouth daily before breakfast. (Patient not taking: Reported on 07/03/2022)   [DISCONTINUED] Ferrous Sulfate (IRON PO) Take 25 mg by mouth 3 (three) times a week.   [DISCONTINUED] glipiZIDE (GLUCOTROL XL) 10 MG 24 hr tablet Take 1 tablet (10 mg total) by mouth daily with breakfast. (Patient not taking: Reported on 07/03/2022)   [DISCONTINUED] metFORMIN (GLUCOPHAGE) 500 MG tablet Take 1 tablet (500 mg total) by mouth 2 (two) times daily with a meal. (Patient not taking: Reported on 07/03/2022)   No facility-administered encounter medications on file as of 07/03/2022.    ALLERGIES: Allergies  Allergen Reactions   Citrus     In large amounts causes congestion / acid reflux    Codeine Itching   Milk-Related Compounds Other (See Comments)    Constipation    Onion     gas   Pantoprazole     Felt like I was  going to pass out    VACCINATION STATUS: Immunization History  Administered Date(s) Administered   Fluad Quad(high Dose 65+) 08/29/2020   H1N1 12/28/2008   Influenza Split 10/01/2011, 10/09/2012   Influenza Whole 08/13/2007, 09/06/2008, 08/24/2009, 11/21/2010   Influenza,inj,Quad PF,6+ Mos 09/10/2013, 08/11/2014, 08/02/2015, 09/13/2016, 10/29/2017   PPD Test 06/09/2013   Pneumococcal Conjugate-13 04/14/2015   Pneumococcal Polysaccharide-23 10/25/2004, 09/10/2013, 05/24/2020   Td 12/04/1995, 08/24/2009   Tdap 12/25/2012    Diabetes She presents for her follow-up diabetic visit. She has type 2 diabetes mellitus. Onset time: She was diagnosed at approximate age of 33 years. Her disease course has been worsening. There are no hypoglycemic associated symptoms. Pertinent negatives for hypoglycemia include no confusion, headaches, pallor or seizures. Associated symptoms include polydipsia and polyuria. Pertinent negatives for diabetes include no blurred vision, no chest pain, no fatigue and no polyphagia. There are no hypoglycemic complications. Symptoms are worsening. There are no diabetic complications. Risk factors for coronary artery disease include diabetes mellitus, post-menopausal, sedentary lifestyle and dyslipidemia. Current diabetic treatment includes oral agent (monotherapy). She is compliant with treatment none of the time. Her weight is increasing steadily. She is following a generally unhealthy diet. When asked about meal planning, she reported none. She has not had a previous visit with a dietitian. She participates in exercise three times a week. (She was seen in 2021 at which time her A1c was 7.5%.  Patient was supposed to return for follow-up, did not show up since August 2021.  Her previsit labs show A1c of 13.2%.  Patient denies polyuria, polydipsia, nor nocturia.   She is not on medications.  She was on glipizide the last time she was here.  She did not bring any meter nor logs to  review. She denies hypoglycemia.) An ACE inhibitor/angiotensin II receptor blocker is being taken. She does not see a podiatrist.Eye exam is not current.  Hyperlipidemia This is a chronic problem. The current episode started more than 1 year ago. The problem is uncontrolled. Recent lipid tests were reviewed and are high. Exacerbating diseases include diabetes. Pertinent negatives include no chest pain, myalgias or shortness of breath. Current antihyperlipidemic treatment includes herbal therapy. The current treatment provides mild improvement of lipids. There are no compliance problems.  Risk factors for coronary artery disease include dyslipidemia, diabetes mellitus, a sedentary lifestyle, post-menopausal, family history and hypertension.    Review of systems  Constitutional: slowly gaining wt she previously lost unintentially,   current  Body mass index is 26.33 kg/m. , no fatigue, no subjective hyperthermia, no subjective hypothermia Eyes: no blurry vision, no xerophthalmia ENT: no sore throat, no nodules palpated in throat, no dysphagia/odynophagia, no hoarseness    Objective:    BP 112/68   Pulse 72   Ht _0  (1.626 m)   Wt 153 lb 6.4 oz (69.6 kg)   BMI 26.33 kg/m   Wt Readings from Last 3 Encounters:  07/03/22 153 lb 6.4 oz (69.6 kg)  06/08/22 156 lb 1.9 oz (70.8 kg)  08/29/20 128 lb 1.9 oz (58.1 kg)    BP Readings from Last 3 Encounters:  07/03/22 112/68  06/08/22 (!) 144/82  08/29/20 120/70     Physical Exam- Limited  Constitutional:  Body mass index is 26.33 kg/m. , not in acute distress, normal state of mind, patient is reluctant, noncompliant affect. Eyes:  EOMI, no exophthalmos Neck: Supple Thyroid: No gross goiter    CMP ( most recent) CMP     Component Value Date/Time   NA 135 06/08/2022 1638   K 4.1 06/08/2022 1638   CL 99 06/08/2022 1638   CO2 23 06/08/2022 1638   GLUCOSE 246 (H) 06/08/2022 1638   GLUCOSE 190 (H) 02/26/2020 1040   BUN 11  06/08/2022 1638   CREATININE 0.56 (L) 06/08/2022 1638   CREATININE 0.45 (L) 02/26/2020 1040   CALCIUM 9.1 06/08/2022 1638   PROT 6.8 06/08/2022 1638   ALBUMIN 4.1 06/08/2022 1638   AST 22 06/08/2022 1638   ALT 22 06/08/2022 1638   ALKPHOS 111 06/08/2022 1638   BILITOT 0.4 06/08/2022 1638   GFRNONAA 105 02/26/2020 1040   GFRAA 122 02/26/2020 1040     Diabetic Labs (most recent): Lab Results  Component Value Date   HGBA1C 13.2 (A) 06/08/2022   HGBA1C 6.8 (A) 07/19/2020   HGBA1C 7.5 (H) 03/07/2020   MICROALBUR 29.9 (H) 08/30/2020   MICROALBUR <3.0 (H) 07/24/2019   MICROALBUR 3.9 10/30/2017     Lipid Panel ( most recent) Lipid Panel     Component Value Date/Time   CHOL 183 06/08/2022 1638   TRIG 112 06/08/2022 1638   HDL 42 06/08/2022 1638   CHOLHDL 4.4 06/08/2022 1638   CHOLHDL 3.2 02/26/2020 1040   VLDL 22 07/19/2017 1102   LDLCALC 121 (H) 06/08/2022 1638   LDLCALC 99 02/26/2020 1040   LDLCALC 94 12/07/2014 0000      Lab Results  Component Value Date   TSH 1.410 06/08/2022   TSH 1.25 03/07/2020   TSH 0.86 02/18/2017   TSH 2.126 04/14/2015   TSH 2.070 04/14/2014   TSH 2.190 04/15/2013   TSH 1.638 10/01/2011   TSH 1.831 03/09/2010   TSH 2.191 12/28/2008  Assessment & Plan:   1. Uncontrolled type 2 diabetes mellitus with hyperglycemia ([place])  - [name] has currently uncontrolled symptomatic type 2 DM since  66 years of age.  She was seen in 2021 at which time her A1c was 7.5%.  Patient was supposed to return for follow-up, did not show up since August 2021.  Her previsit labs show A1c of 13.2%.  Patient denies polyuria, polydipsia, nor nocturia.   She is not on medications.  She was on glipizide the last time she was here.   She did not bring any meter nor logs to review. She denies hypoglycemia.  - I had a long discussion with her about the progressive nature of diabetes and the pathology behind its complications. -her diabetes is complicated  by noncompliance and she remains at a high risk for more acute and chronic complications which include CAD, CVA, CKD, retinopathy, and neuropathy. These are all discussed in detail with her.  - The patient admits there is a room for improvement in their diet and drink choices. -  Suggestion is made for the patient to avoid simple carbohydrates from their diet including Cakes, Sweet Desserts / Pastries, Ice Cream, Soda (diet and regular), Sweet Tea, Candies, Chips, Cookies, Sweet Pastries,  Store Bought Juices, Alcohol in Excess of  1-2 drinks a day, Artificial Sweeteners, Coffee Creamer, and "Sugar-free" Products. This will help patient to have stable blood glucose profile and potentially avoid unintended weight gain.    - I encouraged the patient to switch to  unprocessed or minimally processed complex starch and increased protein intake (animal or plant source), fruits, and vegetables.   - Patient is advised to stick to a routine mealtimes to eat 3 meals  a day and avoid unnecessary snacks ( to snack only to correct hypoglycemia).  -Given her presentation with significant glycemic burden, she needed insulin treatment.  However, patient declined this offer.  She reports she does not tolerate glipizide either.  She wants to have a medication that her mother takes and that is Januvia.  I discussed and prescribed Januvia 25 mg p.o. daily at breakfast.    She is approached to start monitoring blood glucose 4 times a day-before meals and at bedtime and return in 1 week for reevaluation with her meter and logs.   She believes that she can treat her diabetes with supplements and vitamins.  I emphasized the fact that she will need more standard medications for her to achieve control of diabetes to target.  - she is encouraged to call clinic for blood glucose levels less than 70 or above 300 mg /dl.  I prescribed a new meter for her.  - Specific targets for  A1c;  LDL, HDL, Triglycerides, and  Waist  Circumference were discussed with the patient.  2) Blood Pressure /Hypertension: -Her blood pressure is controlled to target.  She is advised to continue Benicar 20 mg p.o. daily at breakfast.     3) Lipids/Hyperlipidemia:  Review of her recent lipid profile shows uncontrolled LDL at 121.  She is not taking her Crestor anymore.  She is taking omega-3 fatty acid 1000 mg p.o. daily and is advised to continue.  She will need whole food plant-based diet option if she would like to avoid statins.  This will be detailed during her next visit.  4)  Weight/Diet:  Her Body mass index is 26.33 kg/m. dropping from 27.  She is not a candidate for major weight loss.  5) Chronic Care/Health Maintenance:  -she  Is on ACEI/ARB  medications and  is encouraged to initiate and continue to follow up with Ophthalmology, Dentist,  Podiatrist at least yearly or according to recommendations, and advised to   stay away from smoking. I have recommended yearly flu vaccine and pneumonia vaccine at least every 5 years; moderate intensity exercise for up to 150 minutes weekly; and  sleep for at least 7 hours a day.  - she is  advised to maintain close follow up with [name], MD for primary care needs, as well as her other providers for optimal and coordinated care.   I spent 41 minutes in the care of the patient today including review of labs from [facility], Lipids, Thyroid Function, Hematology (current and previous including abstractions from other facilities); face-to-face time discussing hypoglycemia and hyperglycemia episodes and symptoms, medications doses, her options of short and long term treatment based on the latest standards of care / guidelines;  discussion about incorporating lifestyle medicine;  and documenting the encounter. Risk reduction counseling performed per USPSTF guidelines to reduce obesity and cardiovascular risk factors.     Please refer to Patient Instructions for Blood Glucose  Monitoring and Insulin/Medications Dosing Guide"  in media tab for additional information. Please  also refer to " Patient Self Inventory" in the Media  tab for reviewed elements of pertinent patient history.  [name] participated in the discussions, expressed understanding, and voiced agreement with the above plans.  All questions were answered to her satisfaction. she is encouraged to contact clinic should she have any questions or concerns prior to her return visit.    Follow up plan: - Return in about 1 week (around 07/10/2022) for F/U with Meter/CGM /Logs Only - no Labs.  Rayetta Pigg, FNP-BC [facility] Phone: [phone] Fax: [phone]    07/03/2022, 6:32 PM  This note was partially dictated with voice recognition software. Similar sounding words can be transcribed inadequately or may not  be corrected upon review.

## 2022-07-04 ENCOUNTER — Other Ambulatory Visit: Payer: Self-pay

## 2022-07-04 DIAGNOSIS — E1165 Type 2 diabetes mellitus with hyperglycemia: Secondary | ICD-10-CM

## 2022-07-04 MED ORDER — ACCU-CHEK GUIDE ME W/DEVICE KIT: PACK | 0 refills | Status: DC

## 2022-07-11 ENCOUNTER — Ambulatory Visit (INDEPENDENT_AMBULATORY_CARE_PROVIDER_SITE_OTHER): Payer: Medicare Other | Admitting: "Endocrinology

## 2022-07-11 ENCOUNTER — Encounter: Payer: Self-pay | Admitting: "Endocrinology

## 2022-07-11 VITALS — BP 100/68 | HR 80 | Ht 64.0 in | Wt 165.8 lb

## 2022-07-11 DIAGNOSIS — E1165 Type 2 diabetes mellitus with hyperglycemia: Secondary | ICD-10-CM | POA: Diagnosis not present

## 2022-07-11 DIAGNOSIS — E782 Mixed hyperlipidemia: Secondary | ICD-10-CM | POA: Diagnosis not present

## 2022-07-11 MED ORDER — SITAGLIPTIN PHOSPHATE 50 MG PO TABS: 50.0000 mg | ORAL_TABLET | Freq: Every day | ORAL | 1 refills | Status: DC

## 2022-07-11 NOTE — Patient Instructions (Signed)

## 2022-07-11 NOTE — Progress Notes (Signed)
07/11/2022, 7:16 PM   Endocrinology follow-up note    Subjective:    Patient ID: [name], female    DOB: 10/15/1956.  [name] is here for follow-up after she was seen in consultation for management of currently uncontrolled symptomatic diabetes requested by  [name], MD.   Past Medical History:  Diagnosis Date   Allergic rhinitis    ALLERGIC RHINITIS, SEASONAL 02/27/2008   Qualifier: Diagnosis of  By: Rebecca Eaton LPN, Jaime     Anemia, deficiency    Annual physical exam 08/15/2015   Diabetes mellitus    Educated about COVID-19 virus infection 05/01/2019   GERD 02/27/2008   Qualifier: Diagnosis of  By: Rebecca Eaton LPN, [place] Cerise     GERD (gastroesophageal reflux disease)    Hyperlipidemia    Hypertension    Obesity    Post-menopausal bleeding 08/15/2015    Past Surgical History:  Procedure Laterality Date   BIOPSY  03/23/2020   Procedure: BIOPSY;  Surgeon: Rogene Houston, MD;  [facility]: [facility];  Service: Endoscopy;;  gastric   COLONOSCOPY N/A 06/16/2020   Procedure: COLONOSCOPY;  Surgeon: Rogene Houston, MD;  [facility]: [facility];  Service: Endoscopy;  Laterality: N/A;  220   ESOPHAGOGASTRODUODENOSCOPY (EGD) WITH PROPOFOL N/A 03/23/2020   Procedure: ESOPHAGOGASTRODUODENOSCOPY (EGD) WITH PROPOFOL;  Surgeon: Rogene Houston, MD;  Location: [facility];  Service: Endoscopy;  Laterality: N/A;  125   EXTRACTION OF SEVENTH TEETH  2007   MALONEY DILATION  03/23/2020   Procedure: MALONEY DILATION;  Surgeon: Rogene Houston, MD;  [facility]: [facility];  Service: Endoscopy;;   POLYPECTOMY  06/16/2020   Procedure: POLYPECTOMY;  Surgeon: Rogene Houston, MD;  [facility]: [facility];  Service: Endoscopy;;   REMOVAL OF GREAT TOENAIL  2005   SPLENECTOMY  2002    Social History   Socioeconomic History   Marital status: Divorced    Spouse name: Not on file   Number of children: 1    Years of education: Not on file   Highest education level: Not on file  Occupational History   Occupation: DISABLED   Tobacco Use   Smoking status: Never   Smokeless tobacco: Never  Vaping Use   Vaping Use: Never used  Substance and Sexual Activity   Alcohol use: No   Drug use: No   Sexual activity: Yes    Birth control/protection: Post-menopausal  Other Topics Concern   Not on file  Social History Narrative   Not on file   Social Determinants of Health   Financial Resource Strain: Low Risk  (05/28/2022)   Overall Financial Resource Strain (CARDIA)    Difficulty of Paying Living Expenses: Not hard at all  Food Insecurity: No Food Insecurity (05/28/2022)   Hunger Vital Sign    Worried About Running Out of Food in the Last Year: Never true    [place] in the Last Year: Never true  Transportation Needs: No Transportation Needs (05/28/2022)   PRAPARE - Hydrologist (Medical): No    Lack of Transportation (Non-Medical): No  Physical Activity: Insufficiently  Active (05/28/2022)   Exercise Vital Sign    Days of Exercise per Week: 3 days    Minutes of Exercise per Session: 20 min  Stress: No Stress Concern Present (05/28/2022)   [place]    Feeling of Stress : Only a little  Social Connections: Moderately Isolated (05/28/2022)   Social Connection and Isolation Panel [NHANES]    Frequency of Communication with Friends and Family: More than three times a week    Frequency of Social Gatherings with Friends and Family: More than three times a week    Attends Religious Services: More than 4 times per year    Active Member of [employer] or Organizations: No    Attends Archivist Meetings: Never    Marital Status: Divorced    Family History  Problem Relation Age of Onset   Diabetes Mother    Hypertension Mother    Hyperlipidemia Mother    Lung disease Father    Hypertension  Sister    Hyperlipidemia Sister    Hypertension Brother    Sickle cell trait Son    Arthritis Other    Asthma Other    Cancer Other     Outpatient Encounter Medications as of 07/11/2022  Medication Sig   ACCU-CHEK GUIDE test strip USE 1 STRIP TO CHECK GLUCOSE TWICE DAILY   acetaminophen (TYLENOL) 650 MG CR tablet Take 1,300 mg by mouth every 8 (eight) hours as needed for pain.   alum & mag hydroxide-simeth (MAALOX/MYLANTA) 200-200-20 MG/5ML suspension Take 30 mLs by mouth every 6 (six) hours as needed for indigestion or heartburn.   AZO CRANBERRY GUMMIES PO Take by mouth daily. Take 2 gummies daily   B Complex-C (SUPER B COMPLEX PO) Take by mouth daily.   blood glucose meter kit and supplies Dispense based on patient and insurance preference. Twice daily testing DX E11.9   Blood Glucose Monitoring Suppl (ACCU-CHEK GUIDE ME) w/Device KIT USE TO CHECK BLOOD GLUCOSE FOUR TIMES DAILY AS DIRECTED   Calcium Carb-Cholecalciferol (CALTRATE 600+D3 PO) Take 1 tablet by mouth in the morning and at bedtime.   calcium carbonate (TUMS - DOSED IN MG ELEMENTAL CALCIUM) 500 MG chewable tablet Chew 2 tablets by mouth 3 (three) times daily as needed for indigestion or heartburn.   Calcium-Magnesium-Zinc 333-133-5 MG TABS Take 1 tablet by mouth daily.   cholecalciferol (VITAMIN D3) 10 MCG (400 UNIT) TABS tablet Take 400 Units by mouth daily.   clotrimazole-betamethasone (LOTRISONE) cream Apply 1 Application topically 2 (two) times daily.   COLOSTRUM PO Take 3 tablets by mouth daily before breakfast. Colostrum Plus Symbiotics   ECHINACEA PO Take 760 mg by mouth daily.   Ensure Plus (ENSURE PLUS) LIQD Take 237 mLs by mouth in the morning and at bedtime.   famotidine (PEPCID) 20 MG tablet Take 20 mg by mouth daily.   Garlic 1245 MG CAPS Take 1,000 mg by mouth at bedtime.   glucose blood (ACCU-CHEK GUIDE) test strip Use as instructed to monitor glucose 4 times a day   glucose blood test strip Use as instructed.  Twice daily testing.   guaifenesin (HUMIBID E) 400 MG TABS tablet Take 400 mg by mouth every 4 (four) hours.   Lancets 30G MISC Twice daily testing dx e11.9   Lancets MISC 1 each by Does not apply route as directed.   Magnesium 400 MG TABS Take 1 tablet by mouth daily.   Multiple Vitamin (MULTIVITAMIN WITH MINERALS) TABS tablet  Take 1 tablet by mouth daily.   Naphazoline-Glycerin (REDNESS RELIEF OP) Place 1 drop into both eyes 3 (three) times daily as needed (redness).    NON FORMULARY Take 1 tablet by mouth 3 (three) times daily. Blood sugar support   olmesartan (BENICAR) 20 MG tablet Take 1 tablet (20 mg total) by mouth daily.   Omega-3 Fatty Acids (FISH OIL) 1000 MG CAPS Take 1,000 mg by mouth daily.   simethicone (MYLICON) 80 MG chewable tablet Chew 80 mg by mouth every 6 (six) hours as needed for flatulence.   sitaGLIPtin (JANUVIA) 50 MG tablet Take 1 tablet (50 mg total) by mouth daily.   TURMERIC CURCUMIN PO Take 1 tablet by mouth 2 (two) times daily.   UNABLE TO FIND Posture Cane x 1  DX R26.81   [DISCONTINUED] Blood Glucose Monitoring Suppl (ACCU-CHEK GUIDE ME) w/Device KIT 1 Piece by Does not apply route as directed.   [DISCONTINUED] sitaGLIPtin (JANUVIA) 25 MG tablet Take 1 tablet (25 mg total) by mouth daily.   No facility-administered encounter medications on file as of 07/11/2022.    ALLERGIES: Allergies  Allergen Reactions   Citrus     In large amounts causes congestion / acid reflux    Codeine Itching   Milk-Related Compounds Other (See Comments)    Constipation    Onion     gas   Pantoprazole     Felt like I was going to pass out    VACCINATION STATUS: Immunization History  Administered Date(s) Administered   Fluad Quad(high Dose 65+) 08/29/2020   H1N1 12/28/2008   Influenza Split 10/01/2011, 10/09/2012   Influenza Whole 08/13/2007, 09/06/2008, 08/24/2009, 11/21/2010   Influenza,inj,Quad PF,6+ Mos 09/10/2013, 08/11/2014, 08/02/2015, 09/13/2016, 10/29/2017    PPD Test 06/09/2013   Pneumococcal Conjugate-13 04/14/2015   Pneumococcal Polysaccharide-23 10/25/2004, 09/10/2013, 05/24/2020   Td 12/04/1995, 08/24/2009   Tdap 12/25/2012    Diabetes She presents for her follow-up diabetic visit. She has type 2 diabetes mellitus. Onset time: She was diagnosed at approximate age of 67 years. Her disease course has been improving. There are no hypoglycemic associated symptoms. Pertinent negatives for hypoglycemia include no confusion, headaches, pallor or seizures. Associated symptoms include polydipsia and polyuria. Pertinent negatives for diabetes include no blurred vision, no chest pain, no fatigue and no polyphagia. There are no hypoglycemic complications. Symptoms are improving. There are no diabetic complications. Risk factors for coronary artery disease include diabetes mellitus, post-menopausal, sedentary lifestyle and dyslipidemia. Current diabetic treatment includes oral agent (monotherapy). She is compliant with treatment none of the time. Her weight is increasing steadily. She is following a generally unhealthy diet. When asked about meal planning, she reported none. She has not had a previous visit with a dietitian. She participates in exercise three times a week. Her home blood glucose trend is decreasing steadily. (She presents with her meter showing improvement in her glycemic profile, averaging 253 for the last 7 days.  However, she did not monitor her blood glucose adequately.  Her recent A1c was 13.2%.   Remarkably, patient denies polyuria, polydipsia, nor nocturia.   ) An ACE inhibitor/angiotensin II receptor blocker is being taken. She does not see a podiatrist.Eye exam is not current.  Hyperlipidemia This is a chronic problem. The current episode started more than 1 year ago. The problem is uncontrolled. Recent lipid tests were reviewed and are high. Exacerbating diseases include diabetes. Pertinent negatives include no chest pain, myalgias or  shortness of breath. Current antihyperlipidemic treatment includes herbal therapy. The  current treatment provides mild improvement of lipids. There are no compliance problems.  Risk factors for coronary artery disease include dyslipidemia, diabetes mellitus, a sedentary lifestyle, post-menopausal, family history and hypertension.    Review of systems  Constitutional: slowly gaining wt she previously lost unintentially,   current  Body mass index is 28.46 kg/m. , no fatigue, no subjective hyperthermia, no subjective hypothermia Eyes: no blurry vision, no xerophthalmia ENT: no sore throat, no nodules palpated in throat, no dysphagia/odynophagia, no hoarseness    Objective:    BP 100/68   Pulse 80   Ht '5\' 4"'  (1.626 m)   Wt 165 lb 12.8 oz (75.2 kg)   BMI 28.46 kg/m   Wt Readings from Last 3 Encounters:  07/11/22 165 lb 12.8 oz (75.2 kg)  07/03/22 153 lb 6.4 oz (69.6 kg)  06/08/22 156 lb 1.9 oz (70.8 kg)    BP Readings from Last 3 Encounters:  07/11/22 100/68  07/03/22 112/68  06/08/22 (!) 144/82     Physical Exam- Limited  Constitutional:  Body mass index is 28.46 kg/m. , not in acute distress, normal state of mind, patient is reluctant, noncompliant affect. Eyes:  EOMI, no exophthalmos Neck: Supple Thyroid: No gross goiter    CMP ( most recent) CMP     Component Value Date/Time   NA 135 06/08/2022 1638   K 4.1 06/08/2022 1638   CL 99 06/08/2022 1638   CO2 23 06/08/2022 1638   GLUCOSE 246 (H) 06/08/2022 1638   GLUCOSE 190 (H) 02/26/2020 1040   BUN 11 06/08/2022 1638   CREATININE 0.56 (L) 06/08/2022 1638   CREATININE 0.45 (L) 02/26/2020 1040   CALCIUM 9.1 06/08/2022 1638   PROT 6.8 06/08/2022 1638   ALBUMIN 4.1 06/08/2022 1638   AST 22 06/08/2022 1638   ALT 22 06/08/2022 1638   ALKPHOS 111 06/08/2022 1638   BILITOT 0.4 06/08/2022 1638   GFRNONAA 105 02/26/2020 1040   GFRAA 122 02/26/2020 1040     Diabetic Labs (most recent): Lab Results  Component Value  Date   HGBA1C 13.2 (A) 06/08/2022   HGBA1C 6.8 (A) 07/19/2020   HGBA1C 7.5 (H) 03/07/2020   MICROALBUR 29.9 (H) 08/30/2020   MICROALBUR <3.0 (H) 07/24/2019   MICROALBUR 3.9 10/30/2017     Lipid Panel ( most recent) Lipid Panel     Component Value Date/Time   CHOL 183 06/08/2022 1638   TRIG 112 06/08/2022 1638   HDL 42 06/08/2022 1638   CHOLHDL 4.4 06/08/2022 1638   CHOLHDL 3.2 02/26/2020 1040   VLDL 22 07/19/2017 1102   LDLCALC 121 (H) 06/08/2022 1638   LDLCALC 99 02/26/2020 1040   LDLCALC 94 12/07/2014 0000      Lab Results  Component Value Date   TSH 1.410 06/08/2022   TSH 1.25 03/07/2020   TSH 0.86 02/18/2017   TSH 2.126 04/14/2015   TSH 2.070 04/14/2014   TSH 2.190 04/15/2013   TSH 1.638 10/01/2011   TSH 1.831 03/09/2010   TSH 2.191 12/28/2008      Assessment & Plan:   1. Uncontrolled type 2 diabetes mellitus with hyperglycemia ([place])  - [name] has currently uncontrolled symptomatic type 2 DM since  66 years of age.  She presents with her meter showing improvement in her glycemic profile, averaging 253 for the last 7 days.  However, she did not monitor her blood glucose adequately.  Her recent A1c was 13.2%.   Remarkably, patient denies polyuria, polydipsia, nor nocturia.   -  I had a long discussion with her about the progressive nature of diabetes and the pathology behind its complications. -her diabetes is complicated by noncompliance and she remains at a high risk for more acute and chronic complications which include CAD, CVA, CKD, retinopathy, and neuropathy. These are all discussed in detail with her.   - The patient admits there is a room for improvement in their diet and drink choices. -  Suggestion is made for the patient to avoid simple carbohydrates from their diet including Cakes, Sweet Desserts / Pastries, Ice Cream, Soda (diet and regular), Sweet Tea, Candies, Chips, Cookies, Sweet Pastries,  Store Bought Juices, Alcohol in Excess of  1-2  drinks a day, Artificial Sweeteners, Coffee Creamer, and "Sugar-free" Products. This will help patient to have stable blood glucose profile and potentially avoid unintended weight gain.   - I encouraged the patient to switch to  unprocessed or minimally processed complex starch and increased protein intake (animal or plant source), fruits, and vegetables.   - Patient is advised to stick to a routine mealtimes to eat 3 meals  a day and avoid unnecessary snacks ( to snack only to correct hypoglycemia).  -Given her presentation with significant glycemic burden, she needed insulin treatment.  However, patient declined this offer.  She reports she does not tolerate glipizide either.  She is responding to low-dose Januvia.  I discussed and increase her Januvia to 50 mg p.o. daily at breakfast.  She is encouraged to continue monitoring blood glucose twice a day-daily before breakfast and at bedtime.    - she is encouraged to call clinic for blood glucose levels less than 70 or above 200 mg /dl.  I prescribed a new meter for her.  - Specific targets for  A1c;  LDL, HDL, Triglycerides, and  Waist Circumference were discussed with the patient.  2) Blood Pressure /Hypertension: -Her blood pressure is controlled to target.  She is advised to continue Benicar 20 mg p.o. daily at breakfast.     3) Lipids/Hyperlipidemia:  Review of her recent lipid profile shows uncontrolled LDL at 121.  She is not taking her Crestor anymore.  She is taking omega-3 fatty acid 1000 mg p.o. daily and is advised to continue.  She will need whole food plant-based diet option if she would like to avoid statins.  This will be detailed during her next visit.  4)  Weight/Diet:  Her Body mass index is 28.46 kg/m. dropping from 27.  She is not a candidate for major weight loss.     5) Chronic Care/Health Maintenance:  -she  Is on ACEI/ARB  medications and  is encouraged to initiate and continue to follow up with Ophthalmology,  Dentist,  Podiatrist at least yearly or according to recommendations, and advised to   stay away from smoking. I have recommended yearly flu vaccine and pneumonia vaccine at least every 5 years; moderate intensity exercise for up to 150 minutes weekly; and  sleep for at least 7 hours a day.  - she is  advised to maintain close follow up with [name], MD for primary care needs, as well as her other providers for optimal and coordinated care.   I spent 35 minutes in the care of the patient today including review of labs from [place], Lipids, Thyroid Function, Hematology (current and previous including abstractions from other facilities); face-to-face time discussing  her blood glucose readings/logs, discussing hypoglycemia and hyperglycemia episodes and symptoms, medications doses, her options of short and long  term treatment based on the latest standards of care / guidelines;  discussion about incorporating lifestyle medicine;  and documenting the encounter. Risk reduction counseling performed per USPSTF guidelines to reduce obesity and cardiovascular risk factors.     Please refer to Patient Instructions for Blood Glucose Monitoring and Insulin/Medications Dosing Guide"  in media tab for additional information. Please  also refer to " Patient Self Inventory" in the Media  tab for reviewed elements of pertinent patient history.  [name] in the discussions, expressed understanding, and voiced agreement with the above plans.  All questions were answered to her satisfaction. she is encouraged to contact clinic should she have any questions or concerns prior to her return visit.     Follow up plan: - Return in about 9 weeks (around 09/12/2022) for Bring Meter/CGM Device/Logs- A1c in Office.  Rayetta Pigg, FNP-BC [facility] Phone: [phone] Fax: [phone]    07/11/2022, 7:16 PM  This note was partially dictated with voice recognition  software. Similar sounding words can be transcribed inadequately or may not  be corrected upon review.

## 2022-07-19 ENCOUNTER — Encounter: Payer: Medicare Other | Admitting: Family Medicine

## 2022-09-12 ENCOUNTER — Ambulatory Visit: Payer: Medicare Other | Admitting: "Endocrinology

## 2022-10-15 DIAGNOSIS — J0100 Acute maxillary sinusitis, unspecified: Secondary | ICD-10-CM | POA: Diagnosis not present

## 2023-04-16 DIAGNOSIS — S5001XA Contusion of right elbow, initial encounter: Secondary | ICD-10-CM | POA: Diagnosis not present

## 2023-04-18 DIAGNOSIS — B029 Zoster without complications: Secondary | ICD-10-CM | POA: Diagnosis not present

## 2023-05-10 ENCOUNTER — Ambulatory Visit: Payer: 59 | Admitting: Internal Medicine

## 2023-05-14 ENCOUNTER — Ambulatory Visit: Payer: 59 | Admitting: Internal Medicine

## 2023-08-13 DIAGNOSIS — R42 Dizziness and giddiness: Secondary | ICD-10-CM | POA: Diagnosis not present

## 2023-08-13 DIAGNOSIS — U071 COVID-19: Secondary | ICD-10-CM | POA: Diagnosis not present

## 2023-08-13 DIAGNOSIS — Z20822 Contact with and (suspected) exposure to covid-19: Secondary | ICD-10-CM | POA: Diagnosis not present

## 2023-08-13 DIAGNOSIS — R82998 Other abnormal findings in urine: Secondary | ICD-10-CM | POA: Diagnosis not present

## 2023-08-13 DIAGNOSIS — R300 Dysuria: Secondary | ICD-10-CM | POA: Diagnosis not present

## 2023-08-14 DIAGNOSIS — R82998 Other abnormal findings in urine: Secondary | ICD-10-CM | POA: Diagnosis not present

## 2023-08-14 DIAGNOSIS — U071 COVID-19: Secondary | ICD-10-CM | POA: Diagnosis not present

## 2023-08-14 DIAGNOSIS — Z20822 Contact with and (suspected) exposure to covid-19: Secondary | ICD-10-CM | POA: Diagnosis not present

## 2023-08-14 DIAGNOSIS — R42 Dizziness and giddiness: Secondary | ICD-10-CM | POA: Diagnosis not present

## 2023-08-16 DIAGNOSIS — Z91018 Allergy to other foods: Secondary | ICD-10-CM | POA: Diagnosis not present

## 2023-08-16 DIAGNOSIS — H532 Diplopia: Secondary | ICD-10-CM | POA: Diagnosis not present

## 2023-08-16 DIAGNOSIS — E119 Type 2 diabetes mellitus without complications: Secondary | ICD-10-CM | POA: Diagnosis not present

## 2023-08-16 DIAGNOSIS — Z91011 Allergy to milk products: Secondary | ICD-10-CM | POA: Diagnosis not present

## 2023-08-16 DIAGNOSIS — H15002 Unspecified scleritis, left eye: Secondary | ICD-10-CM | POA: Diagnosis not present

## 2023-08-16 DIAGNOSIS — Z888 Allergy status to other drugs, medicaments and biological substances status: Secondary | ICD-10-CM | POA: Diagnosis not present

## 2023-08-16 DIAGNOSIS — Z885 Allergy status to narcotic agent status: Secondary | ICD-10-CM | POA: Diagnosis not present

## 2023-08-16 DIAGNOSIS — H109 Unspecified conjunctivitis: Secondary | ICD-10-CM | POA: Diagnosis not present

## 2023-08-16 DIAGNOSIS — R519 Headache, unspecified: Secondary | ICD-10-CM | POA: Diagnosis not present

## 2023-08-16 DIAGNOSIS — I10 Essential (primary) hypertension: Secondary | ICD-10-CM | POA: Diagnosis not present

## 2023-09-17 ENCOUNTER — Ambulatory Visit: Payer: 59 | Admitting: Family Medicine

## 2023-09-19 ENCOUNTER — Encounter: Payer: Self-pay | Admitting: Family Medicine

## 2023-10-14 ENCOUNTER — Telehealth: Payer: Self-pay

## 2023-10-14 NOTE — Patient Outreach (Signed)
Successful call to patient on today regarding preventative mammogram screening. Patient declined at this time and shared she has been medically advised to know longer participate in screenings.  Baruch Gouty [facility] Assistant [facility] Population [facility] [phone]

## 2023-11-19 ENCOUNTER — Ambulatory Visit: Payer: 59 | Admitting: Family Medicine

## 2023-11-28 DIAGNOSIS — Z79899 Other long term (current) drug therapy: Secondary | ICD-10-CM | POA: Diagnosis not present

## 2023-11-28 DIAGNOSIS — Z91018 Allergy to other foods: Secondary | ICD-10-CM | POA: Diagnosis not present

## 2023-11-28 DIAGNOSIS — N281 Cyst of kidney, acquired: Secondary | ICD-10-CM | POA: Diagnosis not present

## 2023-11-28 DIAGNOSIS — E871 Hypo-osmolality and hyponatremia: Secondary | ICD-10-CM | POA: Diagnosis not present

## 2023-11-28 DIAGNOSIS — Z885 Allergy status to narcotic agent status: Secondary | ICD-10-CM | POA: Diagnosis not present

## 2023-11-28 DIAGNOSIS — Z1152 Encounter for screening for COVID-19: Secondary | ICD-10-CM | POA: Diagnosis not present

## 2023-11-28 DIAGNOSIS — Z794 Long term (current) use of insulin: Secondary | ICD-10-CM | POA: Diagnosis not present

## 2023-11-28 DIAGNOSIS — M25561 Pain in right knee: Secondary | ICD-10-CM | POA: Diagnosis not present

## 2023-11-28 DIAGNOSIS — Z043 Encounter for examination and observation following other accident: Secondary | ICD-10-CM | POA: Diagnosis not present

## 2023-11-28 DIAGNOSIS — Z91011 Allergy to milk products: Secondary | ICD-10-CM | POA: Diagnosis not present

## 2023-11-28 DIAGNOSIS — E1110 Type 2 diabetes mellitus with ketoacidosis without coma: Secondary | ICD-10-CM | POA: Diagnosis not present

## 2023-11-28 DIAGNOSIS — K449 Diaphragmatic hernia without obstruction or gangrene: Secondary | ICD-10-CM | POA: Diagnosis not present

## 2023-11-28 DIAGNOSIS — Z7984 Long term (current) use of oral hypoglycemic drugs: Secondary | ICD-10-CM | POA: Diagnosis not present

## 2023-11-28 DIAGNOSIS — Z792 Long term (current) use of antibiotics: Secondary | ICD-10-CM | POA: Diagnosis not present

## 2023-11-28 DIAGNOSIS — Z888 Allergy status to other drugs, medicaments and biological substances status: Secondary | ICD-10-CM | POA: Diagnosis not present

## 2023-11-28 DIAGNOSIS — N390 Urinary tract infection, site not specified: Secondary | ICD-10-CM | POA: Diagnosis not present

## 2023-11-28 DIAGNOSIS — R109 Unspecified abdominal pain: Secondary | ICD-10-CM | POA: Diagnosis not present

## 2023-11-28 DIAGNOSIS — K439 Ventral hernia without obstruction or gangrene: Secondary | ICD-10-CM | POA: Diagnosis not present

## 2023-11-28 DIAGNOSIS — Z635 Disruption of family by separation and divorce: Secondary | ICD-10-CM | POA: Diagnosis not present

## 2023-11-28 DIAGNOSIS — Z20822 Contact with and (suspected) exposure to covid-19: Secondary | ICD-10-CM | POA: Diagnosis not present

## 2023-11-28 DIAGNOSIS — M1711 Unilateral primary osteoarthritis, right knee: Secondary | ICD-10-CM | POA: Diagnosis not present

## 2023-11-28 DIAGNOSIS — I10 Essential (primary) hypertension: Secondary | ICD-10-CM | POA: Diagnosis not present

## 2023-11-28 DIAGNOSIS — Z9081 Acquired absence of spleen: Secondary | ICD-10-CM | POA: Diagnosis not present

## 2023-11-28 DIAGNOSIS — N179 Acute kidney failure, unspecified: Secondary | ICD-10-CM | POA: Diagnosis not present

## 2023-11-29 DIAGNOSIS — M25561 Pain in right knee: Secondary | ICD-10-CM | POA: Diagnosis not present

## 2023-11-29 DIAGNOSIS — M1711 Unilateral primary osteoarthritis, right knee: Secondary | ICD-10-CM | POA: Diagnosis not present

## 2023-11-29 DIAGNOSIS — E1110 Type 2 diabetes mellitus with ketoacidosis without coma: Secondary | ICD-10-CM | POA: Diagnosis not present

## 2023-11-29 DIAGNOSIS — N179 Acute kidney failure, unspecified: Secondary | ICD-10-CM | POA: Insufficient documentation

## 2023-11-30 DIAGNOSIS — N390 Urinary tract infection, site not specified: Secondary | ICD-10-CM | POA: Diagnosis not present

## 2023-11-30 DIAGNOSIS — Z79899 Other long term (current) drug therapy: Secondary | ICD-10-CM | POA: Diagnosis not present

## 2023-11-30 DIAGNOSIS — Z792 Long term (current) use of antibiotics: Secondary | ICD-10-CM | POA: Diagnosis not present

## 2023-11-30 DIAGNOSIS — E1110 Type 2 diabetes mellitus with ketoacidosis without coma: Secondary | ICD-10-CM | POA: Diagnosis not present

## 2023-11-30 DIAGNOSIS — N179 Acute kidney failure, unspecified: Secondary | ICD-10-CM | POA: Diagnosis not present

## 2023-11-30 DIAGNOSIS — I10 Essential (primary) hypertension: Secondary | ICD-10-CM | POA: Diagnosis not present

## 2023-11-30 DIAGNOSIS — M25561 Pain in right knee: Secondary | ICD-10-CM | POA: Diagnosis not present

## 2023-12-02 ENCOUNTER — Telehealth: Payer: Self-pay

## 2023-12-02 ENCOUNTER — Telehealth: Payer: Self-pay | Admitting: "Endocrinology

## 2023-12-02 DIAGNOSIS — E1159 Type 2 diabetes mellitus with other circulatory complications: Secondary | ICD-10-CM

## 2023-12-02 DIAGNOSIS — E1165 Type 2 diabetes mellitus with hyperglycemia: Secondary | ICD-10-CM

## 2023-12-02 NOTE — Transitions of Care (Post Inpatient/ED Visit) (Signed)
12/02/2023  Name: [name] MRN: [id number] DOB: 06/10/1956  Today's TOC FU Call Status: Today's TOC FU Call Status:: Successful TOC FU Call Completed TOC FU Call Complete Date: 12/02/23 Patient's Name and Date of Birth confirmed.  Transition Care Management Follow-up Telephone Call Discharge Facility: Other ([facility]) Name of Other ([facility]) Discharge Facility: [facility] Type of Discharge: Inpatient Admission Primary Inpatient Discharge Diagnosis:: Diabetic ketoacidosis without coma associated with type 2 diabetes mellitus How have you been since you were released from the hospital?: Better Any questions or concerns?: Yes Patient Questions/Concerns:: New Glucometer One Touch. still learning how to use Patient Questions/Concerns Addressed: Other:  Items Reviewed: Did you receive and understand the discharge instructions provided?: Yes Medications obtained,verified, and reconciled?: Yes (Medications Reviewed) Any new allergies since your discharge?: No Dietary orders reviewed?: Yes Type of Diet Ordered:: Heart Healthy, Carb Modified Do you have support at home?: Yes People in Home: grandchild(ren) Name of Support/Comfort Primary Source: [name]  Medications Reviewed Today: Medications Reviewed Today     Reviewed by Johnnette Barrios, RN (Registered Nurse) on 12/02/23 at 1236  Med List Status: <None>   Medication Order Taking? Sig Documenting Provider Last Dose Status Informant  ACCU-CHEK GUIDE test strip [id number] Yes USE 1 STRIP TO CHECK GLUCOSE TWICE DAILY Nida, Denman George, MD Taking Active   acetaminophen (TYLENOL) 650 MG CR tablet [id number] Yes Take 1,300 mg by mouth every 8 (eight) hours as needed for pain. [provider] Taking Active Self  alum & mag hydroxide-simeth (MAALOX/MYLANTA) 200-200-20 MG/5ML suspension [id number] Yes Take 30 mLs by mouth every 6 (six) hours as needed for indigestion or heartburn. [provider] Taking Active  Self  Lavonna Rua GUMMIES PO [id number] Yes Take by mouth daily. Take 2 gummies daily [provider] Taking Active   B Complex-C (SUPER B COMPLEX PO) [id number] Yes Take by mouth daily. [provider] Taking Active   blood glucose meter kit and supplies [id number] Yes Dispense based on patient and insurance preference. Twice daily testing DX E11.9 Kerri Perches, MD Taking Active   Blood Glucose Monitoring Suppl (ACCU-CHEK GUIDE ME) w/Device KIT [id number] Yes USE TO CHECK BLOOD GLUCOSE FOUR TIMES DAILY AS DIRECTED Nida, Denman George, MD Taking Active   Calcium Carb-Cholecalciferol (CALTRATE 600+D3 PO) [id number] Yes Take 1 tablet by mouth in the morning and at bedtime. [provider] Taking Active Self  calcium carbonate (TUMS - DOSED IN MG ELEMENTAL CALCIUM) 500 MG chewable tablet [id number] Yes Chew 2 tablets by mouth 3 (three) times daily as needed for indigestion or heartburn. [provider] Taking Active Self  Calcium-Magnesium-Zinc [phone] MG TABS [id number] Yes Take 1 tablet by mouth daily. [provider] Taking Active   cholecalciferol (VITAMIN D3) 10 MCG (400 UNIT) TABS tablet [id number] Yes Take 400 Units by mouth daily. [provider] Taking Active Self  clotrimazole-betamethasone (LOTRISONE) cream [id number] Yes Apply 1 Application topically 2 (two) times daily. Kerri Perches, MD Taking Active   COLOSTRUM PO [id number] Yes Take 3 tablets by mouth daily before breakfast. Colostrum Plus Symbiotics [provider] Taking Active Self  ECHINACEA PO [id number] Yes Take 760 mg by mouth daily. [provider] Taking Active Self  Ensure Plus (ENSURE PLUS) LIQD [id number] No Take 237 mLs by mouth in the morning and at bedtime.  Patient not taking: Reported on 12/02/2023   [provider] Not Taking Active Self  famotidine (PEPCID) 20 MG tablet [id number] Yes Take 20 mg by mouth daily.  [provider] Taking Active   Garlic 1000 MG CAPS [id number] Yes Take 1,000 mg by mouth at bedtime. [provider] Taking Active Self  glucose blood (ACCU-CHEK GUIDE) test strip [id number] Yes Use as instructed to monitor glucose 4 times a day Roma Kayser, MD Taking Active   glucose blood test strip [id number] Yes Use as instructed. Twice daily testing. Kerri Perches, MD Taking Active   guaifenesin (HUMIBID E) 400 MG TABS tablet [id number] Yes Take 400 mg by mouth every 4 (four) hours. [provider] Taking Active   Lancets 30G MISC [id number] Yes Twice daily testing dx e11.9 Kerri Perches, MD Taking Active   Lancets MISC [id number] Yes 1 each by Does not apply route as directed. Roma Kayser, MD Taking Active Self  Magnesium 400 MG TABS [id number] Yes Take 1 tablet by mouth daily. [provider] Taking Active   Multiple Vitamin (MULTIVITAMIN WITH MINERALS) TABS tablet [id number] Yes Take 1 tablet by mouth daily. [provider] Taking Active Self  Naphazoline-Glycerin (REDNESS RELIEF OP) [id number] Yes Place 1 drop into both eyes 3 (three) times daily as needed (redness).  [provider] Taking Active Self  NON FORMULARY [id number]  Take 1 tablet by mouth 3 (three) times daily. Blood sugar support [provider]  Active   olmesartan (BENICAR) 20 MG tablet [id number] Yes Take 1 tablet (20 mg total) by mouth daily. Kerri Perches, MD Taking Active   Omega-3 Fatty Acids (FISH OIL) 1000 MG CAPS [id number] Yes Take 1,000 mg by mouth daily. [provider] Taking Active Self  simethicone (MYLICON) 80 MG chewable tablet [id number] Yes Chew 80 mg by mouth every 6 (six) hours as needed for flatulence. [provider] Taking Active   sitaGLIPtin (JANUVIA) 50 MG tablet [id number] No Take 1 tablet (50 mg total) by mouth daily.  Patient not taking: Reported on 12/02/2023   Roma Kayser, MD Not Taking Active    TURMERIC CURCUMIN PO [id number] Yes Take 1 tablet by mouth 2 (two) times daily. [provider] Taking Active   UNABLE TO FIND [id number]  Posture Cane x 1  DX R26.81 Kerri Perches, MD  Active Self            Home Care and Equipment/Supplies: Were Home Health Services Ordered?: Yes Name of Home Health Agency:: Unsure Has Agency set up a time to come to your home?: Yes First Home Health Visit Date: 12/02/23 Any new equipment or medical supplies ordered?: No (reports she needs a new BSC)  Functional Questionnaire: Do you need assistance with bathing/showering or dressing?: No Do you need assistance with meal preparation?: No Do you need assistance with eating?: No Do you have difficulty maintaining continence: No Do you need assistance with getting out of bed/getting out of a chair/moving?: No Do you have difficulty managing or taking your medications?: No  Follow up appointments reviewed: PCP Follow-up appointment confirmed?: No MD Provider Line Number:[phone] Given: No (she is waiting for office to call back with appointment) Specialist Hospital Follow-up appointment confirmed?: No Reason Specialist Follow-Up Not Confirmed: Patient has Specialist Provider Number and will Call for Appointment (Recc she cll and follow-up with Endrocrinologist) Do you need transportation to your follow-up appointment?: No (she drives)  SDOH Interventions Today    Flowsheet Row Most Recent Value  SDOH Interventions   Food Insecurity Interventions AMB Referral, Community Resources Provided  Housing Interventions AMB Referral  [needs home with no stairs or railing for  stairs]  Transportation Interventions Intervention Not Indicated, Patient Resources (Friends/Family)  Utilities Interventions AMB Referral  Social Connections Interventions Intervention Not Indicated       Goals Addressed             This Visit's Progress    TOC Care Plan       Current Barriers:   Financial constraints , Home Safety   RNCM Clinical Goal(s):  Patient will verbalize basic understanding of  DMII disease process and self health management plan as evidenced by monitoring Blood Glucose , taking medications as directed  take all medications exactly as prescribed and will call provider for medication related questions as evidenced by no missed appointments and no unmanaged adverse effects  attend all scheduled medical appointments: with PCP and Endocrinology as evidenced by no missed appointments  continue to work with RN Care Manager to address care management and care coordination needs related to  DMII as evidenced by adherence to CM Team Scheduled appointments work with pharmacist to address Financial constraints related to see notes , Limited access to food, Housing barriers, and Lacks knowledge of community resource: for food, housing assist and assistance with utilities Elevated BG -poorly controlled  related toDMII as evidenced by review or EMR and patient or pharmacist report through collaboration with Medical illustrator, provider, and care team.   Interventions: Evaluation of current treatment plan related to  self management and patient's adherence to plan as established by provider  Transitions of Care:  New goal. Annual Foot Exam advised patient to contact provider to schedule exam Community Resource Referral Made to address Housing Financial Strain Food Insecurity Doctor Visits  - discussed the importance of doctor visits Post discharge activity limitations prescribed by provider reviewed  Diabetes Interventions:  (Status:  New goal.) Short Term Goal Assessed patient's understanding of A1c goal: <8% Provided education to patient about basic DM disease process Reviewed medications with patient and discussed importance of medication adherence Counseled on importance of regular laboratory monitoring as prescribed Discussed plans with patient for ongoing care  management follow up and provided patient with direct contact information for care management team Reviewed scheduled/upcoming provider appointments including: PCP and Need for Endocrinology follow-up Referral made to social work team for assistance with Housing, Utilities Food/ Nutrition  Assessed social determinant of health barriers Lab Results  Component Value Date   HGBA1C 13.2 (A) 06/08/2022    Patient Goals/Self-Care Activities: Participate in Transition of Care Program/Attend TOC scheduled calls Take all medications as prescribed Attend all scheduled provider appointments Call pharmacy for medication refills 3-7 days in advance of running out of medications Attend church or other social activities Perform all self care activities independently  Perform IADL's (shopping, preparing meals, housekeeping, managing finances) independently Call provider office for new concerns or questions  Work with the social worker to address care coordination needs and will continue to work with the clinical team to address health care and disease management related needs  Follow Up Plan:  Telephone follow up appointment with care management team member scheduled for:  12/10/23 @ 1pm with Dixon Boos  The patient will call Endocrinology and agree to [facility] , schedule PCP follow-up  as advised to during this call .          Patient is at high risk for readmission and/or has history of  high utilization  Discussed VBCI  TOC program and weekly calls to patient to assess condition/status, medication management  and provide support/education as indicated . Patient/ Caregiver  voiced understanding and is  agreeable to 30 day program    Susa Loffler , Scientist, research (physical sciences), RN Care Management Coordinator [place]   [facility] [email or web address] Direct Dial: [phone]

## 2023-12-02 NOTE — Patient Instructions (Signed)
Visit Information  Thank you for taking time to visit with me today. Please don't hesitate to contact me if I can be of assistance to you before our next scheduled telephone appointment.  Your next appointment is by telephone on 12/10/23 at 1:00pm with Lonia Chimera   Following is a copy of your care plan:   Goals Addressed             This Visit's Progress    TOC Care Plan       Current Barriers:  Financial constraints , Home Safety   RNCM Clinical Goal(s):  Patient will verbalize basic understanding of  DMII disease process and self health management plan as evidenced by monitoring Blood Glucose , taking medications as directed  take all medications exactly as prescribed and will call provider for medication related questions as evidenced by no missed appointments and no unmanaged adverse effects  attend all scheduled medical appointments: with PCP and Endocrinology as evidenced by no missed appointments  continue to work with RN Care Manager to address care management and care coordination needs related to  DMII as evidenced by adherence to CM Team Scheduled appointments work with pharmacist to address Financial constraints related to see notes , Limited access to food, Housing barriers, and Lacks knowledge of community resource: for food, housing assist and assistance with utilities Elevated BG -poorly controlled  related toDMII as evidenced by review or EMR and patient or pharmacist report through collaboration with Medical illustrator, provider, and care team.   Interventions: Evaluation of current treatment plan related to  self management and patient's adherence to plan as established by provider  Transitions of Care:  New goal. Annual Foot Exam advised patient to contact provider to schedule exam Community Resource Referral Made to address Housing Financial Strain Food Insecurity Doctor Visits  - discussed the importance of doctor visits Post discharge activity limitations  prescribed by provider reviewed  Diabetes Interventions:  (Status:  New goal.) Short Term Goal Assessed patient's understanding of A1c goal: <8% Provided education to patient about basic DM disease process Reviewed medications with patient and discussed importance of medication adherence Counseled on importance of regular laboratory monitoring as prescribed Discussed plans with patient for ongoing care management follow up and provided patient with direct contact information for care management team Reviewed scheduled/upcoming provider appointments including: PCP and Need for Endocrinology follow-up Referral made to social work team for assistance with Housing, Utilities Food/ Nutrition  Assessed social determinant of health barriers Lab Results  Component Value Date   HGBA1C 13.2 (A) 06/08/2022    Patient Goals/Self-Care Activities: Participate in Transition of Care Program/Attend TOC scheduled calls Take all medications as prescribed Attend all scheduled provider appointments Call pharmacy for medication refills 3-7 days in advance of running out of medications Attend church or other social activities Perform all self care activities independently  Perform IADL's (shopping, preparing meals, housekeeping, managing finances) independently Call provider office for new concerns or questions  Work with the social worker to address care coordination needs and will continue to work with the clinical team to address health care and disease management related needs  Follow Up Plan:  Telephone follow up appointment with care management team member scheduled for:  12/10/23 @ 1pm with Dixon Boos  The patient will call Endocrinology and agree to [facility] , schedule PCP follow-up  as advised to during this call .           Reviewed goals for care Patient/ Caregiver  verbalizes  understanding of instructions and care plan provided. Patient / Caregiver was encouraged to make informed decisions about  care, actively participate in managing health conditions, and implement lifestyle changes as needed to promote independence and self-management of their medical care   VBCI Case Management Nurse will provide follow-up and on-going assessment ,evaluation and education of disease processes, recommended interventions for both chronic and acute medical conditions ,  along with ongoing review of symptoms ,medication reviews / reconciliation during each weekly call . Any updates , inconsistencies, discrepancies or acute care concerns will be addressed and routed to the correct Practitioner if indicated    Please call the care guide team at [phone]  if you need to cancel or reschedule your appointment . For scheduled calls -Three attempts will be made to reach you if the scheduled call is missed or  we are unable to reach the you  after 3 attempts the case will be closed and no additional outreach attempts will be made.   If you need to speak to a Nurse you may  call me directly at the number below or if I am unavailable,and  your need is urgent  please call the main VBCI number at [phone] and ask to speak with one of the [facility] ( Transition of Care )  Nurses  .     Additionally, If you experience worsening of your symptoms, develop shortness of breath, If you are experiencing a medical emergency,  develop suicidal or homicidal thoughts you must seek medical attention immediately by calling 911 or report to your local emergency department or urgent care.   If you have a non-emergency medical problem during routine business hours, please contact your provider's office and ask to speak with a nurse.       Please take the time to read instructions/literature along with the possible adverse reactions/side effects for all the Medicines that have been prescribed to you. Only take newly prescribed  Medications after you have completely understood and accept all the possible adverse reactions/side effects.    Do not take more than prescribed Medications for  Pain, Sleep and Anxiety. Do not drive when taking Pain medications or sleep aid/ insomnia  medications It is not advisable to combine anxiety, sleep and pain medications without talking with your primary care practitioner    If you are experiencing a Mental Health or Behavioral Health Crisis or need someone to talk to Please call the Suicide and Crisis Lifeline: 988 You may also call the [place] National Suicide Prevention Lifeline: (808)737-2312 or TTY: 646-252-3739 TTY (920)525-2970) to talk to a trained counselor.  You may call the Behavioral Health Crisis Line at 515 261 0196, at any time, 24 hours a day, 7 days a week- however If you are in danger or need immediate medical attention, call 911.   If you would like help to quit smoking, call 1-800-QUIT-NOW ( 830-656-9893) OR Espaol: 1-855-Djelo-Ya (7-564-332-9518) o para ms informacin haga clic aqu or Text READY to 841-660 to register via text.   Susa Loffler , BSN, RN Care Management Coordinator [place]   [facility] [email or web address] Direct Dial: [phone]

## 2023-12-02 NOTE — Telephone Encounter (Signed)
[facility] reached out in regards to patient needing to see [place] for her DM. Pt has not been here in a long time. I received her records and sent to media. Pt needs an overbook follow up for hospital admission f/u DKA. I called pt, no answer no VM set up.

## 2023-12-02 NOTE — Addendum Note (Signed)
Addended by: Johnnette Barrios on: 12/02/2023 01:58 PM   Modules accepted: Orders

## 2023-12-03 ENCOUNTER — Telehealth: Payer: Self-pay | Admitting: *Deleted

## 2023-12-03 NOTE — Progress Notes (Signed)
 Complex Care Management Note  Care Guide Note 12/03/2023 Name: [name] MRN: [id number] DOB: 1956/01/16  [name] is a 67 y.o. year old female who sees Antonetta Rollene BRAVO, MD for primary care. I reached out to Orlean VEAR Larve by phone today to offer complex care management services.  [name] was given information about Complex Care Management services today including:   The Complex Care Management services include support from the care team which includes your Nurse Coordinator, Clinical Social Worker, or Pharmacist.  The Complex Care Management team is here to help remove barriers to the health concerns and goals most important to you. Complex Care Management services are voluntary, and the patient may decline or stop services at any time by request to their care team member.   Complex Care Management Consent Status: Patient agreed to services and verbal consent obtained.   Follow up plan:  Telephone appointment with complex care management team member scheduled for:  12/06/2023  Encounter Outcome:  Patient Scheduled  Thedford Franks, [facility] Care Coordination Care Guide Direct Dial: [phone]

## 2023-12-03 NOTE — Telephone Encounter (Signed)
I called pt again VM FULL

## 2023-12-10 ENCOUNTER — Other Ambulatory Visit: Payer: Self-pay

## 2023-12-10 NOTE — Patient Outreach (Signed)
 Care Management  Transitions of Care Program Transitions of Care Post-discharge week 2   12/10/2023 Name: [name] MRN: [id number] DOB: 02-26-1956  Subjective: [name] is a 68 y.o. year old female who is a primary care patient of Antonetta Rollene BRAVO, MD. The Care Management team Engaged with patient Engaged with patient by telephone to assess and address transitions of care needs.   Consent to Services:  Patient was given information about care management services, agreed to services, and gave verbal consent to participate.   Assessment: Patient reports that she is fine. Reports CBG has been running 100.  I inquired about CBG today and patient states that she has not checked it.  I asked patient to check CBG while on the phone and she refused and said she was busy. Patient reports that she has not followed up with MD. Reports that she is not taking her medications. Reports that she is eating right exercising and drinking water .  Patient reports that she has lost a lot of family members in the last year.  Reports that she is leaving it all with the LORD.  Patient reports that she wants help moving.  Reports that she missed her appointment with BSW. Rescheduled for tomorrow.           SDOH Interventions    Flowsheet Row Telephone from 12/02/2023 in [facility] Clinical Support from 05/28/2022 in [facility] Clinical Support from 05/17/2021 in [facility]  SDOH Interventions     Food Insecurity Interventions AMB Referral, Community Resources Provided Intervention Not Indicated Intervention Not Indicated  Housing Interventions AMB Referral  [needs home with no stairs or railing for stairs] Intervention Not Indicated Intervention Not Indicated  Transportation Interventions Intervention Not Indicated, Patient Resources (Friends/Family) Intervention Not Indicated Intervention Not Indicated  Utilities  Interventions AMB Referral -- --  Financial Strain Interventions -- Intervention Not Indicated Intervention Not Indicated  Physical Activity Interventions -- Patient Refused Intervention Not Indicated  Stress Interventions -- Intervention Not Indicated Intervention Not Indicated  Social Connections Interventions Intervention Not Indicated Patient Refused Intervention Not Indicated        Goals Addressed             This Visit's Progress    COMPLETED: TOC Care Plan       Current Barriers:  Financial constraints , Home Safety 12/10/2023  Reports she wants to move.   RNCM Clinical Goal(s):  Patient will verbalize basic understanding of  DMII disease process and self health management plan as evidenced by monitoring Blood Glucose , taking medications as directed  take all medications exactly as prescribed and will call provider for medication related questions as evidenced by no missed appointments and no unmanaged adverse effects  attend all scheduled medical appointments: with PCP and Endocrinology as evidenced by no missed appointments  continue to work with RN Care Manager to address care management and care coordination needs related to  DMII as evidenced by adherence to CM Team Scheduled appointments work with pharmacist to address Financial constraints related to see notes , Limited access to food, Housing barriers, and Lacks knowledge of community resource: for food, housing assist and assistance with utilities Elevated BG -poorly controlled  related toDMII as evidenced by review or EMR and patient or pharmacist report through collaboration with Medical Illustrator, provider, and care team.   Interventions: Evaluation of current treatment plan related to  self management and patient's adherence to  plan as established by provider  Transitions of Care:  New goal. Annual Foot Exam advised patient to contact provider to schedule exam. 12/10/2023  Reviewed Community Resource Referral Made  to address Housing Financial Strain Food Insecurity Doctor Visits  - discussed the importance of doctor visits.  12/10/2023  Encouraged patient to follow up.  Post discharge activity limitations prescribed by provider reviewed  Diabetes Interventions:  (Status:  Goal on track:  NO.) Short Term Goal Assessed patient's understanding of A1c goal: <8%,  12/10/2023  Reviewed A1c of 13.1,  Encouraged patient to manage her DM by taking her medications and following her diet.  Provided education to patient about basic DM disease process Reviewed medications with patient and discussed importance of medication adherence.  12/10/2023   Encouraged patient to take her medications as prescribed.   Counseled on importance of regular laboratory monitoring as prescribed.  12/10/2023  Reviewed importance of monitoring.  Discussed plans with patient for ongoing care management follow up and provided patient with direct contact information for care management team Reviewed scheduled/upcoming provider appointments including: PCP and Need for Endocrinology follow-up.  12/10/2023  Reviewed with patient MD trying to make an appointment with her.  Referral made to social work team for assistance with Housing, Utilities Food/ Nutrition.  12/10/2023   Reviewed pending appointment. Lab Results  Component Value Date   HGBA1C 13.2 (A) 06/08/2022    Patient Goals/Self-Care Activities: Participate in Transition of Care Program/Attend TOC scheduled calls Take all medications as prescribed Attend all scheduled provider appointments Call pharmacy for medication refills 3-7 days in advance of running out of medications Attend church or other social activities Perform all self care activities independently  Perform IADL's (shopping, preparing meals, housekeeping, managing finances) independently Call provider office for new concerns or questions  Work with the social worker to address care coordination needs and will  continue to work with the clinical team to address health care and disease management related needs  Follow Up Plan:   Encouraged patient to call MD and to take her medications as prescribed. Patient declines further calls. States that JESUS will take care of her.         Plan: The patient will call MD* as advised to make a follow up appointment. SABRA Alan Ee, RN, BSN, CEN Population Health- Transition of Care Team.  [facility] [phone]

## 2023-12-11 ENCOUNTER — Ambulatory Visit: Payer: Self-pay

## 2023-12-11 NOTE — Patient Outreach (Signed)
  Care Coordination   Initial Visit Note   12/11/2023 Name: [name] MRN: [id number] DOB: 07/08/1956  [name] is a 68 y.o. year old female who sees Antonetta Rollene BRAVO, MD for primary care. I spoke with  Orlean VEAR Larve by phone today.  What matters to the patients health and wellness today?  Patient rents in a home that is gas heat and landlord request no electric heaters. Patient wants to move to find more affordable rent. Granddaughter works and helps a little. Patient has been looking but the rent is very high.  Sw provides [employer] and [employer] as a theatre stage manager for housing. Patient will have granddaughter call later for additional resources to be sent to her email.   Goals Addressed             This Visit's Progress    Care Coordination Activities       Interventions Today    Flowsheet Row Most Recent Value  Chronic Disease   Chronic disease during today's visit Diabetes, Hypertension (HTN)  General Interventions   General Interventions Discussed/Reviewed General Interventions Discussed, General Interventions Reviewed, [employer], Communication with, Doctor Visits  [Pt and adult grandaughter live together.Heating the home is difficult and rent is $1000. Pt wants to move. Pt receives Ucard, Foodstamps, LIEAP asst. Pt needs transportat due to car repairs. SW provided online options for housing.]  Doctor Visits Discussed/Reviewed --  Hipolito has not seen provider is over 1 year and is encourage to scheduled.]  Communication with --  [SW t/c [facility] and transportation is available through [employer] [phone] for 36 rides. Pt will call to schedule md apt and set up transportation.]              SDOH assessments and interventions completed:  Yes  SDOH Interventions Today    Flowsheet Row Most Recent Value  SDOH Interventions   Food Insecurity Interventions Intervention Not Indicated, Other (Comment)  [Received foodstamps $90, UCard $316]   Housing Interventions Intervention Not Indicated  Transportation Interventions Intervention Not Indicated, Other (Comment)  [Car needs repair, grandaughter when needed]  Utilities Interventions Intervention Not Indicated, Other (Comment)  [Assisted with the LIEAP program]        Care Coordination Interventions:  Yes, provided   Follow up plan: No further intervention required.   Encounter Outcome:  Patient Visit Completed

## 2023-12-11 NOTE — Patient Instructions (Signed)
 Visit Information  Thank you for taking time to visit with me today. Please don't hesitate to contact me if I can be of assistance to you.   Following are the goals we discussed today:  Patient will contact housing resources for affordable options. Patient will contact provider to schedule a visit. Patient will contact [employer] for transportation.     If you are experiencing a Mental Health or Behavioral Health Crisis or need someone to talk to, please call 911  Patient verbalizes understanding of instructions and care plan provided today and agrees to view in MyChart. Active MyChart status and patient understanding of how to access instructions and care plan via MyChart confirmed with patient.     No further follow up required: Patient does not request a follow up visit.  Tillman Gardener, BSW Social Worker [phone]

## 2023-12-18 DIAGNOSIS — B351 Tinea unguium: Secondary | ICD-10-CM | POA: Diagnosis not present

## 2024-01-07 DIAGNOSIS — L03031 Cellulitis of right toe: Secondary | ICD-10-CM | POA: Diagnosis not present

## 2024-01-07 DIAGNOSIS — M79671 Pain in right foot: Secondary | ICD-10-CM | POA: Diagnosis not present

## 2024-01-07 DIAGNOSIS — M79674 Pain in right toe(s): Secondary | ICD-10-CM | POA: Diagnosis not present

## 2024-01-07 DIAGNOSIS — L600 Ingrowing nail: Secondary | ICD-10-CM | POA: Diagnosis not present

## 2024-01-21 DIAGNOSIS — L03031 Cellulitis of right toe: Secondary | ICD-10-CM | POA: Diagnosis not present

## 2024-01-21 DIAGNOSIS — M79671 Pain in right foot: Secondary | ICD-10-CM | POA: Diagnosis not present

## 2024-01-21 DIAGNOSIS — L600 Ingrowing nail: Secondary | ICD-10-CM | POA: Diagnosis not present

## 2024-01-21 DIAGNOSIS — M79674 Pain in right toe(s): Secondary | ICD-10-CM | POA: Diagnosis not present

## 2024-02-19 ENCOUNTER — Telehealth: Payer: Self-pay

## 2024-02-19 NOTE — Telephone Encounter (Signed)
Scheduled next available appointment

## 2024-02-19 NOTE — Telephone Encounter (Signed)
 Patient was identified as falling into the [place] Measure - Diabetes.   Patient was: Left voicemail to schedule with primary care provider.   Please follow up to schedule appointment to address DM/A1C

## 2024-04-14 DIAGNOSIS — M79671 Pain in right foot: Secondary | ICD-10-CM | POA: Diagnosis not present

## 2024-04-14 DIAGNOSIS — M79674 Pain in right toe(s): Secondary | ICD-10-CM | POA: Diagnosis not present

## 2024-04-14 DIAGNOSIS — I739 Peripheral vascular disease, unspecified: Secondary | ICD-10-CM | POA: Diagnosis not present

## 2024-04-14 DIAGNOSIS — M79675 Pain in left toe(s): Secondary | ICD-10-CM | POA: Diagnosis not present

## 2024-04-14 DIAGNOSIS — M79672 Pain in left foot: Secondary | ICD-10-CM | POA: Diagnosis not present

## 2024-04-14 DIAGNOSIS — L609 Nail disorder, unspecified: Secondary | ICD-10-CM | POA: Diagnosis not present

## 2024-04-22 ENCOUNTER — Ambulatory Visit: Admitting: Family Medicine

## 2024-04-22 DIAGNOSIS — J3089 Other allergic rhinitis: Secondary | ICD-10-CM | POA: Diagnosis not present

## 2024-07-14 DIAGNOSIS — I739 Peripheral vascular disease, unspecified: Secondary | ICD-10-CM | POA: Diagnosis not present

## 2024-07-14 DIAGNOSIS — M79671 Pain in right foot: Secondary | ICD-10-CM | POA: Diagnosis not present

## 2024-07-14 DIAGNOSIS — M79672 Pain in left foot: Secondary | ICD-10-CM | POA: Diagnosis not present

## 2024-07-14 DIAGNOSIS — M79675 Pain in left toe(s): Secondary | ICD-10-CM | POA: Diagnosis not present

## 2024-07-14 DIAGNOSIS — M79674 Pain in right toe(s): Secondary | ICD-10-CM | POA: Diagnosis not present

## 2024-07-14 DIAGNOSIS — L609 Nail disorder, unspecified: Secondary | ICD-10-CM | POA: Diagnosis not present

## 2024-09-23 DIAGNOSIS — L989 Disorder of the skin and subcutaneous tissue, unspecified: Secondary | ICD-10-CM | POA: Diagnosis not present

## 2024-10-05 NOTE — Progress Notes (Addendum)
 DAWANA ASPER                                          MRN: [id number]   10/05/2024   The VBCI Quality Team Specialist reviewed this patient medical record for the purposes of chart review for care gap closure. The following were reviewed: chart review for care gap closure-glycemic status assessment and kidney health evaluation for diabetes:eGFR  and uACR.    VBCI Quality Team

## 2024-10-22 NOTE — Progress Notes (Signed)
   10/22/2024  Patient ID: Orlean VEAR Larve, female   DOB: 30-Oct-1956, 68 y.o.   MRN: [id number]  Pharmacy Quality Measure Review  This patient is appearing on a report for being at risk of failing the Glycemic Status Assessment in Diabetes measure this calendar year.   Patient lost to f/u, now scheduled 01/2024. Note made regarding uACR update needed.  Lab Results  Component Value Date   HGBA1C 13.2 (A) 06/08/2022   HGBA1C 6.8 (A) 07/19/2020   HGBA1C 7.5 (H) 03/07/2020   Lang Sieve, PharmD, BCGP Clinical Pharmacist  [phone]

## 2024-11-24 ENCOUNTER — Telehealth: Payer: Self-pay | Admitting: Family Medicine

## 2024-11-24 NOTE — Telephone Encounter (Signed)
 Copied from CRM [id number]. Topic: General - Other >> Nov 24, 2024 10:52 AM Avram MATSU wrote: Reason for CRM: Patient Np with optum stated her A1c is 13 even and wanted to let the provider know, please advise [phone] or [phone]

## 2024-12-08 ENCOUNTER — Ambulatory Visit: Payer: Self-pay

## 2024-12-08 ENCOUNTER — Telehealth: Payer: Self-pay | Admitting: "Endocrinology

## 2024-12-08 VITALS — BP 120/70 | HR 90 | Ht 64.0 in | Wt 153.0 lb

## 2024-12-08 DIAGNOSIS — E1165 Type 2 diabetes mellitus with hyperglycemia: Secondary | ICD-10-CM | POA: Diagnosis not present

## 2024-12-08 DIAGNOSIS — I10 Essential (primary) hypertension: Secondary | ICD-10-CM | POA: Diagnosis not present

## 2024-12-08 DIAGNOSIS — Z7984 Long term (current) use of oral hypoglycemic drugs: Secondary | ICD-10-CM

## 2024-12-08 MED ORDER — LANCET DEVICE MISC: 1.0000 | 0 refills | Status: AC

## 2024-12-08 MED ORDER — BLOOD GLUCOSE MONITORING SUPPL DEVI: 1.0000 | 0 refills | Status: AC

## 2024-12-08 MED ORDER — LANCETS MISC: 1.0000 | 0 refills | Status: AC

## 2024-12-08 MED ORDER — OLMESARTAN MEDOXOMIL 20 MG PO TABS: 20.0000 mg | ORAL_TABLET | Freq: Every day | ORAL | 1 refills | Status: AC

## 2024-12-08 MED ORDER — SITAGLIPTIN PHOSPHATE 50 MG PO TABS: 50.0000 mg | ORAL_TABLET | Freq: Every day | ORAL | 1 refills | Status: AC

## 2024-12-08 MED ORDER — BLOOD GLUCOSE TEST VI STRP: 1.0000 | ORAL_STRIP | 0 refills | Status: AC

## 2024-12-08 NOTE — Progress Notes (Signed)
 "  Established Patient Office Visit  Subjective   Patient ID: [name], female    DOB: 21-Sep-1956  Age: 69 y.o. MRN: [id number]  Chief Complaint  Patient presents with   Medical Management of Chronic Issues    Pt here for her Uncontrolled diabetes     HPI Discussed the use of AI scribe software for clinical note transcription with the patient, who gave verbal consent to proceed.  History of Present Illness    [name] is a 69 year old female with diabetes and hypertension who presents for follow-up.  Glycemic control - Diabetes mellitus managed with herbal supplements - Recent review indicated an A1c of 13 on recent home health evaluation.   Hypertension management - not currently on prescription blood pressure medication.  She prefers herbal supplements for blood pressure control.      Patient Active Problem List   Diagnosis Date Noted   AKI (acute kidney injury) 11/29/2023   DKA (diabetic ketoacidosis) (HCC) 10/01/2021   Dermatomycosis 05/29/2020   Weight loss, abnormal 05/29/2020   Gait disturbance 05/29/2020   Vitamin D  deficiency 01/29/2020   Cold intolerance 12/24/2019   Dry skin 12/24/2019   Constipation 12/24/2019   Arthritis 12/24/2019   Personal history of noncompliance with medical treatment, presenting hazards to health 08/05/2019   Non-adherence to medical treatment 08/15/2015   Nail fungus 11/05/2011   Malignant neoplasm of breast (female) (HCC) 11/28/2010   Abnormal mammogram 06/12/2010   Mixed hyperlipidemia 05/22/2010   Essential hypertension, benign 02/27/2008   Uncontrolled type 2 diabetes mellitus with hyperglycemia (HCC) 02/27/2008      ROS    Objective:     BP 120/70 (BP Location: Left Arm, Patient Position: Sitting, Cuff Size: Normal)   Pulse 90   Ht 5' 4 (1.626 m)   Wt 153 lb 0.6 oz (69.4 kg)   SpO2 95%   BMI 26.27 kg/m  BP Readings from Last 3 Encounters:  12/08/24 120/70  07/11/22 100/68  07/03/22 112/68   Wt  Readings from Last 3 Encounters:  12/08/24 153 lb 0.6 oz (69.4 kg)  07/11/22 165 lb 12.8 oz (75.2 kg)  07/03/22 153 lb 6.4 oz (69.6 kg)     Physical Exam Vitals and nursing note reviewed.  Constitutional:      Appearance: Normal appearance.  HENT:     Head: Normocephalic.  Eyes:     Extraocular Movements: Extraocular movements intact.     Pupils: Pupils are equal, round, and reactive to light.  Cardiovascular:     Rate and Rhythm: Normal rate and regular rhythm.  Pulmonary:     Effort: Pulmonary effort is normal.     Breath sounds: Normal breath sounds.  Musculoskeletal:     Cervical back: Normal range of motion and neck supple.  Neurological:     Mental Status: She is alert and oriented to person, place, and time.  Psychiatric:        Mood and Affect: Mood normal.        Thought Content: Thought content normal.      No results found for any visits on 12/08/24.  Last CBC Lab Results  Component Value Date   WBC 10.5 06/08/2022   HGB 13.0 06/08/2022   HCT 40.2 06/08/2022   MCV 80 06/08/2022   MCH 25.8 (L) 06/08/2022   RDW 14.3 06/08/2022   PLT 151 06/08/2022   Last metabolic panel Lab Results  Component Value Date   GLUCOSE 246 (H) 06/08/2022  NA 135 06/08/2022   K 4.1 06/08/2022   CL 99 06/08/2022   CO2 23 06/08/2022   BUN 11 06/08/2022   CREATININE 0.56 (L) 06/08/2022   EGFR 100 06/08/2022   CALCIUM  9.1 06/08/2022   PROT 6.8 06/08/2022   ALBUMIN 4.1 06/08/2022   LABGLOB 2.7 06/08/2022   AGRATIO 1.5 06/08/2022   BILITOT 0.4 06/08/2022   ALKPHOS 111 06/08/2022   AST 22 06/08/2022   ALT 22 06/08/2022   ANIONGAP 11 12/28/2019   Last lipids Lab Results  Component Value Date   CHOL 183 06/08/2022   HDL 42 06/08/2022   LDLCALC 121 (H) 06/08/2022   TRIG 112 06/08/2022   CHOLHDL 4.4 06/08/2022   Last hemoglobin A1c Lab Results  Component Value Date   HGBA1C 13.2 (A) 06/08/2022   Last thyroid  functions Lab Results  Component Value Date   TSH  1.410 06/08/2022   Last vitamin D  Lab Results  Component Value Date   VD25OH 35.9 06/08/2022   Last vitamin B12 and Folate Lab Results  Component Value Date   VITAMINB12 573 08/13/2007   FOLATE >20.0 ng/mL 08/13/2007      The 10-year ASCVD risk score (Arnett DK, et al., 2019) is: 18.4%    Assessment & Plan:   Problem List Items Addressed This Visit       Cardiovascular and Mediastinum   Essential hypertension, benign   Continue with olmesartan  20 for BP and kidney protection in setting of uncontrolled diabetes.        Relevant Medications   olmesartan  (BENICAR ) 20 MG tablet     Endocrine   Uncontrolled type 2 diabetes mellitus with hyperglycemia (HCC) - Primary   Type 2 diabetes mellitus with hyperglycemia Poor glycemic control with A1c of >14.  -Restart Januvia  at previous dose and refer back to endocrinology for further management.  She insists that she does not want needles and reports intolerance of Metformin  and glipizide .         Relevant Medications   sitaGLIPtin  (JANUVIA ) 50 MG tablet   olmesartan  (BENICAR ) 20 MG tablet   Other Relevant Orders   Bayer DCA Hb A1c Waived   Ambulatory referral to Endocrinology   Basic Metabolic Panel (BMET)   Urine Microalbumin w/creat. ratio    Return in about 3 months (around 03/08/2025) for chronic follow-up with PCP.    Leita Longs, FNP  "

## 2024-12-08 NOTE — Assessment & Plan Note (Signed)
 Continue with olmesartan  20 for BP and kidney protection in setting of uncontrolled diabetes.

## 2024-12-08 NOTE — Telephone Encounter (Signed)
 I got patient scheduled for next week. Can you send her in a meter with strips and lancets. She is not sure what is covered. [place] [employer]

## 2024-12-08 NOTE — Assessment & Plan Note (Signed)
 Type 2 diabetes mellitus with hyperglycemia Poor glycemic control with A1c of >14.  -Restart Januvia  at previous dose and refer back to endocrinology for further management.  She insists that she does not want needles and reports intolerance of Metformin  and glipizide .

## 2024-12-10 LAB — BASIC METABOLIC PANEL WITH GFR
BUN/Creatinine Ratio: 16 (ref 12–28)
BUN: 11 mg/dL (ref 8–27)
CO2: 24 mmol/L (ref 20–29)
Calcium: 9.7 mg/dL (ref 8.7–10.3)
Chloride: 97 mmol/L (ref 96–106)
Creatinine, Ser: 0.67 mg/dL (ref 0.57–1.00)
Glucose: 439 mg/dL — ABNORMAL HIGH (ref 70–99)
Potassium: 4.7 mmol/L (ref 3.5–5.2)
Sodium: 134 mmol/L (ref 134–144)
eGFR: 95 mL/min/1.73

## 2024-12-10 LAB — MICROALBUMIN / CREATININE URINE RATIO
Creatinine, Urine: 26.3 mg/dL
Microalb/Creat Ratio: 14 mg/g{creat} (ref 0–29)
Microalbumin, Urine: 3.7 ug/mL

## 2024-12-14 ENCOUNTER — Ambulatory Visit: Admitting: "Endocrinology

## 2024-12-14 NOTE — Telephone Encounter (Signed)
Pt no showed visit today 

## 2024-12-15 LAB — BAYER DCA HB A1C WAIVED: HB A1C (BAYER DCA - WAIVED): >14 % — ABNORMAL HIGH (ref 4.8–5.6)

## 2024-12-24 NOTE — Progress Notes (Signed)
 [name]                                          MRN: [id number]   12/24/2024   The VBCI Quality Team Specialist reviewed this patient medical record for the purposes of chart review for care gap closure. The following were reviewed: chart review for care gap closure-breast cancer screening.    VBCI Quality Team

## 2025-01-11 ENCOUNTER — Ambulatory Visit: Admitting: "Endocrinology

## 2025-01-13 ENCOUNTER — Ambulatory Visit: Admitting: Family Medicine

## 2025-01-27 ENCOUNTER — Ambulatory Visit: Admitting: Family Medicine
